# Patient Record
Sex: Male | Born: 1965 | Race: White | Hispanic: No | Marital: Married | Smoking: Former smoker
Health system: Western US, Academic
[De-identification: ages and names within clinical notes are randomized; demographics above are authoritative.]

## PROBLEM LIST (undated history)

## (undated) DIAGNOSIS — M47817 Spondylosis without myelopathy or radiculopathy, lumbosacral region: Secondary | ICD-10-CM

## (undated) DIAGNOSIS — G8929 Other chronic pain: Secondary | ICD-10-CM

## (undated) DIAGNOSIS — M1990 Unspecified osteoarthritis, unspecified site: Secondary | ICD-10-CM

## (undated) DIAGNOSIS — G4730 Sleep apnea, unspecified: Secondary | ICD-10-CM

## (undated) DIAGNOSIS — M549 Dorsalgia, unspecified: Secondary | ICD-10-CM

## (undated) DIAGNOSIS — I10 Essential (primary) hypertension: Secondary | ICD-10-CM

## (undated) DIAGNOSIS — M51379 Other intervertebral disc degeneration, lumbosacral region without mention of lumbar back pain or lower extremity pain: Secondary | ICD-10-CM

## (undated) DIAGNOSIS — M5137 Other intervertebral disc degeneration, lumbosacral region: Secondary | ICD-10-CM

## (undated) HISTORY — PX: SHOULDER SURGERY: SHX246

## (undated) HISTORY — DX: Essential (primary) hypertension: I10

## (undated) HISTORY — DX: Unspecified osteoarthritis, unspecified site: M19.90

## (undated) HISTORY — PX: POLYPECTOMY: SHX149

## (undated) HISTORY — DX: Other intervertebral disc degeneration, lumbosacral region without mention of lumbar back pain or lower extremity pain: M51.379

## (undated) HISTORY — DX: Other intervertebral disc degeneration, lumbosacral region: M51.37

## (undated) HISTORY — DX: Dorsalgia, unspecified: M54.9

## (undated) HISTORY — PX: VASECTOMY: SHX75

## (undated) HISTORY — DX: Other chronic pain: G89.29

## (undated) HISTORY — DX: Sleep apnea, unspecified: G47.30

## (undated) HISTORY — DX: Spondylosis without myelopathy or radiculopathy, lumbosacral region: M47.817

## (undated) HISTORY — PX: COLONOSCOPY: SHX174

---

## 1986-09-27 HISTORY — PX: KNEE SURGERY: SHX244

## 2002-06-01 ENCOUNTER — Encounter: Admission: RE | Admit: 2002-06-01 | Discharge: 2002-06-01 | Payer: Self-pay | Admitting: Family Medicine

## 2002-06-01 ENCOUNTER — Encounter: Payer: Self-pay | Admitting: Family Medicine

## 2002-09-17 ENCOUNTER — Encounter: Payer: Self-pay | Admitting: Neurosurgery

## 2002-09-17 ENCOUNTER — Encounter: Admission: RE | Admit: 2002-09-17 | Discharge: 2002-09-17 | Payer: Self-pay | Admitting: Neurosurgery

## 2002-10-01 ENCOUNTER — Encounter: Payer: Self-pay | Admitting: Neurosurgery

## 2002-10-01 ENCOUNTER — Encounter: Admission: RE | Admit: 2002-10-01 | Discharge: 2002-10-01 | Payer: Self-pay | Admitting: Neurosurgery

## 2002-10-15 ENCOUNTER — Encounter: Admission: RE | Admit: 2002-10-15 | Discharge: 2002-10-15 | Payer: Self-pay | Admitting: Neurosurgery

## 2002-10-15 ENCOUNTER — Encounter: Payer: Self-pay | Admitting: Neurosurgery

## 2003-10-09 ENCOUNTER — Encounter: Admission: RE | Admit: 2003-10-09 | Discharge: 2003-10-09 | Payer: Self-pay | Admitting: Neurosurgery

## 2003-10-24 ENCOUNTER — Encounter: Admission: RE | Admit: 2003-10-24 | Discharge: 2003-10-24 | Payer: Self-pay | Admitting: Neurosurgery

## 2003-11-08 ENCOUNTER — Encounter: Admission: RE | Admit: 2003-11-08 | Discharge: 2003-11-08 | Payer: Self-pay | Admitting: Neurosurgery

## 2004-07-12 ENCOUNTER — Emergency Department (HOSPITAL_COMMUNITY): Admission: EM | Admit: 2004-07-12 | Discharge: 2004-07-12 | Payer: Self-pay | Admitting: Emergency Medicine

## 2004-09-27 HISTORY — PX: KNEE SURGERY: SHX244

## 2004-11-24 ENCOUNTER — Ambulatory Visit: Payer: Self-pay | Admitting: Family Medicine

## 2004-11-25 ENCOUNTER — Encounter: Admission: RE | Admit: 2004-11-25 | Discharge: 2004-11-25 | Payer: Self-pay | Admitting: Family Medicine

## 2004-12-08 ENCOUNTER — Ambulatory Visit: Payer: Self-pay | Admitting: Family Medicine

## 2005-05-25 ENCOUNTER — Ambulatory Visit: Payer: Self-pay | Admitting: Family Medicine

## 2005-06-24 ENCOUNTER — Ambulatory Visit: Payer: Self-pay | Admitting: Family Medicine

## 2005-12-16 ENCOUNTER — Ambulatory Visit: Payer: Self-pay | Admitting: Family Medicine

## 2005-12-30 ENCOUNTER — Ambulatory Visit: Payer: Self-pay | Admitting: Family Medicine

## 2006-06-15 ENCOUNTER — Ambulatory Visit: Payer: Self-pay | Admitting: Family Medicine

## 2006-08-04 ENCOUNTER — Ambulatory Visit: Payer: Self-pay | Admitting: Family Medicine

## 2006-09-01 ENCOUNTER — Ambulatory Visit: Payer: Self-pay | Admitting: Family Medicine

## 2006-09-28 ENCOUNTER — Encounter: Payer: Self-pay | Admitting: Family Medicine

## 2006-09-28 ENCOUNTER — Ambulatory Visit (HOSPITAL_BASED_OUTPATIENT_CLINIC_OR_DEPARTMENT_OTHER): Admission: RE | Admit: 2006-09-28 | Discharge: 2006-09-28 | Payer: Self-pay | Admitting: Family Medicine

## 2006-10-06 ENCOUNTER — Ambulatory Visit: Payer: Self-pay | Admitting: Pulmonary Disease

## 2006-12-01 ENCOUNTER — Ambulatory Visit: Payer: Self-pay | Admitting: Family Medicine

## 2007-02-03 ENCOUNTER — Encounter: Payer: Self-pay | Admitting: Family Medicine

## 2007-05-30 ENCOUNTER — Telehealth: Payer: Self-pay | Admitting: Family Medicine

## 2007-06-06 ENCOUNTER — Ambulatory Visit: Payer: Self-pay | Admitting: Family Medicine

## 2007-06-06 DIAGNOSIS — R0609 Other forms of dyspnea: Secondary | ICD-10-CM

## 2007-06-06 DIAGNOSIS — R0989 Other specified symptoms and signs involving the circulatory and respiratory systems: Secondary | ICD-10-CM

## 2007-07-05 ENCOUNTER — Ambulatory Visit: Payer: Self-pay | Admitting: Pulmonary Disease

## 2007-11-21 ENCOUNTER — Ambulatory Visit: Payer: Self-pay | Admitting: Family Medicine

## 2007-11-21 DIAGNOSIS — F341 Dysthymic disorder: Secondary | ICD-10-CM | POA: Insufficient documentation

## 2008-04-01 ENCOUNTER — Ambulatory Visit: Payer: Self-pay | Admitting: Family Medicine

## 2008-05-20 ENCOUNTER — Telehealth: Payer: Self-pay | Admitting: Family Medicine

## 2008-05-20 ENCOUNTER — Ambulatory Visit: Payer: Self-pay | Admitting: Family Medicine

## 2008-12-05 ENCOUNTER — Ambulatory Visit: Payer: Self-pay | Admitting: Family Medicine

## 2008-12-12 ENCOUNTER — Ambulatory Visit: Payer: Self-pay | Admitting: Family Medicine

## 2008-12-15 LAB — CONVERTED CEMR LAB
Alkaline Phosphatase: 48 units/L (ref 39–117)
Bilirubin Urine: NEGATIVE
Bilirubin, Direct: 0.1 mg/dL (ref 0.0–0.3)
Eosinophils Absolute: 0.3 10*3/uL (ref 0.0–0.7)
GFR calc Af Amer: 94 mL/min
GFR calc non Af Amer: 78 mL/min
HCT: 45.3 % (ref 39.0–52.0)
HDL: 33.3 mg/dL — ABNORMAL LOW (ref >39.0)
Hemoglobin, Urine: NEGATIVE
MCV: 89.9 fL (ref 78.0–100.0)
Monocytes Absolute: 0.4 10*3/uL (ref 0.1–1.0)
Neutrophils Relative %: 66 % (ref 43.0–77.0)
Nitrite: NEGATIVE
Platelets: 191 10*3/uL (ref 150–400)
Potassium: 4.7 meq/L (ref 3.5–5.1)
RDW: 12.6 % (ref 11.5–14.6)
Sodium: 144 meq/L (ref 135–145)
Total Bilirubin: 1 mg/dL (ref 0.3–1.2)
Total Protein, Urine: NEGATIVE mg/dL
Triglycerides: 256 mg/dL (ref 0–149)
Urine Glucose: NEGATIVE mg/dL
Urobilinogen, UA: 0.2 (ref 0.0–1.0)
VLDL: 51 mg/dL — ABNORMAL HIGH (ref 0–40)

## 2009-06-03 ENCOUNTER — Ambulatory Visit: Payer: Self-pay | Admitting: Family Medicine

## 2009-06-03 DIAGNOSIS — M722 Plantar fascial fibromatosis: Secondary | ICD-10-CM

## 2009-06-03 DIAGNOSIS — M1990 Unspecified osteoarthritis, unspecified site: Secondary | ICD-10-CM | POA: Insufficient documentation

## 2009-06-03 DIAGNOSIS — IMO0002 Reserved for concepts with insufficient information to code with codable children: Secondary | ICD-10-CM

## 2009-06-03 DIAGNOSIS — G4730 Sleep apnea, unspecified: Secondary | ICD-10-CM

## 2009-09-24 ENCOUNTER — Ambulatory Visit: Payer: Self-pay | Admitting: Family Medicine

## 2009-09-24 DIAGNOSIS — R599 Enlarged lymph nodes, unspecified: Secondary | ICD-10-CM | POA: Insufficient documentation

## 2009-09-24 DIAGNOSIS — M954 Acquired deformity of chest and rib: Secondary | ICD-10-CM | POA: Insufficient documentation

## 2009-09-30 ENCOUNTER — Telehealth: Payer: Self-pay | Admitting: Family Medicine

## 2009-10-03 ENCOUNTER — Ambulatory Visit: Payer: Self-pay | Admitting: Family Medicine

## 2009-10-24 ENCOUNTER — Emergency Department (HOSPITAL_COMMUNITY): Admission: EM | Admit: 2009-10-24 | Discharge: 2009-10-24 | Payer: Self-pay | Admitting: Emergency Medicine

## 2009-12-10 ENCOUNTER — Ambulatory Visit: Payer: Self-pay | Admitting: Family Medicine

## 2009-12-10 LAB — CONVERTED CEMR LAB
Bilirubin Urine: NEGATIVE
Blood in Urine, dipstick: NEGATIVE
Glucose, Urine, Semiquant: NEGATIVE
Ketones, urine, test strip: NEGATIVE
Protein, U semiquant: NEGATIVE
pH: 6

## 2009-12-16 ENCOUNTER — Ambulatory Visit: Payer: Self-pay | Admitting: Family Medicine

## 2009-12-16 LAB — CONVERTED CEMR LAB
BUN: 15 mg/dL (ref 6–23)
Basophils Absolute: 0 10*3/uL (ref 0.0–0.1)
Bilirubin, Direct: 0 mg/dL (ref 0.0–0.3)
Chloride: 108 meq/L (ref 96–112)
Cholesterol: 217 mg/dL — ABNORMAL HIGH (ref 0–200)
Creatinine, Ser: 1 mg/dL (ref 0.4–1.5)
Direct LDL: 118.1 mg/dL
Eosinophils Absolute: 0.2 10*3/uL (ref 0.0–0.7)
Eosinophils Relative: 3 % (ref 0.0–5.0)
MCHC: 34.7 g/dL (ref 30.0–36.0)
MCV: 91.7 fL (ref 78.0–100.0)
Monocytes Absolute: 0.5 10*3/uL (ref 0.1–1.0)
Neutrophils Relative %: 66.3 % (ref 43.0–77.0)
Platelets: 204 10*3/uL (ref 150.0–400.0)
Total Bilirubin: 0.8 mg/dL (ref 0.3–1.2)
VLDL: 83.2 mg/dL — ABNORMAL HIGH (ref 0.0–40.0)
WBC: 7.6 10*3/uL (ref 4.5–10.5)

## 2010-09-24 ENCOUNTER — Ambulatory Visit: Payer: Self-pay | Admitting: Family Medicine

## 2010-09-24 DIAGNOSIS — L919 Hypertrophic disorder of the skin, unspecified: Secondary | ICD-10-CM

## 2010-09-24 DIAGNOSIS — M545 Low back pain, unspecified: Secondary | ICD-10-CM | POA: Insufficient documentation

## 2010-09-24 DIAGNOSIS — L909 Atrophic disorder of skin, unspecified: Secondary | ICD-10-CM | POA: Insufficient documentation

## 2010-09-24 DIAGNOSIS — M7710 Lateral epicondylitis, unspecified elbow: Secondary | ICD-10-CM | POA: Insufficient documentation

## 2010-10-27 NOTE — Assessment & Plan Note (Signed)
Summary: CPX // RS   Vital Signs:  Patient profile:   45 year old male Height:      75.5 inches Weight:      279 pounds BMI:     34.54 O2 Sat:      98 % Temp:     98.1 degrees F Pulse rate:   71 / minute BP sitting:   120 / 80  (left arm) Cuff size:   large  Vitals Entered By: Pura Spice, RN (December 16, 2009 8:33 AM) CC: cpx rt eye hemorrhage since last friday. talk about lexapro states  left  fx fibula about 8 wks ago tripped over log splitter.  Is Patient Diabetic? No   History of Present Illness: this 45 year old white married male and a for a complete physical examination and to discuss his medical problems. 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 He has had some family stresses with anxiety and panic episodes which have been relieved with alprazolam in the past but he does not utilize any at this time. We have continued to take Lexapro,which we are to change to citalopram 40 mg due  to financial reasons He was referred for a sleep study because of snoring and possible sleep apnea and was treated with a CPAP which had been her satisfactory He complains of some joint pain in her right knee which is relieved without approach and a milligram t.i.d. Plantar fasciitis in the past but has been doing stretching exercises and has been doing well Relates he noticed redness in her right eye this a.m.  Allergies (verified): No Known Drug Allergies  Past History:  Past Surgical History: Last updated: 02/03/2007 Vasectomy  Risk Factors: Smoking Status: quit (02/03/2007)  Past Medical History: Unremarkable chronic back pain  Review of Systems  The patient denies anorexia, fever, weight loss, weight gain, vision loss, decreased hearing, hoarseness, chest pain, syncope, dyspnea on exertion, peripheral edema, prolonged cough, headaches, hemoptysis, abdominal pain,  melena, hematochezia, severe indigestion/heartburn, hematuria, incontinence, genital sores, muscle weakness, suspicious skin lesions, transient blindness, difficulty walking, depression, unusual weight change, abnormal bleeding, enlarged lymph nodes, angioedema, breast masses, and testicular masses.    Physical Exam  General:  Well-developed,well-nourished,in no acute distress; alert,appropriate and cooperative throughout examinationoverweight-appearing.   Head:  Normocephalic and atraumatic without obvious abnormalities. No apparent alopecia or balding. Eyes:  gynecology hemorrhage or otherwise normal Ears:  External ear exam shows no significant lesions or deformities.  Otoscopic examination reveals clear canals, tympanic membranes are intact bilaterally without bulging, retraction, inflammation or discharge. Hearing is grossly normal bilaterally. Nose:  nasal septum deviated her weight passages close Mouth:  Oral mucosa and oropharynx without lesions or exudates.  Teeth in good repair. Neck:  No deformities, masses, or tenderness noted. Chest Wall:  No deformities, masses, tenderness or gynecomastia noted. Breasts:  No masses or gynecomastia noted Lungs:  Normal respiratory effort, chest expands symmetrically. Lungs are clear to auscultation, no crackles or wheezes. Heart:  Normal rate and regular rhythm. S1 and S2 normal without gallop, murmur, click, rub or other extra sounds. Abdomen:  Bowel sounds positive,abdomen soft and non-tender without masses, organomegaly or hernias noted. Rectal:  No external abnormalities noted. Normal sphincter tone. No rectal masses or tenderness. Genitalia:  Testes bilaterally descended without nodularity, tenderness or masses. No scrotal masses or lesions. No penis lesions or urethral discharge. Prostate:  Prostate gland  firm and smooth, no enlargement, nodularity, tenderness, mass, asymmetry or induration. Msk:  No deformity or scoliosis noted of thoracic or  lumbar spine.   Pulses:  R and L carotid,radial,femoral,dorsalis pedis and posterior tibial pulses are full and equal bilaterally Extremities:  No clubbing, cyanosis, edema, or deformity noted with normal full range of motion of all joints.   Neurologic:  No cranial nerve deficits noted. Station and gait are normal. Plantar reflexes are down-going bilaterally. DTRs are symmetrical throughout. Sensory, motor and coordinative functions appear intact. Skin:  Intact without suspicious lesions or rashes Cervical Nodes:  No lymphadenopathy noted Axillary Nodes:  No palpable lymphadenopathy Inguinal Nodes:  No significant adenopathy Psych:  Cognition and judgment appear intact. Alert and cooperative with normal attention span and concentration. No apparent delusions, illusions, hallucinations   Impression & Recommendations:  Problem # 1:  PHYSICAL EXAMINATION (ICD-V70.0) Assessment New  Orders: EKG w/ Interpretation (93000)normal EKG  Problem # 2:  SLEEP APNEA (ICD-780.57) Assessment: Improved  Problem # 3:  FASCIITIS, PLANTAR (ICD-728.71) Assessment: Improved  His updated medication list for this problem includes:    Ibuprofen 800 Mg Tabs (Ibuprofen) .[name]... 1 three times a day pc for arthrits knee and  back stretching exercises on a regular basis  Problem # 4:  ARTHRITIS, RIGHT KNEE (ICD-716.96) Assessment: Unchanged  Problem # 5:  ANXIETY DEPRESSION (ICD-300.4) Assessment: Improved citalopram 40 mg q. day  Problem # 6:  SNORING (ICD-786.09) Assessment: Improved  Problem # 7:  BACK PAIN (ICD-724.5) Assessment: Improved  His updated medication list for this problem includes:    Lorcet 10/650 10-650 Mg Tabs (Hydrocodone-acetaminophen) .[name]... 1  by mouth every 4-6 hrs as needed pain  not to exceed 4 per day    Ibuprofen 800 Mg Tabs (Ibuprofen) .[name]... 1 three times a day pc for arthrits knee and  back  Complete Medication List: 1)  Lorcet 10/650 10-650 Mg Tabs  (Hydrocodone-acetaminophen) .[name].. 1  by mouth every 4-6 hrs as needed pain  not to exceed 4 per day 2)  Ibuprofen 800 Mg Tabs (Ibuprofen) .[name].. 1 three times a day pc for arthrits knee and  back 3)  Citalopram Hydrobromide 20 Mg Tabs (Citalopram hydrobromide) .[name].. 1 qd  Patient Instructions: 1)  James Lexapro to citalopram 40 mg q. day 2)  Since you again from 265-7 days I recommend that you go on a weight reduction diet and lose weight., since she is so active that from her way to do this to decrease your caloric intake 3)  Continue medications as aprescribed 4)  as discussed her laboratory studies were good except for elevated triglycerides which will lower it to decrease her sweets and caloric intake Prescriptions: IBUPROFEN 800 MG TABS (IBUPROFEN) 1 three times a day pc for arthrits knee and  back  #90 x 11   Entered and Authorized by:   Judithann Sheen MD   Signed by:   Judithann Sheen MD on 12/16/2009   Method used:   Electronically to        [id number]* (retail)       [address]       [place], [place]  [address]       Ph: [phone] or [phone]       Fax: [phone]   RxID:   [id number] CITALOPRAM HYDROBROMIDE 20 MG TABS (CITALOPRAM HYDROBROMIDE) 1 qd  #30 x 11   Entered and  Authorized by:   Judithann Sheen MD   Signed by:   Judithann Sheen MD on 12/16/2009   Method used:   Electronically to        [id number]* (retail)       [address]       [place], [place]  [address]       Ph: [phone] or [phone]       Fax: [phone]   RxID:   [id number] LORCET 10/650 10-650 MG  TABS (HYDROCODONE-ACETAMINOPHEN) 1  by mouth every 4-6 hrs as needed pain  not to exceed 4 per day  #120 x 5   Entered and Authorized by:   Judithann Sheen MD   Signed by:   Judithann Sheen MD on 12/16/2009   Method used:   Print then Give to Patient   RxID:    [id number]

## 2010-10-27 NOTE — Progress Notes (Signed)
Summary: lymph node swollen more & irritated  Phone Note Call from Patient Call back at [phone]   Summary of Call: Lymph node under right arm swollen more & irritated.  Saw Dr. Scotty Court last week.  Make another appt or what?  Patient called again & left voicemail same info.  Lymph node more swollen & significantly tender.  Triage called & left message that we don't have answer yet.   Rudy Jew, RN  September 30, 2009 2:56 PM  Initial call taken by: Rudy Jew, RN,  September 30, 2009 12:36 PM  Follow-up for Phone Call        per dr Scotty Court he called pt and sent in cipro  . Follow-up by: Pura Spice, RN,  October 01, 2009 7:42 AM    New/Updated Medications: CIPROFLOXACIN HCL 500 MG TABS (CIPROFLOXACIN HCL) 1 by mouth two times a day Prescriptions: CIPROFLOXACIN HCL 500 MG TABS (CIPROFLOXACIN HCL) 1 by mouth two times a day  #20 x 0   Entered by:   Pura Spice, RN   Authorized by:   Judithann Sheen MD   Signed by:   Pura Spice, RN on 10/01/2009   Method used:   Telephoned to ...       [facility]  [address] [id number] (retail)       [address]       [place], [place]  [address]       Ph: [phone] or [phone]       Fax: [phone]   RxID:   [id number]

## 2010-10-29 NOTE — Assessment & Plan Note (Signed)
Summary: BACK PAIN, L ELBOW PAIN - OK DR STAFFORD? 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 His emotional stress and strain with his family has greatly improved and he does not need the Celexa and a longer He is also stopped taking ibuprofen was needing and back over 130/118. New complaint is that of pain in the left elbow lateral aspect of the elbow Otherwise doing fine,except as noted he is staying approximately 10 pounds in related that he should go on a weight reduction diet relates he has several small skin tags under both arms in the axial  Allergies: No Known Drug Allergies  Past History:  Past Medical History: Last updated: 12/16/2009 Unremarkable chronic back pain  Past Surgical History: Last updated: 02/03/2007 Vasectomy  Risk Factors: Smoking Status: quit (02/03/2007)  Physical Exam  General:  Well-developed,well-nourished,in no acute distress; alert,appropriate and cooperative throughout examination Chest Wall:  left sternoclavicular door appears to been displaced in the past and is larger slightly lower than the right sternoclavicular joint Lungs:  Normal respiratory effort, chest expands symmetrically. Lungs are clear to auscultation, no crackles or wheezes. Heart:  Normal rate and regular rhythm. S1 and S2 normal without gallop, murmur, click, rub or  other extra sounds. Msk:  No deformity or scoliosis noted of thoracic or lumbar spine.   S. Mark tenderness over the left epicondyle region  Extremities:  No clubbing, cyanosis, edema, or deformity noted with normal full range of motion of all joints.   Skin:  patient has a left 4 small skin tags in the right axilla and 3 in the left axilla which can be removed surgically by removing with scissors and silver nitrate   Impression & Recommendations:  Problem # 1:  SKIN TAG (ICD-701.9) Assessment New removed approximately 8 skin tags with scissors and then applied silver nitrate  Problem # 2:  BACK PAIN, LUMBAR, CHRONIC (ICD-724.2) Assessment: Improved  His updated medication list for this problem includes:    Lorcet 10/650 10-650 Mg Tabs (Hydrocodone-acetaminophen) .[name]... 1  by mouth every 4-6 hrs as needed pain  not to exceed 4 per day, refill on schedule    Ibuprofen 800 Mg Tabs (Ibuprofen) .[name]... 1 three times a day pc for arthrits knee and  back  Problem # 3:  LATERAL EPICONDYLITIS, LEFT (ICD-726.32) Assessment: New redness on decreasing dosage tennis elbow strap  Problem # 4:  SLEEP APNEA (ICD-780.57) Assessment: Improved CPAP  Problem # 5:  FASCIITIS, PLANTAR (ICD-728.71) Assessment: Improved  His updated medication list for this problem includes:    Ibuprofen 800 Mg Tabs (Ibuprofen) .[name]... 1 three times a day pc for arthrits knee and  back  Problem # 6:  ARTHRITIS, RIGHT KNEE (ICD-716.96) Assessment: Improved  Complete Medication List: 1)  Lorcet 10/650 10-650 Mg Tabs (Hydrocodone-acetaminophen) .[name].. 1  by mouth every 4-6 hrs as needed pain  not to exceed 4 per day, refill on schedule 2)  Ibuprofen 800 Mg Tabs (Ibuprofen) .[name].. 1 three times a day pc for arthrits knee and  back 3)  Citalopram Hydrobromide 20 Mg Tabs (Citalopram hydrobromide) .[name].. 1 qd 4)  Prednisone 20 Mg Tabs (Prednisone) .[name].. 1 tidpc for 4 days then 1 two times a day for7 days then 1 once daily  for  tendinitis  Patient Instructions: 1)  DIAGNOSIS OF LATERAL EPICONDYLITIS LEFT ELBOW 2)  prednisone decreasing dosage plus getting a tennis elbow splint or band when doing lifting or repetitve activities 3)  will refill hydrocodone 4)  skin polp areas should be no problem Prescriptions: PREDNISONE 20 MG TABS (PREDNISONE) 1 tidpc for 4 days then 1 two times a day for7 days then 1 once daily  for tendinitis  #36 x 1   Entered and Authorized by:   Judithann Sheen MD   Signed by:   Judithann Sheen MD on 09/24/2010   Method used:   Electronically to        [id number]* (retail)       [address]       [place], [place]  [address]       Ph: [phone] or [phone]       Fax: [phone]   RxID:   [id number] LORCET 10/650 10-650 MG  TABS (HYDROCODONE-ACETAMINOPHEN) 1  by mouth every 4-6 hrs as needed pain  not to exceed 4 per day, refill on schedule  #120 x 5   Entered and Authorized by:   Judithann Sheen MD   Signed by:   Judithann Sheen MD on 09/24/2010   Method used:   Print then Give to Patient   RxID:   [id number]    Orders Added: 1)  Est. Patient Level IV [95188]

## 2011-02-09 NOTE — Assessment & Plan Note (Signed)
[facility]                             PULMONARY OFFICE NOTE   BRASEN, BUNDREN                       MRN:          [id number]  DATE:07/05/2007                            DOB:          08-Apr-1966    HISTORY OF PRESENT ILLNESS:  Patient is a 45 year old white male who I  have been asked to see for sleeping difficulties.  The patient states  that he has been a snorer all of his life but this has greatly worsened  recently.  Patient is noted to have pauses in his breathing during sleep  according to his wife, he does admit to choking arousals.  He believes  that things are definitely worse while on his back.  Patient typically  gets to bed between 9 and 10 and gets up at 5 a.m. to start his day.  He  will awaken with a raw, dry mouth and does not feel rested.  He has  frequent awakenings during the night for unknown reasons.  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  He has no frank sleepiness just worsening fuzziness.  He has definite dozing with TV and movies in the evenings.  He has no  difficulties with driving.  Patient does admit that he has significant  nasal stuffiness and congestion and is somewhat improved with Nasacort  but not totally relieved.   PAST MEDICAL HISTORY:  Spine disk disease, otherwise noncontributory.   CURRENT MEDICATIONS:  1. Alprazolam 0.25 mg nightly.  2. Hydroxycodone for back pain p.r.n.  3. Lexapro 10 mg daily.   PATIENT HAS NO KNOWN DRUG ALLERGIES.   SOCIAL HISTORY:  Is married and has children.  Has a history of smoking  1 pack per day for 20 years.  He has not smoked since 2004.   FAMILY HISTORY:  Remarkable for his father having emphysema, otherwise  it is unremarkable in first degree relatives.   REVIEW OF SYSTEMS:  As per history of present illness, also see patient  intake form documented in the chart.   PHYSICAL EXAMINATION:  GENERAL:  He is  an overweight male in no acute  distress, blood pressure is 132/86, pulse 83, temperature is 98, weight  is 264 pounds, he is 6 foot 3-1/2 inches tall, O2 saturation on room air  is 94%.  HEENT:  Pupils equal, round, reactive to light and accommodation;  extraocular muscles are intact.  Nares shows deviated septum to the  right with obstruction.  His left naris is narrowed and has turbinate  hypertrophy.  Oropharynx shows elongation of his soft palate and uvula.  NECK:  Supple without JVD or lymphadenopathy.  There is no palpitations  thyromegaly.  CHEST:  Totally clear.  CARDIAC:  Reveals regular rate and rhythm, no murmurs, rubs or gallops.  ABDOMEN:  Soft, nontender with good bowel sounds.  Genital exam, rectal exam, breast exam was not done and not indicated.  LOWER EXTREMITIES:  Without edema, pulses were intact distally.  NEUROLOGIC:  Alert and oriented with no obvious motor deficits.  LABORATORY DATA:  Patient underwent nocturnal polysomnography on January  of 2008 where he was found to have 4 apneas and 6 hypopneas for  respiratory disturbance index of 1.5 events per hour.  There was loud  snoring noted throughout.  Patient also had large numbers of nonspecific  arousals which is suggestive of the upper airway resistance syndrome.   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  The patient  gives a classic history for this, has nocturnal polysomnography which  suggests this as well, and has abnormal upper airway anatomy.  I have  discussed the various ways that we can deal with this including weight  loss, upper airway surgery, oral appliance and also continuous positive  airway pressure if we are able to get it approved by insurance.  The  patient is not a candidate for oral appliance because of his significant  nasal obstruction.  We will start him back on Nasacort but this did not  relieve his symptoms in the past.  I do think that we ought to look at 2   possible treatments.  One is to consider nasopharyngeal surgery while he  is losing weight.  The other is to consider continuous positive airway  pressure which I think would be quite effective for him.  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  Even when the patient's weight was lower he  continued to have this issue and therefore I do not think weight loss  alone is the best intervention.   PLAN:  1. Work on weight loss.  2. Nasacort 2 sprays in each naris daily.  3. Will refer to ENT for evaluation for possible surgery.  4. In the interim I really think that we need to try CPAP to see is      this will help him.  I believe we need to treat his sleep disorder      breathing aggressively in light of his occupation which may result      in high risk if he falls asleep.  Patient is agreeable to this      approach.     Barbaraann Share, MD,FCCP  Electronically Signed    [facility]  DD: 07/06/2007  DT: 07/06/2007  Job #: [id number]   cc:   Ellin Saba., MD  [facility]

## 2011-02-12 NOTE — Procedures (Signed)
NAME:  Dylan Hatfield, Dylan Hatfield NO.:  [id number]   MEDICAL RECORD NO.:  [id number]          PATIENT TYPE:  OUT   LOCATION:  SLEEP CENTER                 FACILITY:  [facility]   PHYSICIAN:  Barbaraann Share, MD,FCCPDATE OF BIRTH:  1964/11/27   DATE OF STUDY:  09/28/2006                            NOCTURNAL POLYSOMNOGRAM   REFERRING PHYSICIAN:  Tawny Asal MD   INDICATION FOR STUDY:  Hypersomnia with sleep apnea.   EPWORTH SLEEPINESS SCORE:  11.   SLEEP ARCHITECTURE:  The patient had a total sleep time of 438 minutes  with 84 minutes of REM and never achieved slow-wave sleep.  Sleep onset  latency was normal, as was REM onset.  Sleep efficiency was fairly good  at 92%.   RESPIRATORY DATA:  The patient was found to have 4 apneas and 6  hypopneas for a respiratory disturbance index of 1.5 events per hour.  The events primarily occurred in the supine position, and there was loud  snoring noted throughout.  It should be noted that the patient had 106  nonspecific arousals during the night as well.   OXYGEN DATA:  There was saturation as low as 86% with the obstructive  events.   CARDIAC DATA:  No clinically significant cardiac arrhythmias.   MOVEMENT-PARASOMNIA:  The patient was found to have 16 leg jerks with  very little sleep disruption.   IMPRESSIONS-RECOMMENDATIONS:  Small numbers of obstructive events which  do not meet the respiratory disturbance criteria for the obstructive  sleep apnea syndrome.  The patient did, however, have loud snoring and  very large numbers of nonspecific arousals, which is suggestive of the  upper airway resistance syndrome.  This is a pre-sleep apnea condition  that may be treated with weight loss alone if applicable, upper airway  surgery, or appliance, or possibly CPAP.  Clinical correlation is  suggested.      Barbaraann Share, MD,FCCP  Diplomate, American Board of Sleep  Medicine     [facility]  D:  10/06/2006 17:44:17  T:   10/07/2006 06:52:45  Job:  [id number]

## 2011-03-01 ENCOUNTER — Other Ambulatory Visit: Payer: Self-pay | Admitting: Family Medicine

## 2011-03-19 ENCOUNTER — Encounter: Payer: Self-pay | Admitting: Family Medicine

## 2011-03-24 ENCOUNTER — Encounter: Payer: Self-pay | Admitting: Family Medicine

## 2011-03-24 ENCOUNTER — Ambulatory Visit (INDEPENDENT_AMBULATORY_CARE_PROVIDER_SITE_OTHER): Payer: BC Managed Care – PPO | Admitting: Family Medicine

## 2011-03-24 ENCOUNTER — Ambulatory Visit (INDEPENDENT_AMBULATORY_CARE_PROVIDER_SITE_OTHER)
Admission: RE | Admit: 2011-03-24 | Discharge: 2011-03-24 | Disposition: A | Discharge status: Home / Self Care | Payer: BC Managed Care – PPO | Source: Ambulatory Visit | Attending: Family Medicine | Admitting: Family Medicine

## 2011-03-24 VITALS — BP 112/72 | HR 76 | Temp 98.2°F | Wt 262.0 lb

## 2011-03-24 DIAGNOSIS — M19079 Primary osteoarthritis, unspecified ankle and foot: Secondary | ICD-10-CM

## 2011-03-24 DIAGNOSIS — M25579 Pain in unspecified ankle and joints of unspecified foot: Secondary | ICD-10-CM

## 2011-03-24 MED ORDER — HYDROCODONE-ACETAMINOPHEN 10-650 MG PO TABS: ORAL_TABLET | ORAL | Status: DC

## 2011-03-24 MED ORDER — TERBINAFINE HCL 250 MG PO TABS: 250.0000 mg | ORAL_TABLET | Freq: Every day | ORAL | Status: DC

## 2011-03-24 MED ORDER — DOXYCYCLINE HYCLATE 100 MG PO TABS: 100.0000 mg | ORAL_TABLET | Freq: Two times a day (BID) | ORAL | Status: AC

## 2011-03-27 ENCOUNTER — Encounter: Payer: Self-pay | Admitting: Family Medicine

## 2011-03-27 NOTE — Progress Notes (Signed)
  Subjective:    Patient ID: Dylan Hatfield, male    DOB: [birth year], 45 y.o.   MRN: [id number] this is a 45 year old white married male is in today complaining of pain on the left ankle over the past week this followed an episode in which he twisted his ankle and put considerable pressure on the ankle. He's concerned since he had a left lower fracture of the left tibia December 2010 treated by Dr. Lestine Box Next is the patient has lost from 275-62 and looked for goodHe continues to have a chronic back pain and right knee pain since starting his CPAP for sleep apnea he has stopped snoring his left lateral epicondylitis and unresolved he does continue to have chronic lumbar back painHPI    Review of Systemssee history of present illness     Objective:   Physical Examthe patient is well-developed well-nourished pleasant cough due to white male who is complaining of pain in his left ankle Heart-lung no abnormalities noted Examination right ankle was normal, examination of the left ankle reveals it to be slightly swollen tenderness is not over the bone no blood is over the interspace of the ankle indicating inflammation no fracture        Assessment & Plan:  My impression is that he has a acute sprain or strain of the left ankle but no fracture I have her we'll send him to lumbar x-ray on [address] for ankle x-ray Plan to treat with t a neoprene support and minimize activity also 2 use cold pack 20-30 minutes and then apply heating pad 20 a 30 minute Already has analgesic of hydrocodone which she can use for pain when needed also to continue ibuprofen 800 mg 3 times daily

## 2011-03-27 NOTE — Patient Instructions (Addendum)
I am going to send you [facility] on [address] for a xray of left ankle My impression is that you have an acute strain sprain of the left ankle with no fracture however we will check on this If there is no fracture we'll treat her with a neoprene support if at all possible minimize being on ankle If possible use cold packs 20-30 minutes followed by heat 20-30 minutes 2-3 times per day Take hydrocodone as needed If there is a fracture I will refer you to an orthopedist, Dr. Lestine Box Today we'll give you an injection of Depo-Medrol 120 mg IM

## 2011-03-30 DIAGNOSIS — M19079 Primary osteoarthritis, unspecified ankle and foot: Secondary | ICD-10-CM

## 2011-03-30 MED ORDER — METHYLPREDNISOLONE ACETATE 80 MG/ML IJ SUSP
120.0000 mg | Freq: Once | INTRAMUSCULAR | Status: AC
  Administered 2011-03-30: 120 mg via INTRAMUSCULAR

## 2011-03-30 NOTE — Progress Notes (Signed)
Addended by: Azucena Freed on: 03/30/2011 03:17 PM   Modules accepted: Orders

## 2011-04-02 ENCOUNTER — Other Ambulatory Visit: Payer: Self-pay | Admitting: Family Medicine

## 2011-08-24 ENCOUNTER — Other Ambulatory Visit: Payer: Self-pay | Admitting: *Deleted

## 2011-08-24 MED ORDER — LEVOTHYROXINE SODIUM 200 MCG PO TABS: 200.0000 ug | ORAL_TABLET | Freq: Every day | ORAL | Status: DC

## 2011-09-03 ENCOUNTER — Telehealth: Payer: Self-pay

## 2011-09-03 NOTE — Telephone Encounter (Signed)
Synthroid was sent in for pt and he does not have thyroid disease.  Rx canceled and pt is aware.

## 2011-09-07 ENCOUNTER — Ambulatory Visit (INDEPENDENT_AMBULATORY_CARE_PROVIDER_SITE_OTHER): Payer: BC Managed Care – PPO | Admitting: Internal Medicine

## 2011-09-07 VITALS — BP 120/82 | HR 72 | Temp 98.4°F | Ht 76.0 in | Wt 275.0 lb

## 2011-09-07 DIAGNOSIS — J029 Acute pharyngitis, unspecified: Secondary | ICD-10-CM

## 2011-09-07 DIAGNOSIS — M545 Low back pain: Secondary | ICD-10-CM

## 2011-09-07 DIAGNOSIS — J069 Acute upper respiratory infection, unspecified: Secondary | ICD-10-CM

## 2011-09-07 NOTE — Progress Notes (Signed)
  Subjective:    Patient ID: Dylan Hatfield, male    DOB: 1966-03-29, 45 y.o.   MRN: [id number]  HPI One-week history of sore throat, left ear ache, symptoms were increasing until today when he feels a slightly better.   Past Medical History  Diagnosis Date  . Chronic back pain   . Arthritis   . Sleep apnea       Review of Systems No fever or chills No nausea vomiting No chest congestion or cough    Objective:   Physical Exam  Constitutional: He appears well-developed and well-nourished. No distress.  HENT:  Head: Normocephalic and atraumatic.       Face is symmetric, not tender to palpation. Nose is slightly congested, throat without redness, tympanic membranes bulge but no redness or discharge  Cardiovascular: Normal rate, regular rhythm and normal heart sounds.   No murmur heard. Pulmonary/Chest: Effort normal and breath sounds normal. No respiratory distress. He has no wheezes. He has no rales.  Skin: He is not diaphoretic.       Assessment & Plan:  URI: See instructions, strep test was negative

## 2011-09-07 NOTE — Assessment & Plan Note (Signed)
No history of back pain, his primary doctor is retiring, needs a new PCP. He has a one-month supply of pain medication. Plan: Needs office visit within a month to get established. If unable to see me in one month, I will call one-month supply of pain medication when needed until he is able to come back

## 2011-09-07 NOTE — Patient Instructions (Addendum)
Rest, fluids , tylenol For cough, take Mucinex DM twice a day as needed  For congestion use Sudafed (pseudoephedrine) behind the counter 30 mg every 4 to 6 hours as needed Call if no better in few days Call anytime if the symptoms are severe  Please make an appointment to get establish

## 2011-09-20 ENCOUNTER — Ambulatory Visit: Payer: BC Managed Care – PPO | Admitting: Internal Medicine

## 2011-09-22 ENCOUNTER — Encounter: Payer: Self-pay | Admitting: Family Medicine

## 2011-09-24 ENCOUNTER — Ambulatory Visit (INDEPENDENT_AMBULATORY_CARE_PROVIDER_SITE_OTHER): Payer: BC Managed Care – PPO | Admitting: Internal Medicine

## 2011-09-24 ENCOUNTER — Encounter: Payer: Self-pay | Admitting: Internal Medicine

## 2011-09-24 VITALS — BP 128/84 | HR 69 | Temp 98.3°F | Ht 75.5 in | Wt 268.2 lb

## 2011-09-24 DIAGNOSIS — M545 Low back pain: Secondary | ICD-10-CM

## 2011-09-24 MED ORDER — HYDROCODONE-ACETAMINOPHEN 10-650 MG PO TABS: ORAL_TABLET | ORAL | Status: DC

## 2011-09-24 NOTE — Assessment & Plan Note (Addendum)
Long history of back pain, about 6 years ago he had a full evaluation included a MRI according to the patient, he also got some epidural injections (2005 per chart review). Lately, the treatment has consisted essentially of taking Lorcet 2 or 3 times a day. He also take ibuprofen as needed without apparent side effects. The pain is not as well-controlled as he would like, in the past he got a lot of benefit from physical therapy. We agreed to: Refill his medications Refer to physical therapy Work on weight loss

## 2011-09-24 NOTE — Progress Notes (Signed)
  Subjective:    Patient ID: Dylan Hatfield, male    DOB: 13-Jun-1966, 45 y.o.   MRN: [id number]  HPI Here to get established, previously saw Dr. Scotty Court. He has a long history of back pain, history summarized in the assessment and plan.  Past medical history Chronic back pain DJD-- knee Sleep apnea, on CPAP, dx ~ 2008   Past surgical history vasectomy   Social history Married, children x 2 Tobacco--no ETOH- no Occupation-- Radio broadcast assistant   Family history Diabetes-- CAD-- Stroke-- Colon cancer-- Breast cancer-- Prostate cancer--   Review of Systems  takes ibuprofen as needed, denies any nausea, vomiting. Denies any fever or chills No bladder or bowel incontinence Occasional paresthesias of the left toes which is a chronic problem    Objective:   Physical Exam  Constitutional: He is oriented to person, place, and time. He appears well-developed. No distress.  Cardiovascular: Normal rate, regular rhythm and normal heart sounds.   No murmur heard. Musculoskeletal:       Lower back is slightly tender to palpation, gait somehow antalgic  Neurological: He is alert and oriented to person, place, and time. He has normal reflexes.       Extremities strength symmetric.    Skin: He is not diaphoretic.      Assessment & Plan:  Today , I spent more than 25   min with the patient, >50% of the time counseling, and   reviewing the chart

## 2011-09-26 ENCOUNTER — Encounter: Payer: Self-pay | Admitting: Internal Medicine

## 2011-09-29 ENCOUNTER — Ambulatory Visit: Payer: BC Managed Care – PPO | Admitting: Internal Medicine

## 2012-01-21 ENCOUNTER — Ambulatory Visit (INDEPENDENT_AMBULATORY_CARE_PROVIDER_SITE_OTHER): Payer: BC Managed Care – PPO | Admitting: Internal Medicine

## 2012-01-21 DIAGNOSIS — G4730 Sleep apnea, unspecified: Secondary | ICD-10-CM

## 2012-01-21 DIAGNOSIS — M545 Low back pain: Secondary | ICD-10-CM

## 2012-01-21 DIAGNOSIS — Z0000 Encounter for general adult medical examination without abnormal findings: Secondary | ICD-10-CM

## 2012-01-21 LAB — LIPID PANEL
Cholesterol: 187 mg/dL (ref 0–200)
HDL: 36 mg/dL — ABNORMAL LOW (ref >39.00)
Total CHOL/HDL Ratio: 5
Triglycerides: 230 mg/dL — ABNORMAL HIGH (ref 0.0–149.0)
VLDL: 46 mg/dL — ABNORMAL HIGH (ref 0.0–40.0)

## 2012-01-21 LAB — COMPREHENSIVE METABOLIC PANEL
Albumin: 4.6 g/dL (ref 3.5–5.2)
BUN: 15 mg/dL (ref 6–23)
CO2: 26 mEq/L (ref 19–32)
GFR: 91.59 mL/min (ref >60.00)
Glucose, Bld: 98 mg/dL (ref 70–99)
Sodium: 140 mEq/L (ref 135–145)
Total Bilirubin: 0.8 mg/dL (ref 0.3–1.2)
Total Protein: 6.9 g/dL (ref 6.0–8.3)

## 2012-01-21 LAB — CBC WITH DIFFERENTIAL/PLATELET
Eosinophils Relative: 2.9 % (ref 0.0–5.0)
HCT: 48.2 % (ref 39.0–52.0)
Monocytes Relative: 5.1 % (ref 3.0–12.0)
Neutrophils Relative %: 65.6 % (ref 43.0–77.0)
Platelets: 220 10*3/uL (ref 150.0–400.0)
WBC: 8.1 10*3/uL (ref 4.5–10.5)

## 2012-01-21 MED ORDER — IBUPROFEN 800 MG PO TABS: 400.0000 mg | ORAL_TABLET | Freq: Three times a day (TID) | ORAL | Status: DC | PRN

## 2012-01-21 MED ORDER — HYDROCODONE-ACETAMINOPHEN 10-650 MG PO TABS: ORAL_TABLET | ORAL | Status: DC

## 2012-01-21 NOTE — Assessment & Plan Note (Signed)
Td 2010 Never had a cscope Labs Diet-exercise discussed

## 2012-01-21 NOTE — Assessment & Plan Note (Signed)
Plans to take his CPAP for service, advised to call me if he needs a referral to see a pulmonologist.

## 2012-01-21 NOTE — Progress Notes (Signed)
  Subjective:    Patient ID: MOHMMAD SALEEBY, male    DOB: 1966/05/29, 46 y.o.   MRN: [id number]  HPI CPX  Past medical history  Chronic back pain  DJD-- knee  Sleep apnea, on CPAP, dx ~ 2008   Past surgical history  vasectomy   Social history  Married, children x 2  Tobacco--quit 2003, used to smoke 1 ppd   ETOH--no  Occupation-- Radio broadcast assistant  Diet-- healthy most of the time   Family history  Diabetes-- no CAD-- no Stroke--  GM at age 76 Colon cancer-- no Breast cancer-- GM dx at age 61 Prostate cancer--no   Review of Systems  Constitutional: Negative for fever and fatigue.  Respiratory: Negative for cough and shortness of breath.   Cardiovascular: Negative for chest pain and leg swelling.  Gastrointestinal: Negative for abdominal pain and blood in stool.  Genitourinary: Negative for dysuria, frequency, hematuria and difficulty urinating.  Musculoskeletal:       Occ knee pain  Psychiatric/Behavioral:       No depression or anxiety        Objective:   Physical Exam General -- alert, well-developed, and well-nourished.   Neck --no thyromegaly , normal carotid pulse Lungs -- normal respiratory effort, no intercostal retractions, no accessory muscle use, and normal breath sounds.   Heart-- normal rate, regular rhythm, no murmur, and no gallop.   Abdomen--soft, non-tender, no distention, no masses, no HSM, no guarding, and no rigidity.   Extremities-- no pretibial edema bilaterally Neurologic-- alert & oriented X3 and strength normal in all extremities. Psych-- Cognition and judgment appear intact. Alert and cooperative with normal attention span and concentration.  not anxious appearing and not depressed appearing.        Assessment & Plan:

## 2012-01-21 NOTE — Assessment & Plan Note (Addendum)
Did some physical therapy and stretching and it make him feel better. Also reports that ibuprofen helps, prescription refill, GI side effects discussed Plan: Continue with stretching, physical therapy at home and refill medications as needed

## 2012-01-24 ENCOUNTER — Encounter: Payer: Self-pay | Admitting: Internal Medicine

## 2012-01-25 ENCOUNTER — Encounter: Payer: Self-pay | Admitting: *Deleted

## 2012-05-17 ENCOUNTER — Telehealth: Payer: Self-pay | Admitting: *Deleted

## 2012-05-17 MED ORDER — HYDROCODONE-ACETAMINOPHEN 10-650 MG PO TABS: ORAL_TABLET | ORAL | Status: DC

## 2012-05-17 NOTE — Telephone Encounter (Signed)
.  rx faxed to pharmacy, manually.  

## 2012-05-17 NOTE — Telephone Encounter (Signed)
Ok for #30, no refills 

## 2012-05-17 NOTE — Telephone Encounter (Signed)
Pt is request a refill on lorcet, 01-21-12 last filled #90 3, last OV , pending OV 07-24-12

## 2012-06-06 ENCOUNTER — Telehealth: Payer: Self-pay | Admitting: Internal Medicine

## 2012-06-06 MED ORDER — HYDROCODONE-ACETAMINOPHEN 10-650 MG PO TABS: ORAL_TABLET | ORAL | Status: DC

## 2012-06-06 NOTE — Telephone Encounter (Signed)
Ok to refill 

## 2012-06-06 NOTE — Telephone Encounter (Signed)
Done

## 2012-06-06 NOTE — Telephone Encounter (Signed)
Refill: Hydrocodone-acetaminophen 10-650. Take 1 tablet by mouth every 4 hours if needed for pain. (no more than 3 tablets a day). Last fill 05-25-12

## 2012-07-24 ENCOUNTER — Ambulatory Visit (INDEPENDENT_AMBULATORY_CARE_PROVIDER_SITE_OTHER): Payer: BC Managed Care – PPO | Admitting: Internal Medicine

## 2012-07-24 ENCOUNTER — Encounter: Payer: Self-pay | Admitting: Internal Medicine

## 2012-07-24 VITALS — BP 120/84 | HR 62 | Temp 98.0°F | Wt 249.0 lb

## 2012-07-24 DIAGNOSIS — M545 Low back pain: Secondary | ICD-10-CM

## 2012-07-24 MED ORDER — IBUPROFEN 800 MG PO TABS: 400.0000 mg | ORAL_TABLET | Freq: Three times a day (TID) | ORAL | Status: DC | PRN

## 2012-07-24 NOTE — Assessment & Plan Note (Addendum)
Chronic back pain seems to be okay however he has developed othrer pains: Left iliac crest, shoulder, knee. he is taking a significant amount of  ibuprofen approximately 800 mg  3 times a day some days and hydrocodone twice a day Plan: Refer to ortho. He may benefit from further evaluation.

## 2012-07-24 NOTE — Progress Notes (Signed)
  Subjective:    Patient ID: Dylan Hatfield, male    DOB: 1965-11-11, 46 y.o.   MRN: [id number]  HPI Routine visit Chronic back pain seems to be doing okay at the present time. He has been very active at the gym, diet has improved, has lost weight. He did report that pain at the left iliac crest , right knee and left shoulder for the last few months. The most significant one is in the left iliac crest. Pain is worse with certain positions. He takes hydrocodone on average twice a day and Advil sometimes 3 times a day.   Past medical history   Chronic back pain   DJD-- knee   Sleep apnea, on CPAP, dx ~ 2008   Past surgical history   vasectomy   Social history   Married, children x 2   Tobacco--quit 2003, used to smoke 1 ppd    ETOH--no   Occupation-- Radio broadcast assistant     Family history   Diabetes-- no CAD-- no Stroke--  GM at age 72 Colon cancer-- no Breast cancer-- GM dx at age 71 Prostate cancer--no    Review of Systems Denies nausea, vomiting, diarrhea or change in the color of the stools.     Objective:   Physical Exam  General -- alert, well-developed. No apparent distress.  Extremities-- no pretibial edema bilaterally ; slightly tender to palpation just under the left iliac crest. Not tender to palpation at the trochanteric bursa. Rotation of the hip cause mild discomfort. Neurologic-- alert & oriented X3; DTRs and strength normal in all extremities. Gait normal, no antalgic posture Psych-- Cognition and judgment appear intact. Alert and cooperative with normal attention span and concentration.  not anxious appearing and not depressed appearing.      Assessment & Plan:   pros and cons of flu shot  discussed, not interested at this point

## 2012-08-21 ENCOUNTER — Telehealth: Payer: Self-pay | Admitting: Internal Medicine

## 2012-08-21 MED ORDER — HYDROCODONE-ACETAMINOPHEN 10-650 MG PO TABS: ORAL_TABLET | ORAL | Status: DC

## 2012-08-21 NOTE — Telephone Encounter (Signed)
Ok to refill 

## 2012-08-21 NOTE — Addendum Note (Signed)
Addended by: Willow Ora E on: 08/21/2012 12:45 PM   Modules accepted: Orders

## 2012-08-21 NOTE — Telephone Encounter (Signed)
refill Hydrocodone-Acetaminophen (Tab) 10-650 MG 1 q4h prn pain, not over  3 per day, fill on schedule --last fill 10.19.13 last ov 10.28.13

## 2012-08-21 NOTE — Telephone Encounter (Signed)
Advise patient, I refill #60, he was sent to orthopedic surgery, needs to be evaluated for pain. No further refills

## 2012-09-22 ENCOUNTER — Telehealth: Payer: Self-pay | Admitting: Internal Medicine

## 2012-09-22 MED ORDER — HYDROCODONE-ACETAMINOPHEN 10-650 MG PO TABS: ORAL_TABLET | ORAL | Status: DC

## 2012-09-22 NOTE — Telephone Encounter (Signed)
Ok to refill? Last OV 10.28.13.

## 2012-09-22 NOTE — Telephone Encounter (Signed)
Refill: Hydrocodone-acetaminophen 10-650 mg. Take 1 tablet by mouth every 4 hours if needed for pain (no more than 3 a day). Last fill 08-21-12

## 2012-09-22 NOTE — Telephone Encounter (Signed)
Spoke with pt, he states that he recently saw a doctor @ [facility]. I called [facility] & they are faxing over the most recent OV notes.

## 2012-09-22 NOTE — Telephone Encounter (Signed)
Saw Dr. Turner Daniels 08/05/2012, diagnosed with left knee chondromalacia, left hip strain. Was prescribed physical therapy,meloxicam and recommend to return to the office in one month. Plan: Refill vicodin 90. Needs to sign a  controlled substance agreement next week (today is Friday 4 pm) Needs to take meloxicam as prescribed by orthopedic surgery and keep his appointments with them

## 2012-09-22 NOTE — Telephone Encounter (Signed)
See previous note, no further refills until he is fully eval by orthopedic surgery and I have a copy of the report.

## 2012-09-22 NOTE — Telephone Encounter (Signed)
Discussed with pt

## 2013-01-22 ENCOUNTER — Encounter: Payer: Self-pay | Admitting: Internal Medicine

## 2013-01-22 ENCOUNTER — Ambulatory Visit (INDEPENDENT_AMBULATORY_CARE_PROVIDER_SITE_OTHER): Payer: BC Managed Care – PPO | Admitting: Internal Medicine

## 2013-01-22 VITALS — BP 130/84 | HR 64 | Temp 97.8°F | Ht 76.0 in | Wt 263.0 lb

## 2013-01-22 DIAGNOSIS — Z0000 Encounter for general adult medical examination without abnormal findings: Secondary | ICD-10-CM

## 2013-01-22 DIAGNOSIS — M545 Low back pain: Secondary | ICD-10-CM

## 2013-01-22 LAB — COMPREHENSIVE METABOLIC PANEL
Albumin: 4.6 g/dL (ref 3.5–5.2)
Alkaline Phosphatase: 41 U/L (ref 39–117)
BUN: 17 mg/dL (ref 6–23)
Glucose, Bld: 96 mg/dL (ref 70–99)
Total Bilirubin: 0.9 mg/dL (ref 0.3–1.2)

## 2013-01-22 LAB — LIPID PANEL
Cholesterol: 187 mg/dL (ref 0–200)
LDL Cholesterol: 123 mg/dL — ABNORMAL HIGH (ref 0–99)
Triglycerides: 156 mg/dL — ABNORMAL HIGH (ref 0.0–149.0)
VLDL: 31.2 mg/dL (ref 0.0–40.0)

## 2013-01-22 NOTE — Progress Notes (Signed)
  Subjective:    Patient ID: Dylan Hatfield, male    DOB: 24-Apr-1966, 47 y.o.   MRN: [id number]  HPI CPX We also discussed back pain  Past medical history   Chronic back pain   DJD-- knee   Sleep apnea, on CPAP, dx ~ 2008   Past surgical history   vasectomy   Social history   Married, children x 2   Tobacco--quit 2003, used to smoke 1 ppd    ETOH--no   Occupation-- Radio broadcast assistant   Was able to loose weight by eating healthier, has regained some in the last few weeks, remains very active at work   Family history   Diabetes-- no CAD-- no Stroke--  GM at age 44 Colon cancer-- no Breast cancer-- GM dx at age 34 Prostate cancer--no   Review of Systems  Respiratory: Negative for cough and shortness of breath.   Cardiovascular: Negative for chest pain and leg swelling.  Gastrointestinal: Negative for abdominal pain and blood in stool.  Genitourinary: Negative for dysuria and hematuria.  Psychiatric/Behavioral:       No depression or anxiety but a lot of stress at work on-off         Objective:   Physical Exam BP 130/84  Pulse 64  Temp(Src) 97.8 F (36.6 C) (Oral)  Ht 6\' 4"  (1.93 m)  Wt 263 lb (119.296 kg)  BMI 32.03 kg/m2  SpO2 97%  General -- alert, well-developed .   Neck --no thyromegaly  Lungs -- normal respiratory effort, no intercostal retractions, no accessory muscle use, and normal breath sounds.   Heart-- normal rate, regular rhythm, no murmur, and no gallop.   Abdomen--soft, non-tender, no distention, no masses, no HSM, no guarding, and no rigidity.   Extremities-- no pretibial edema bilaterally Neurologic-- alert & oriented X3 and strength normal in all extremities. Psych-- Cognition and judgment appear intact. Alert and cooperative with normal attention span and concentration.  not anxious appearing and not depressed appearing.      Assessment & Plan:

## 2013-01-22 NOTE — Assessment & Plan Note (Addendum)
Td 2010 Never had a cscope Labs Diet-exercise discussed , he was able to loose sveral pounds last year, encouraged to keep trying

## 2013-01-22 NOTE — Assessment & Plan Note (Addendum)
Ran out of pain meds and saw no major difference in the pain which still there but not causing him to stop working Plan: Motrin PRN Refer to pain managment

## 2013-01-24 ENCOUNTER — Encounter: Payer: Self-pay | Admitting: *Deleted

## 2013-02-14 ENCOUNTER — Other Ambulatory Visit: Payer: Self-pay | Admitting: Pain Medicine

## 2013-02-14 DIAGNOSIS — M545 Low back pain: Secondary | ICD-10-CM

## 2013-02-21 ENCOUNTER — Other Ambulatory Visit: Payer: Self-pay | Admitting: Pain Medicine

## 2013-02-21 DIAGNOSIS — Z139 Encounter for screening, unspecified: Secondary | ICD-10-CM

## 2013-02-23 ENCOUNTER — Ambulatory Visit
Admission: RE | Admit: 2013-02-23 | Discharge: 2013-02-23 | Disposition: A | Discharge status: Home / Self Care | Payer: BC Managed Care – PPO | Source: Ambulatory Visit | Attending: Pain Medicine | Admitting: Pain Medicine

## 2013-02-23 DIAGNOSIS — Z139 Encounter for screening, unspecified: Secondary | ICD-10-CM

## 2013-02-24 ENCOUNTER — Ambulatory Visit
Admission: RE | Admit: 2013-02-24 | Discharge: 2013-02-24 | Disposition: A | Discharge status: Home / Self Care | Payer: BC Managed Care – PPO | Source: Ambulatory Visit | Attending: Pain Medicine | Admitting: Pain Medicine

## 2013-02-24 DIAGNOSIS — M545 Low back pain, unspecified: Secondary | ICD-10-CM

## 2014-01-23 ENCOUNTER — Telehealth: Payer: Self-pay

## 2014-01-23 NOTE — Telephone Encounter (Signed)
Medication List and allergies:  Reviewed and updated  90 day supply/mail order: na Local prescriptions: [facility] on [address]  Immunizations due: flu vaccine in the fall  A/P:   No changes to FH, PSH or Personal Hx Flu vaccine-- Tdap--2010 CCS--never had PSA--none noted  To Discuss with Provider: Not a this time

## 2014-01-23 NOTE — Telephone Encounter (Signed)
Left message for call back Non identifiable  Tdap--2010 Never has cscope

## 2014-01-24 ENCOUNTER — Ambulatory Visit (INDEPENDENT_AMBULATORY_CARE_PROVIDER_SITE_OTHER): Payer: BC Managed Care – PPO | Admitting: Internal Medicine

## 2014-01-24 ENCOUNTER — Encounter: Payer: Self-pay | Admitting: Internal Medicine

## 2014-01-24 VITALS — BP 124/79 | HR 68 | Temp 98.0°F | Ht 76.2 in | Wt 272.0 lb

## 2014-01-24 DIAGNOSIS — R399 Unspecified symptoms and signs involving the genitourinary system: Secondary | ICD-10-CM

## 2014-01-24 DIAGNOSIS — M545 Low back pain, unspecified: Secondary | ICD-10-CM

## 2014-01-24 DIAGNOSIS — Z0000 Encounter for general adult medical examination without abnormal findings: Secondary | ICD-10-CM

## 2014-01-24 DIAGNOSIS — M954 Acquired deformity of chest and rib: Secondary | ICD-10-CM

## 2014-01-24 DIAGNOSIS — R3989 Other symptoms and signs involving the genitourinary system: Secondary | ICD-10-CM

## 2014-01-24 LAB — COMPREHENSIVE METABOLIC PANEL
ALBUMIN: 4.8 g/dL (ref 3.5–5.2)
ALK PHOS: 42 U/L (ref 39–117)
ALT: 26 U/L (ref 0–53)
AST: 20 U/L (ref 0–37)
BILIRUBIN TOTAL: 1.3 mg/dL — AB (ref 0.3–1.2)
BUN: 17 mg/dL (ref 6–23)
CO2: 28 meq/L (ref 19–32)
Calcium: 9.6 mg/dL (ref 8.4–10.5)
Chloride: 101 mEq/L (ref 96–112)
Creatinine, Ser: 0.9 mg/dL (ref 0.4–1.5)
GFR: 94.27 mL/min (ref >60.00)
GLUCOSE: 99 mg/dL (ref 70–99)
POTASSIUM: 4.4 meq/L (ref 3.5–5.1)
SODIUM: 137 meq/L (ref 135–145)
TOTAL PROTEIN: 6.8 g/dL (ref 6.0–8.3)

## 2014-01-24 LAB — CBC WITH DIFFERENTIAL/PLATELET
BASOS ABS: 0 10*3/uL (ref 0.0–0.1)
Basophils Relative: 0.4 % (ref 0.0–3.0)
Eosinophils Absolute: 0.2 10*3/uL (ref 0.0–0.7)
Eosinophils Relative: 2.3 % (ref 0.0–5.0)
HEMATOCRIT: 46.4 % (ref 39.0–52.0)
Hemoglobin: 15.7 g/dL (ref 13.0–17.0)
LYMPHS ABS: 2.1 10*3/uL (ref 0.7–4.0)
Lymphocytes Relative: 27.9 % (ref 12.0–46.0)
MCHC: 33.8 g/dL (ref 30.0–36.0)
MCV: 86.8 fl (ref 78.0–100.0)
MONO ABS: 0.4 10*3/uL (ref 0.1–1.0)
Monocytes Relative: 6.1 % (ref 3.0–12.0)
NEUTROS PCT: 63.3 % (ref 43.0–77.0)
Neutro Abs: 4.7 10*3/uL (ref 1.4–7.7)
PLATELETS: 236 10*3/uL (ref 150.0–400.0)
RBC: 5.35 Mil/uL (ref 4.22–5.81)
RDW: 14.2 % (ref 11.5–14.6)
WBC: 7.4 10*3/uL (ref 4.5–10.5)

## 2014-01-24 LAB — URINALYSIS, ROUTINE W REFLEX MICROSCOPIC
Bilirubin Urine: NEGATIVE
Hgb urine dipstick: NEGATIVE
Ketones, ur: NEGATIVE
LEUKOCYTES UA: NEGATIVE
NITRITE: NEGATIVE
PH: 7 (ref 5.0–8.0)
RBC / HPF: NONE SEEN (ref 0–?)
SPECIFIC GRAVITY, URINE: 1.015 (ref 1.000–1.030)
Total Protein, Urine: NEGATIVE
Urine Glucose: NEGATIVE
Urobilinogen, UA: 0.2 (ref 0.0–1.0)
WBC UA: NONE SEEN (ref 0–?)

## 2014-01-24 LAB — LIPID PANEL
CHOLESTEROL: 221 mg/dL — AB (ref 0–200)
HDL: 34.5 mg/dL — ABNORMAL LOW (ref >39.00)
LDL Cholesterol: 155 mg/dL — ABNORMAL HIGH (ref 0–99)
Total CHOL/HDL Ratio: 6
Triglycerides: 160 mg/dL — ABNORMAL HIGH (ref 0.0–149.0)
VLDL: 32 mg/dL (ref 0.0–40.0)

## 2014-01-24 LAB — PSA: PSA: 0.69 ng/mL (ref 0.10–4.00)

## 2014-01-24 LAB — TSH: TSH: 2.23 u[IU]/mL (ref 0.35–5.50)

## 2014-01-24 MED ORDER — TOLTERODINE TARTRATE 2 MG PO TABS: 2.0000 mg | ORAL_TABLET | Freq: Two times a day (BID) | ORAL | Status: DC

## 2014-01-24 NOTE — Assessment & Plan Note (Addendum)
Td 2010 Never had a cscope Labs Diet-exercise discussed   Sebaceous cyst, right under arm, recommend observation, watch for signs of infection

## 2014-01-24 NOTE — Patient Instructions (Signed)
Get your blood work before you leave   Get the XR at [facility] and [address] (10 minutes form here); they are open 24/7 [place], [place] [id number] [phone]  Trial with detrol at night Drink plenty of fluids   Next visit is for routine check up in 6 months

## 2014-01-24 NOTE — Assessment & Plan Note (Signed)
Heart structure the left the sternum, will get a  x-ray

## 2014-01-24 NOTE — Progress Notes (Signed)
Subjective:    Patient ID: Dylan Hatfield, male    DOB: Feb 09, 1966, 47 y.o.   MRN: [id number]  DOS:  01/24/2014 Type of  visit: CPX Has some other issues, see physical exam and assessment and plan   ROS Diet-- trying to do well  Exercise-- active at work, stretching for his back, no routine exercise  No  CP, SOB No palpitations  No orthopnea , DOE Denies  nausea, vomiting diarrhea No abdominal pain Denies  blood in the stools No testicular pain  No anxiety, depression    Past Medical History  Diagnosis Date  . Chronic back pain   . Arthritis   . Sleep apnea     on CPAP, dx ~ 2008     Past Surgical History  Procedure Laterality Date  . Vasectomy      History   Social History  . Marital Status: Married    Spouse Name: N/A    Number of Children: 2  . Years of Education: N/A   Occupational History  . Occupation-- Chief Financial Officer      Social History Main Topics  . Smoking status: Former Research scientist (life sciences)  . Smokeless tobacco: Former Systems developer     Comment: 1 ppd, quit 2003  . Alcohol Use: No  . Drug Use: No  . Sexual Activity: Yes   Other Topics Concern  . Not on file   Social History Narrative  . No narrative on file     Family History  Problem Relation Age of Onset  . Diabetes Neg Hx   . CAD Neg Hx   . Colon cancer Neg Hx   . Prostate cancer Neg Hx   . Stroke Other     GM [address] 88       Medication List       This list is accurate as of: 01/24/14  1:07 PM.  Always use your most recent med list.               ADVIL 200 MG tablet  Generic drug:  ibuprofen  Take 200 mg by mouth every 6 (six) hours as needed.     HYDROcodone-acetaminophen 10-325 MG per tablet  Commonly known as:  NORCO  Take 1 tablet by mouth as needed.     tolterodine 2 MG tablet  Commonly known as:  DETROL  Take 1 tablet (2 mg total) by mouth 2 (two) times daily.           Objective:   Physical Exam  Musculoskeletal:       Arms:  BP 124/79  Pulse 68  Temp(Src)  98 F (36.7 C)  Ht 6' 4.2" (1.935 m)  Wt 272 lb (123.378 kg)  BMI 32.95 kg/m2  SpO2 97% General -- alert, well-developed, NAD.  Neck --no thyromegaly , normal carotid pulse, no LAD HEENT-- Not pale.   Lungs -- normal respiratory effort, no intercostal retractions, no accessory muscle use, and normal breath sounds.  Heart-- normal rate, regular rhythm, no murmur.  Abdomen-- Not distended, good bowel sounds,soft, non-tender. Rectal-- No external abnormalities noted. Normal sphincter tone. No rectal masses or tenderness. Brown stool, Hemoccult negative  Prostate--Prostate gland firm and smooth, no enlargement, nodularity, tenderness, mass, asymmetry or induration. Extremities-- no pretibial edema bilaterally ; R armpit has a soft, slt tender, not warm,1.5 cm mass c/w a cyst Neurologic--  alert & oriented X3. Speech normal, gait normal, strength normal in all extremities.  Psych-- Cognition and judgment appear intact. Cooperative with normal  attention span and concentration. No anxious or depressed appearing.      Assessment & Plan:

## 2014-01-24 NOTE — Assessment & Plan Note (Signed)
Now under pain mgmt

## 2014-01-24 NOTE — Progress Notes (Signed)
Pre visit review using our clinic review tool, if applicable. No additional management support is needed unless otherwise documented below in the visit note. 

## 2014-01-24 NOTE — Assessment & Plan Note (Addendum)
Many years history of nocturia, 3 or 4 times a night. Stream is sometimes weak, mostly at night. No dysuria, gross hematuria, difficulty urinating or urgency. Previous PCP rx Flomax but caused retrograde ejaculation. DRE normal today Plan: PSA, UA, trial with detrol

## 2014-01-28 ENCOUNTER — Encounter: Payer: Self-pay | Admitting: *Deleted

## 2014-01-31 ENCOUNTER — Ambulatory Visit (INDEPENDENT_AMBULATORY_CARE_PROVIDER_SITE_OTHER): Payer: BC Managed Care – PPO | Admitting: Internal Medicine

## 2014-01-31 ENCOUNTER — Encounter: Payer: Self-pay | Admitting: Internal Medicine

## 2014-01-31 VITALS — BP 113/74 | HR 62 | Temp 98.3°F | Wt 271.8 lb

## 2014-01-31 DIAGNOSIS — L0390 Cellulitis, unspecified: Secondary | ICD-10-CM

## 2014-01-31 DIAGNOSIS — L0291 Cutaneous abscess, unspecified: Secondary | ICD-10-CM

## 2014-01-31 MED ORDER — CEPHALEXIN 500 MG PO CAPS: 500.0000 mg | ORAL_CAPSULE | Freq: Four times a day (QID) | ORAL | Status: DC

## 2014-01-31 NOTE — Progress Notes (Signed)
   Subjective:    Patient ID: Dylan Hatfield, male    DOB: 01/13/1966, 48 y.o.   MRN: [id number]  DOS:  01/31/2014 Type of  visit: In the last few days, cyst @ R armpit is bigger, tender, no d/c    ROS No F/C  Past Medical History  Diagnosis Date  . Chronic back pain   . Arthritis   . Sleep apnea     on CPAP, dx ~ 2008     Past Surgical History  Procedure Laterality Date  . Vasectomy      History   Social History  . Marital Status: Married    Spouse Name: N/A    Number of Children: 2  . Years of Education: N/A   Occupational History  . Occupation-- Chief Financial Officer      Social History Main Topics  . Smoking status: Former Research scientist (life sciences)  . Smokeless tobacco: Former Systems developer     Comment: 1 ppd, quit 2003  . Alcohol Use: No  . Drug Use: No  . Sexual Activity: Yes   Other Topics Concern  . Not on file   Social History Narrative  . No narrative on file        Medication List       This list is accurate as of: 01/31/14  2:10 PM.  Always use your most recent med list.               ADVIL 200 MG tablet  Generic drug:  ibuprofen  Take 200 mg by mouth every 6 (six) hours as needed.     cephALEXin 500 MG capsule  Commonly known as:  KEFLEX  Take 1 capsule (500 mg total) by mouth 4 (four) times daily.     HYDROcodone-acetaminophen 10-325 MG per tablet  Commonly known as:  NORCO  Take 1 tablet by mouth as needed.     tolterodine 2 MG tablet  Commonly known as:  DETROL  Take 1 tablet (2 mg total) by mouth 2 (two) times daily.           Objective:   Physical Exam BP 113/74  Pulse 62  Temp(Src) 98.3 F (36.8 C) (Oral)  Wt 271 lb 12.8 oz (123.288 kg)  SpO2 98%  General -- alert, well-developed, NAD.  Soft, fluctuant 3x2 cm mass @ R arm pit  Psych-- Cognition and judgment appear intact. Cooperative with normal attention span and concentration. No anxious or depressed appearing.       Assessment & Plan:    Abscess:  In a  sterile fashion and  local anesthesia with lidocaine 1% without --approximately 1 cc-- we did a 1 cm incision, fatty tissue,  some discharge and pieces of a cyst wall were removed. Bleeding stopped w/  pressure. Culture sent. Keflex  See instructions

## 2014-01-31 NOTE — Progress Notes (Signed)
Pre visit review using our clinic review tool, if applicable. No additional management support is needed unless otherwise documented below in the visit note. 

## 2014-01-31 NOTE — Patient Instructions (Signed)
Take the antibiotic keflex  4 times a day for one week If you have some bleeding, apply pressure it should   stop within 10 minutes If the bleeding is severe or more abundant you need to let us know If the area gets swollen, red, is warm to touch  or you have fever you also need to let us know.

## 2014-02-03 LAB — WOUND CULTURE: GRAM STAIN: NONE SEEN

## 2014-07-26 ENCOUNTER — Encounter: Payer: Self-pay | Admitting: Internal Medicine

## 2014-07-26 ENCOUNTER — Ambulatory Visit (INDEPENDENT_AMBULATORY_CARE_PROVIDER_SITE_OTHER): Payer: 59 | Admitting: Internal Medicine

## 2014-07-26 ENCOUNTER — Ambulatory Visit (HOSPITAL_BASED_OUTPATIENT_CLINIC_OR_DEPARTMENT_OTHER)
Admission: RE | Admit: 2014-07-26 | Discharge: 2014-07-26 | Disposition: A | Discharge status: Home / Self Care | Payer: 59 | Source: Ambulatory Visit | Attending: Radiology | Admitting: Radiology

## 2014-07-26 VITALS — BP 155/97 | HR 70 | Temp 97.7°F | Wt 280.5 lb

## 2014-07-26 DIAGNOSIS — E785 Hyperlipidemia, unspecified: Secondary | ICD-10-CM

## 2014-07-26 DIAGNOSIS — M954 Acquired deformity of chest and rib: Secondary | ICD-10-CM

## 2014-07-26 DIAGNOSIS — R399 Unspecified symptoms and signs involving the genitourinary system: Secondary | ICD-10-CM

## 2014-07-26 DIAGNOSIS — Z23 Encounter for immunization: Secondary | ICD-10-CM

## 2014-07-26 LAB — LIPID PANEL
CHOL/HDL RATIO: 7
CHOLESTEROL: 224 mg/dL — AB (ref 0–200)
HDL: 31.2 mg/dL — AB (ref >39.00)
NonHDL: 192.8
TRIGLYCERIDES: 237 mg/dL — AB (ref 0.0–149.0)
VLDL: 47.4 mg/dL — ABNORMAL HIGH (ref 0.0–40.0)

## 2014-07-26 LAB — LDL CHOLESTEROL, DIRECT: LDL DIRECT: 139 mg/dL

## 2014-07-26 NOTE — Progress Notes (Signed)
Pre visit review using our clinic review tool, if applicable. No additional management support is needed unless otherwise documented below in the visit note. 

## 2014-07-26 NOTE — Assessment & Plan Note (Signed)
Unchanged from previous exam, we'll get an x-ray

## 2014-07-26 NOTE — Assessment & Plan Note (Signed)
detrol helped but caused side effects. Currently sx are  "ok"

## 2014-07-26 NOTE — Patient Instructions (Signed)
Get your blood work before you leave   Stop by the first floor and get the XR    Please come back to the office by 12-2014 for a physical exam. Come back fasting

## 2014-07-26 NOTE — Progress Notes (Signed)
   Subjective:    Patient ID: Dylan Hatfield, male    DOB: December 31, 1965, 48 y.o.   MRN: [id number]  DOS:  07/26/2014 Type of visit - description : rov Interval history: Mild dyslipidemia, diet has improved a little, he remains very active at work LUTS, tried [employer], it did help but again  Cause some side effects-- retrograde ejaculation  ROS Denies chest pain or difficulty breathing No gross hematuria. BP today slightly elevated, not ambulatory BPs   Past Medical History  Diagnosis Date  . Chronic back pain   . Arthritis   . Sleep apnea     on CPAP, dx ~ 2008     Past Surgical History  Procedure Laterality Date  . Vasectomy      History   Social History  . Marital Status: Married    Spouse Name: N/A    Number of Children: 2  . Years of Education: N/A   Occupational History  . Occupation-- Chief Financial Officer      Social History Main Topics  . Smoking status: Former Research scientist (life sciences)  . Smokeless tobacco: Former Systems developer     Comment: 1 ppd, quit 2003  . Alcohol Use: No  . Drug Use: No  . Sexual Activity: Yes   Other Topics Concern  . Not on file   Social History Narrative  . No narrative on file        Medication List       This list is accurate as of: 07/26/14  7:19 PM.  Always use your most recent med list.               ADVIL 200 MG tablet  Generic drug:  ibuprofen  Take 200 mg by mouth every 6 (six) hours as needed.     HYDROcodone-acetaminophen 10-325 MG per tablet  Commonly known as:  NORCO  Take 1 tablet by mouth as needed.           Objective:   Physical Exam BP 155/97  Pulse 70  Temp(Src) 97.7 F (36.5 C) (Oral)  Wt 280 lb 8 oz (127.234 kg)  SpO2 95% General -- alert, well-developed, NAD.   Lungs -- normal respiratory effort, no intercostal retractions, no accessory muscle use, and normal breath sounds.  Chest wall unchanged, see previous note Heart-- normal rate, regular rhythm, no murmur.  Extremities-- no pretibial edema bilaterally   Neurologic--  alert & oriented X3. Speech normal, gait appropriate for age, strength symmetric and appropriate for age.  Psych-- Cognition and judgment appear intact. Cooperative with normal attention span and concentration. No anxious or depressed appearing.        Assessment & Plan:    Slightly elevated BP today, we'll recheck on return to the office, no history of hypertension  Mild dyslipidemia, labs

## 2014-12-23 ENCOUNTER — Telehealth: Payer: Self-pay | Admitting: Internal Medicine

## 2014-12-23 NOTE — Telephone Encounter (Signed)
Pre vist letter sent

## 2015-01-01 ENCOUNTER — Other Ambulatory Visit: Payer: Self-pay

## 2015-01-08 ENCOUNTER — Encounter: Payer: 59 | Admitting: Internal Medicine

## 2015-04-22 ENCOUNTER — Encounter: Payer: Self-pay | Admitting: Internal Medicine

## 2015-06-16 ENCOUNTER — Telehealth: Payer: Self-pay | Admitting: Behavioral Health

## 2015-06-16 ENCOUNTER — Encounter: Payer: Self-pay | Admitting: Behavioral Health

## 2015-06-16 NOTE — Telephone Encounter (Signed)
Pre-Visit Call completed with patient and chart updated.   Pre-Visit Info documented in Specialty Comments under [employer].    

## 2015-06-17 ENCOUNTER — Ambulatory Visit (INDEPENDENT_AMBULATORY_CARE_PROVIDER_SITE_OTHER): Payer: 59 | Admitting: Internal Medicine

## 2015-06-17 ENCOUNTER — Encounter: Payer: Self-pay | Admitting: Internal Medicine

## 2015-06-17 VITALS — BP 126/70 | HR 62 | Temp 98.0°F | Ht 76.0 in | Wt 269.1 lb

## 2015-06-17 DIAGNOSIS — Z0000 Encounter for general adult medical examination without abnormal findings: Secondary | ICD-10-CM

## 2015-06-17 DIAGNOSIS — R002 Palpitations: Secondary | ICD-10-CM

## 2015-06-17 DIAGNOSIS — Z23 Encounter for immunization: Secondary | ICD-10-CM | POA: Diagnosis not present

## 2015-06-17 DIAGNOSIS — Z09 Encounter for follow-up examination after completed treatment for conditions other than malignant neoplasm: Secondary | ICD-10-CM

## 2015-06-17 DIAGNOSIS — Z114 Encounter for screening for human immunodeficiency virus [HIV]: Secondary | ICD-10-CM

## 2015-06-17 NOTE — Progress Notes (Signed)
Subjective:    Patient ID: Dylan Hatfield, male    DOB: [birth year], 49 y.o.   MRN: [id number]  DOS:  06/17/2015 Type of visit - description : CPX Interval history: Complain of palpitations, see below   Review of Systems  Constitutional: No fever. No chills. No unexplained wt changes. No unusual sweats  HEENT: No dental problems, no ear discharge, no facial swelling, no voice changes. No eye discharge, no eye  redness , no  intolerance to light   Respiratory: No wheezing , no  difficulty breathing. No cough , no mucus production  Cardiovascular:  Last year for a period of 3 months he experienced palpitations described as "intense heartbeats". No skipping, not "going fast", symptoms were random, lasted one or 2 hours, sometimes wake up by symptoms at night. Admits that at the time she had more stress. No associated symptoms. No CP, no leg swelling   GI: no nausea, no vomiting, no diarrhea , no  abdominal pain.  No blood in the stools. No dysphagia, no odynophagia    Endocrine: No polyphagia, no polyuria , no polydipsia  GU: No dysuria, gross hematuria, difficulty urinating. No urinary urgency, no frequency.  Musculoskeletal: No joint swellings or unusual aches or pains  Skin: No change in the color of the skin, palor , no  Rash  Allergic, immunologic: No environmental allergies , no  food allergies  Neurological: No dizziness no  syncope. No headaches. No diplopia, no slurred, no slurred speech, no motor deficits, no facial  Numbness  Hematological: No enlarged lymph nodes, no easy bruising , no unusual bleedings  Psychiatry: No suicidal ideas, no hallucinations, no beavior problems, no confusion.  No unusual/severe anxiety, no depression   Past Medical History  Diagnosis Date  . Chronic back pain   . Arthritis   . Sleep apnea     on CPAP, dx ~ 2008     Past Surgical History  Procedure Laterality Date  . Vasectomy      Social History   Social History  .  Marital Status: Married    Spouse Name: N/A  . Number of Children: 2  . Years of Education: N/A   Occupational History  . Occupation-- Chief Financial Officer      Social History Main Topics  . Smoking status: Former Research scientist (life sciences)  . Smokeless tobacco: Former Systems developer     Comment: 1 ppd, quit 2003  . Alcohol Use: No  . Drug Use: No  . Sexual Activity: Yes   Other Topics Concern  . Not on file   Social History Narrative     Family History  Problem Relation Age of Onset  . Diabetes Neg Hx   . CAD Neg Hx   . Colon cancer Neg Hx   . Prostate cancer Neg Hx   . Stroke Other     GM stroke 38  . Breast cancer Other     GM       Medication List       This list is accurate as of: 06/17/15  6:19 PM.  Always use your most recent med list.               ADVIL 200 MG tablet  Generic drug:  ibuprofen  Take 200 mg by mouth every 6 (six) hours as needed.     baclofen 10 MG tablet  Commonly known as:  LIORESAL  Take 10 mg by mouth as needed for muscle spasms.     HYDROcodone-acetaminophen 10-325  MG per tablet  Commonly known as:  NORCO  Take 1 tablet by mouth as needed.           Objective:   Physical Exam  Skin:      BP 126/70 mmHg  Pulse 62  Temp(Src) 98 F (36.7 C) (Oral)  Ht 6\' 4"  (1.93 m)  Wt 269 lb 2 oz (122.074 kg)  BMI 32.77 kg/m2  SpO2 97% General:   Well developed, well nourished . NAD.  HEENT:  Normocephalic . Face symmetric, atraumatic Neck: No thyromegaly Lungs:  CTA B Normal respiratory effort, no intercostal retractions, no accessory muscle use. Heart: RRR,  no murmur.  no pretibial edema bilaterally  Abdomen:  Not distended, soft, non-tender. No rebound or rigidity. No mass,organomegaly Skin: Not pale. Not jaundice Neurologic:  alert & oriented X3.  Speech normal, gait appropriate for age and unassisted Psych--  Cognition and judgment appear intact.  Cooperative with normal attention span and concentration.  Behavior appropriate. No  anxious or depressed appearing.    Assessment & Plan:   Problem list > OSA   Dx 2008, on CPAP Chronic back pain-- sees pain mngmt  LUTS (nocturia x years, flomax caused retrograde ejaculation) Bony mass deformity of the chest wall, XR 06-2014 -- benign changes Lipoma: At the mid back, diameter 10 cm  A/P Palpitations: Last year had palpitations for a period of 3 months, today's EKG at baseline. We will get the echocardiogram, patient to call if symptoms resurface

## 2015-06-17 NOTE — Assessment & Plan Note (Addendum)
Td 2010, flu shot today. CCS: Never had a cscope Prostate cancer screening: Not indicated Labs: CMP, FLP, TSH. Diet-exercise discussed

## 2015-06-17 NOTE — Assessment & Plan Note (Signed)
Palpitations: Last year had palpitations for a period of 3 months, today's EKG at baseline. We will get the echocardiogram, patient to call if symptoms resurface

## 2015-06-17 NOTE — Progress Notes (Signed)
Pre visit review using our clinic review tool, if applicable. No additional management support is needed unless otherwise documented below in the visit note. 

## 2015-06-17 NOTE — Patient Instructions (Signed)
Get your blood work before you leave     Next visit  for a  physical exam in one year, fasting  Please schedule an appointment at the front desk

## 2015-06-18 LAB — LIPID PANEL
CHOL/HDL RATIO: 6
Cholesterol: 238 mg/dL — ABNORMAL HIGH (ref 0–200)
HDL: 38.2 mg/dL — AB (ref >39.00)
LDL Cholesterol: 164 mg/dL — ABNORMAL HIGH (ref 0–99)
NONHDL: 199.79
Triglycerides: 180 mg/dL — ABNORMAL HIGH (ref 0.0–149.0)
VLDL: 36 mg/dL (ref 0.0–40.0)

## 2015-06-18 LAB — COMPREHENSIVE METABOLIC PANEL
ALT: 24 U/L (ref 0–53)
AST: 15 U/L (ref 0–37)
Albumin: 4.9 g/dL (ref 3.5–5.2)
Alkaline Phosphatase: 43 U/L (ref 39–117)
BUN: 22 mg/dL (ref 6–23)
CO2: 32 meq/L (ref 19–32)
CREATININE: 1.03 mg/dL (ref 0.40–1.50)
Calcium: 9.9 mg/dL (ref 8.4–10.5)
Chloride: 101 mEq/L (ref 96–112)
GFR: 81.25 mL/min (ref >60.00)
GLUCOSE: 95 mg/dL (ref 70–99)
Potassium: 4.3 mEq/L (ref 3.5–5.1)
Sodium: 139 mEq/L (ref 135–145)
Total Bilirubin: 1.1 mg/dL (ref 0.2–1.2)
Total Protein: 7.3 g/dL (ref 6.0–8.3)

## 2015-06-18 LAB — TSH: TSH: 2.27 u[IU]/mL (ref 0.35–4.50)

## 2015-06-18 LAB — HIV ANTIBODY (ROUTINE TESTING W REFLEX): HIV 1&2 Ab, 4th Generation: NONREACTIVE

## 2016-09-28 DIAGNOSIS — G894 Chronic pain syndrome: Secondary | ICD-10-CM | POA: Diagnosis not present

## 2016-09-28 DIAGNOSIS — M47817 Spondylosis without myelopathy or radiculopathy, lumbosacral region: Secondary | ICD-10-CM | POA: Diagnosis not present

## 2016-09-28 DIAGNOSIS — Z79891 Long term (current) use of opiate analgesic: Secondary | ICD-10-CM | POA: Diagnosis not present

## 2016-09-28 DIAGNOSIS — M5137 Other intervertebral disc degeneration, lumbosacral region: Secondary | ICD-10-CM | POA: Diagnosis not present

## 2016-09-28 DIAGNOSIS — Z79899 Other long term (current) drug therapy: Secondary | ICD-10-CM | POA: Diagnosis not present

## 2016-11-23 ENCOUNTER — Telehealth: Payer: Self-pay | Admitting: Internal Medicine

## 2016-11-23 ENCOUNTER — Ambulatory Visit: Payer: Self-pay | Admitting: Family Medicine

## 2016-11-23 DIAGNOSIS — Z79899 Other long term (current) drug therapy: Secondary | ICD-10-CM | POA: Diagnosis not present

## 2016-11-23 DIAGNOSIS — G894 Chronic pain syndrome: Secondary | ICD-10-CM | POA: Diagnosis not present

## 2016-11-23 DIAGNOSIS — Z79891 Long term (current) use of opiate analgesic: Secondary | ICD-10-CM | POA: Diagnosis not present

## 2016-11-23 DIAGNOSIS — M5137 Other intervertebral disc degeneration, lumbosacral region: Secondary | ICD-10-CM | POA: Diagnosis not present

## 2016-11-23 DIAGNOSIS — M47817 Spondylosis without myelopathy or radiculopathy, lumbosacral region: Secondary | ICD-10-CM | POA: Diagnosis not present

## 2016-11-23 NOTE — Telephone Encounter (Signed)
Given patient's symptoms, pt was advised to go to the ER and not wait until 5 pm.   Pt stated that he has been having symptoms--heart pounding and shortness of breath---off and on x 1 year and wants a cardiology referral.  He also says that his blood pressure has been slightly elevated at 130/95.  He agreed to go to the ER as advised.

## 2016-11-23 NOTE — Telephone Encounter (Signed)
Patient called to set up a CPE appointment with Dr. Larose Kells and also talk with him about another issue. After the CPE was scheduled for 12/08/16 I asked him about the other issue he had mentioned. He stated that recently he has been having heart pounding that lasts for a few hours. He states that it feels like anxiety but he doesn't have any history of that. He states that he does not feel like it is life threatening at all and it could probably wait until his CPE appointment on 12/08/16. Patient agreed to speak with a triage nurse to be evaluated. Sent to team health.

## 2016-11-23 NOTE — Telephone Encounter (Signed)
[facility] - Client [facility]  Patient Name: Dylan Hatfield  DOB: 1966/09/03    Initial Comment Caller states having some heart pounding, thinks it might be anxiety, no chest pain   Nurse Assessment  Nurse: Wynetta Emery, RN, Baker Janus Date/Time (Eastern Time): 11/23/2016 9:55:08 AM  Confirm and document reason for call. If symptomatic, describe symptoms. ---[name] has heart pounding thought it was anxiety intermittent - started again yesterday and into evening-- starting back this am  Does the patient have any new or worsening symptoms? ---Yes  Will a triage be completed? ---Yes  Related visit to physician within the last 2 weeks? ---No  Does the PT have any chronic conditions? (i.e. diabetes, asthma, etc.) ---No  Is this a behavioral health or substance abuse call? ---No     Guidelines    Guideline Title Affirmed Question Affirmed Notes       Final Disposition User        Comments  NOTE NO availability with Dr. Larose Kells today episodic bouts of heart pounding shortness of breath lasting longer and longer getting concerned 11-23-2016 1700 with Dr. Carollee Herter

## 2016-11-24 NOTE — Telephone Encounter (Signed)
No ER visit noted.  Called patient to follow up.  Pt stated he decided not to go to the ER.  Stated he wanted to wait until he had another episode before he went (last episode was Monday 11/22/16).   Pt was advised to see PCP regarding symptoms.  Appt scheduled for tomorrow at 8:45 am. He was advised if symptoms return to go to the ER to be evaluated.  He stated understanding and agreed to comply.

## 2016-11-25 ENCOUNTER — Encounter: Payer: Self-pay | Admitting: Internal Medicine

## 2016-11-25 ENCOUNTER — Ambulatory Visit (INDEPENDENT_AMBULATORY_CARE_PROVIDER_SITE_OTHER): Payer: 59 | Admitting: Internal Medicine

## 2016-11-25 VITALS — BP 128/82 | HR 70 | Temp 97.6°F | Resp 14 | Ht 76.0 in | Wt 277.0 lb

## 2016-11-25 DIAGNOSIS — Z09 Encounter for follow-up examination after completed treatment for conditions other than malignant neoplasm: Secondary | ICD-10-CM

## 2016-11-25 DIAGNOSIS — Z0000 Encounter for general adult medical examination without abnormal findings: Secondary | ICD-10-CM | POA: Diagnosis not present

## 2016-11-25 DIAGNOSIS — R002 Palpitations: Secondary | ICD-10-CM

## 2016-11-25 LAB — LIPID PANEL
CHOLESTEROL: 195 mg/dL (ref 0–200)
HDL: 31.4 mg/dL — AB (ref >39.00)
NonHDL: 163.12
TRIGLYCERIDES: 271 mg/dL — AB (ref 0.0–149.0)
Total CHOL/HDL Ratio: 6
VLDL: 54.2 mg/dL — ABNORMAL HIGH (ref 0.0–40.0)

## 2016-11-25 LAB — COMPREHENSIVE METABOLIC PANEL
ALT: 33 U/L (ref 0–53)
AST: 19 U/L (ref 0–37)
Albumin: 4.7 g/dL (ref 3.5–5.2)
Alkaline Phosphatase: 43 U/L (ref 39–117)
BILIRUBIN TOTAL: 0.9 mg/dL (ref 0.2–1.2)
BUN: 14 mg/dL (ref 6–23)
CO2: 29 mEq/L (ref 19–32)
CREATININE: 1.01 mg/dL (ref 0.40–1.50)
Calcium: 9.7 mg/dL (ref 8.4–10.5)
Chloride: 103 mEq/L (ref 96–112)
GFR: 82.63 mL/min (ref >60.00)
GLUCOSE: 104 mg/dL — AB (ref 70–99)
Potassium: 4.4 mEq/L (ref 3.5–5.1)
Sodium: 138 mEq/L (ref 135–145)
TOTAL PROTEIN: 6.6 g/dL (ref 6.0–8.3)

## 2016-11-25 LAB — PSA: PSA: 1.07 ng/mL (ref 0.10–4.00)

## 2016-11-25 LAB — CBC WITH DIFFERENTIAL/PLATELET
BASOS PCT: 0.5 % (ref 0.0–3.0)
Basophils Absolute: 0 10*3/uL (ref 0.0–0.1)
EOS PCT: 2.2 % (ref 0.0–5.0)
Eosinophils Absolute: 0.2 10*3/uL (ref 0.0–0.7)
HCT: 46.6 % (ref 39.0–52.0)
HEMOGLOBIN: 16 g/dL (ref 13.0–17.0)
LYMPHS ABS: 2 10*3/uL (ref 0.7–4.0)
Lymphocytes Relative: 28.3 % (ref 12.0–46.0)
MCHC: 34.3 g/dL (ref 30.0–36.0)
MCV: 85.9 fl (ref 78.0–100.0)
MONO ABS: 0.4 10*3/uL (ref 0.1–1.0)
Monocytes Relative: 6 % (ref 3.0–12.0)
NEUTROS ABS: 4.4 10*3/uL (ref 1.4–7.7)
NEUTROS PCT: 63 % (ref 43.0–77.0)
Platelets: 257 10*3/uL (ref 150.0–400.0)
RBC: 5.43 Mil/uL (ref 4.22–5.81)
RDW: 14.1 % (ref 11.5–15.5)
WBC: 7 10*3/uL (ref 4.0–10.5)

## 2016-11-25 LAB — HEMOGLOBIN A1C: HEMOGLOBIN A1C: 5.7 % (ref 4.6–6.5)

## 2016-11-25 LAB — LDL CHOLESTEROL, DIRECT: Direct LDL: 104 mg/dL

## 2016-11-25 LAB — TSH: TSH: 2.79 u[IU]/mL (ref 0.35–4.50)

## 2016-11-25 NOTE — Progress Notes (Signed)
Pre visit review using our clinic review tool, if applicable. No additional management support is needed unless otherwise documented below in the visit note. 

## 2016-11-25 NOTE — Progress Notes (Signed)
Subjective:    Patient ID: Dylan Hatfield, male    DOB: Mar 01, 1966, 51 y.o.   MRN: [id number]  DOS:  11/25/2016 Type of visit - description : Acute visit Interval history: Patient has palpitations for over a year (see last OV), on and off, episodes are completely random, sometimes weekly sometimes once a month. Is mild butsometimes intense, described as his heart pounding. Onset of episodes is gradual and so is ending. With intense episodes, he has some throat discomfort but never has chest pain, near syncope feeling. If anything he could be slightly lightheaded. No associated to anxiety, exertion or meals. Denies take gain any OTC decongestants except for occ Benadryl. Medications list reviewed, occasional takes hydrocodone and a muscle relaxant.   Review of Systems see above  Past Medical History:  Diagnosis Date  . Arthritis   . Chronic back pain    bilateral L5/S1 transforaminal lumbar epidural steroid injections, Dr. [name]  . DDD (degenerative disc disease), lumbosacral   . Lumbosacral spondylosis   . Sleep apnea    on CPAP, dx ~ 2008     Past Surgical History:  Procedure Laterality Date  . VASECTOMY      Social History   Social History  . Marital status: Married    Spouse name: N/A  . Number of children: 2  . Years of education: N/A   Occupational History  . Occupation-- Public librarian Com   Social History Main Topics  . Smoking status: Former Research scientist (life sciences)  . Smokeless tobacco: Former Systems developer     Comment: 1 ppd, quit 2003  . Alcohol use No  . Drug use: No  . Sexual activity: Yes   Other Topics Concern  . Not on file   Social History Narrative  . No narrative on file      Allergies as of 11/25/2016   No Known Allergies     Medication List       Accurate as of 11/25/16  9:40 PM. Always use your most recent med list.          ADVIL 200 MG tablet Generic drug:  ibuprofen Take 200 mg by mouth every 6 (six) hours as needed.     baclofen 10 MG tablet Commonly known as:  LIORESAL Take 10 mg by mouth as needed for muscle spasms.   HYDROcodone-acetaminophen 10-325 MG tablet Commonly known as:  NORCO Take 1 tablet by mouth as needed.          Objective:   Physical Exam BP 128/82 (BP Location: Left Arm, Patient Position: Sitting, Cuff Size: Normal)   Pulse 70   Temp 97.6 F (36.4 C) (Oral)   Resp 14   Ht 6\' 4"  (1.93 m)   Wt 277 lb (125.6 kg)   SpO2 97%   BMI 33.72 kg/m  General:   Well developed, well nourished . NAD.  HEENT:  Normocephalic . Face symmetric, atraumatic Neck: No thyromegaly Lungs:  CTA B Normal respiratory effort, no intercostal retractions, no accessory muscle use. Heart: RRR,  no murmur.  no pretibial edema bilaterally  Abdomen:  Not distended, soft, non-tender. No rebound or rigidity.  Skin: Not pale. Not jaundice Neurologic:  alert & oriented X3.  Speech normal, gait appropriate for age and unassisted Psych--  Cognition and judgment appear intact.  Cooperative with normal attention span and concentration.  Behavior appropriate. No anxious or depressed appearing.    Assessment & Plan:    Assessment  OSA  Dx 2008, on CPAP Chronic back pain-- sees pain mngmt  LUTS (nocturia x years, flomax caused retrograde ejaculation) Bony mass deformity of the chest wall, XR 06-2014 -- benign changes Lipoma: At the mid back, diameter 10 cm     PLAN      Palpitations: As described above, occasionally intense sx. EKG today sinus rhythm with frequent PACs. I believe the patient needs further evaluation, we are doing labs in preparation for his CPX that include a CBC and a TSH. We'll get a echo (failed referral before) and a cardiology referral. ER if severe symptoms or associated chest pain, near syncope.

## 2016-11-25 NOTE — Patient Instructions (Signed)
GO TO THE LAB : Get the blood work     Go to the ER if you have severe symptoms or if you get near syncope, chest pain or difficulty breathing

## 2016-11-25 NOTE — Assessment & Plan Note (Addendum)
Palpitations: As described above, occasionally intense sx. EKG today sinus rhythm with frequent PACs. I believe the patient needs further evaluation, we are doing labs in preparation for his CPX that include a CBC and a TSH. We'll get a echo (failed referral before) and a cardiology referral. ER if severe symptoms or associated chest pain, near syncope.

## 2016-12-06 DIAGNOSIS — M5137 Other intervertebral disc degeneration, lumbosacral region: Secondary | ICD-10-CM | POA: Diagnosis not present

## 2016-12-06 DIAGNOSIS — M549 Dorsalgia, unspecified: Secondary | ICD-10-CM | POA: Diagnosis not present

## 2016-12-06 DIAGNOSIS — M47817 Spondylosis without myelopathy or radiculopathy, lumbosacral region: Secondary | ICD-10-CM | POA: Diagnosis not present

## 2016-12-08 ENCOUNTER — Ambulatory Visit (INDEPENDENT_AMBULATORY_CARE_PROVIDER_SITE_OTHER): Payer: 59 | Admitting: Internal Medicine

## 2016-12-08 ENCOUNTER — Encounter: Payer: Self-pay | Admitting: Gastroenterology

## 2016-12-08 ENCOUNTER — Encounter: Payer: Self-pay | Admitting: Internal Medicine

## 2016-12-08 VITALS — BP 124/68 | HR 69 | Temp 98.1°F | Resp 14 | Ht 76.0 in | Wt 279.5 lb

## 2016-12-08 DIAGNOSIS — G4733 Obstructive sleep apnea (adult) (pediatric): Secondary | ICD-10-CM

## 2016-12-08 DIAGNOSIS — Z1211 Encounter for screening for malignant neoplasm of colon: Secondary | ICD-10-CM

## 2016-12-08 DIAGNOSIS — Z0000 Encounter for general adult medical examination without abnormal findings: Secondary | ICD-10-CM

## 2016-12-08 NOTE — Progress Notes (Signed)
Pre visit review using our clinic review tool, if applicable. No additional management support is needed unless otherwise documented below in the visit note. 

## 2016-12-08 NOTE — Assessment & Plan Note (Addendum)
Td 2010  CCS: Never had a cscope, on exam, question of tiny polyp at the right side of the rectum. Due for his first screening, refer to GI Prostate cancer screening: DRE (-) today , recent PSA wnl Labs: Recent results reviewed w/ pt  Diet-exercise discussed

## 2016-12-08 NOTE — Patient Instructions (Addendum)
Will refer you to neurology for CPAP management  Will refer you for a colonoscopy, you have a tiny polyp on the rectum when I did the prostate exam.  Next visit in one year, physical exam

## 2016-12-08 NOTE — Progress Notes (Signed)
Subjective:    Patient ID: Dylan Hatfield, male    DOB: [birth year], 51 y.o.   MRN: [id number]  DOS:  12/08/2016 Type of visit - description :Physical exam Interval history: No major concerns   Review of Systems Has on and off trigger finger phenomena, most fingers are affected more so the second and third on each hand. Needs new OSA equipment. Other than above, a 14 point review of systems is negative     Past Medical History:  Diagnosis Date  . Arthritis   . Chronic back pain    bilateral L5/S1 transforaminal lumbar epidural steroid injections, Dr. [name]  . DDD (degenerative disc disease), lumbosacral   . Lumbosacral spondylosis   . Sleep apnea    on CPAP, dx ~ 2008     Past Surgical History:  Procedure Laterality Date  . VASECTOMY      Social History   Social History  . Marital status: Married    Spouse name: N/A  . Number of children: 2  . Years of education: N/A   Occupational History  . Occupation-- Public librarian Com   Social History Main Topics  . Smoking status: Former Research scientist (life sciences)  . Smokeless tobacco: Former Systems developer     Comment: 1 ppd, quit 2003  . Alcohol use No  . Drug use: No  . Sexual activity: Yes   Other Topics Concern  . Not on file   Social History Narrative  . No narrative on file      Allergies as of 12/08/2016   No Known Allergies     Medication List       Accurate as of 12/08/16  1:16 PM. Always use your most recent med list.          ADVIL 200 MG tablet Generic drug:  ibuprofen Take 200 mg by mouth every 6 (six) hours as needed.   baclofen 10 MG tablet Commonly known as:  LIORESAL Take 10 mg by mouth as needed for muscle spasms.   HYDROcodone-acetaminophen 10-325 MG tablet Commonly known as:  NORCO Take 1 tablet by mouth as needed.          Objective:   Physical Exam BP 124/68 (BP Location: Left Arm, Patient Position: Sitting, Cuff Size: Normal)   Pulse 69   Temp 98.1 F (36.7 C)  (Oral)   Resp 14   Ht 6\' 4"  (1.93 m)   Wt 279 lb 8 oz (126.8 kg)   SpO2 97%   BMI 34.02 kg/m   General:   Well developed, well nourished . NAD.  Neck: No  thyromegaly  HEENT:  Normocephalic . Face symmetric, atraumatic Lungs:  CTA B Normal respiratory effort, no intercostal retractions, no accessory muscle use. Heart: RRR,  no murmur.  No pretibial edema bilaterally  Abdomen:  Not distended, soft, non-tender. No rebound or rigidity.   MSK: Hands normal to inspection on palpation, I did notice a trigger finger on digit #2 and 3 bilaterally Skin: Exposed areas without rash. Not pale. Not jaundice Rectal:  External abnormalities: none. Normal sphincter tone. At the right side of the rectum he seems to have a tiny polyp.  Stools not  found  Prostate: Prostate gland firm and smooth, no enlargement, nodularity, tenderness, mass, asymmetry or induration.  Neurologic:  alert & oriented X3.  Speech normal, gait appropriate for age and unassisted Strength symmetric and appropriate for age.  Psych: Cognition and judgment appear intact.  Cooperative with normal attention  span and concentration.  Behavior appropriate. No anxious or depressed appearing.    Assessment & Plan:    Assessment  OSA   Dx 2008, on CPAP MSK: DJD Trigger fingers  Chronic back pain-- sees pain mngmt  LUTS (nocturia x years, flomax caused retrograde ejaculation) Bony mass deformity of the chest wall, XR 06-2014 -- benign changes Lipoma: At the mid back, diameter 10 cm     PLAN      OSA: Needs new equipment and reevaluation, refer to neurology Trigger fingers: Not very bothersome, exam is confirmatory , rec  observation. When he decides treatment is needed we could refer him for a local injection. Palpitations: No recent symptoms, workup pending RTC one year

## 2016-12-09 NOTE — Assessment & Plan Note (Signed)
OSA: Needs new equipment and reevaluation, refer to neurology Trigger fingers: Not very bothersome, exam is confirmatory , rec  observation. When he decides treatment is needed we could refer him for a local injection. Palpitations: No recent symptoms, workup pending RTC one year

## 2016-12-10 ENCOUNTER — Other Ambulatory Visit: Payer: Self-pay

## 2016-12-10 ENCOUNTER — Ambulatory Visit (HOSPITAL_COMMUNITY): Payer: 59 | Attending: Cardiology

## 2016-12-10 DIAGNOSIS — I77810 Thoracic aortic ectasia: Secondary | ICD-10-CM | POA: Insufficient documentation

## 2016-12-10 DIAGNOSIS — I517 Cardiomegaly: Secondary | ICD-10-CM | POA: Diagnosis not present

## 2016-12-10 DIAGNOSIS — R002 Palpitations: Secondary | ICD-10-CM | POA: Insufficient documentation

## 2016-12-13 ENCOUNTER — Encounter: Payer: Self-pay | Admitting: Cardiology

## 2016-12-13 ENCOUNTER — Ambulatory Visit (INDEPENDENT_AMBULATORY_CARE_PROVIDER_SITE_OTHER): Payer: 59 | Admitting: Cardiology

## 2016-12-13 VITALS — BP 124/86 | HR 78 | Ht 76.0 in | Wt 273.6 lb

## 2016-12-13 DIAGNOSIS — R002 Palpitations: Secondary | ICD-10-CM | POA: Diagnosis not present

## 2016-12-13 NOTE — Progress Notes (Signed)
Electrophysiology Office Note   Date:  12/13/2016   ID:  Dylan Hatfield, DOB 18-Jun-1966, MRN [id number]  PCP:  Kathlene November, MD Primary Electrophysiologist:  Cecylia Brazill [name], MD    Chief Complaint  Patient presents with  . New Patient (Initial Visit)    palpitations     History of Present Illness: Dylan Hatfield is a 51 y.o. male who presents today for electrophysiology evaluation.   He presents today with complaints of palpitations. He says that there is no exacerbating or alleviating factor to the palpitations. He says that at times they happen a few times a week, and there is a few months break. They can happen at any time of day and they have awoken him at night. He otherwise feels well without complaint.   Today, he denies symptoms of palpitations, chest pain, shortness of breath, orthopnea, PND, lower extremity edema, claudication, dizziness, presyncope, syncope, bleeding, or neurologic sequela. The patient is tolerating medications without difficulties and is otherwise without complaint today.    Past Medical History:  Diagnosis Date  . Arthritis   . Chronic back pain    bilateral L5/S1 transforaminal lumbar epidural steroid injections, Dr. [name]  . DDD (degenerative disc disease), lumbosacral   . Lumbosacral spondylosis   . Sleep apnea    on CPAP, dx ~ 2008    Past Surgical History:  Procedure Laterality Date  . VASECTOMY       Current Outpatient Prescriptions  Medication Sig Dispense Refill  . baclofen (LIORESAL) 10 MG tablet Take 10 mg by mouth as needed for muscle spasms.    [name] HYDROcodone-acetaminophen (NORCO) 10-325 MG per tablet Take 1 tablet by mouth as needed.    [name] ibuprofen (ADVIL) 200 MG tablet Take 200 mg by mouth every 6 (six) hours as needed.     No current facility-administered medications for this visit.     Allergies:   Patient has no known allergies.   Social History:  The patient  reports that he has quit smoking. He has quit using  smokeless tobacco. He reports that he does not drink alcohol or use drugs.   Family History:  The patient's family history includes Arrhythmia in his maternal aunt; Breast cancer in his other; Stroke in his other.    ROS:  Please see the history of present illness.   Otherwise, review of systems is positive for none.   All other systems are reviewed and negative.    PHYSICAL EXAM: VS:  BP 124/86   Pulse 78   Ht 6\' 4"  (1.93 m)   Wt 273 lb 9.6 oz (124.1 kg)   BMI 33.30 kg/m  , BMI Body mass index is 33.3 kg/m. GEN: Well nourished, well developed, in no acute distress  HEENT: normal  Neck: no JVD, carotid bruits, or masses Cardiac: RRR; no murmurs, rubs, or gallops,no edema  Respiratory:  clear to auscultation bilaterally, normal work of breathing GI: soft, nontender, nondistended, + BS MS: no deformity or atrophy  Skin: warm and dry Neuro:  Strength and sensation are intact Psych: euthymic mood, full affect  EKG:  EKG is not ordered today. Personal review of the ekg ordered 11/25/16 shows sinus rhythm, iRBBB, PACs   Recent Labs: 11/25/2016: ALT 33; BUN 14; Creatinine, Ser 1.01; Hemoglobin 16.0; Platelets 257.0; Potassium 4.4; Sodium 138; TSH 2.79    Lipid Panel     Component Value Date/Time   CHOL 195 11/25/2016 0915   TRIG 271.0 (H) 11/25/2016 0915  HDL 31.40 (L) 11/25/2016 0915   CHOLHDL 6 11/25/2016 0915   VLDL 54.2 (H) 11/25/2016 0915   LDLCALC 164 (H) 06/17/2015 1636   LDLDIRECT 104.0 11/25/2016 0915     Wt Readings from Last 3 Encounters:  12/13/16 273 lb 9.6 oz (124.1 kg)  12/08/16 279 lb 8 oz (126.8 kg)  11/25/16 277 lb (125.6 kg)      Other studies Reviewed: Additional studies/ records that were reviewed today include: TTE 12/10/16  Review of the above records today demonstrates:  - Left ventricle: The cavity size was moderately dilated. There was   mild concentric hypertrophy. Systolic function was normal. The   estimated ejection fraction was in the  range of 60% to 65%. Wall   motion was normal; there were no regional wall motion   abnormalities. Left ventricular diastolic function parameters   were normal. - Aorta: Aortic root dimension: 38 mm (ED). - Aortic root: The aortic root was mildly dilated. - Left atrium: The atrium was mildly dilated. - Pulmonary arteries: Systolic pressure could not be accurately   estimated.   ASSESSMENT AND PLAN:  1.  Palpitations: Early is unclear as to the causes palpitations. His EKG shows sinus rhythm with APCs, and his echocardiogram shows a normal ejection fraction. I discussed with him the possibility of worsening monitor versus Linq monitoring for diagnosis. He is unsure if he wants to do this, and I have thus suggested him get the alive cor for further monitoring. As we do not have a firm diagnosis, no medications Betzabeth Derringer be started at this time.    Current medicines are reviewed at length with the patient today.   The patient does not have concerns regarding his medicines.  The following changes were made today:  none  Labs/ tests ordered today include:  No orders of the defined types were placed in this encounter.    Disposition:   FU with Mychaela Lennartz 3 months  Signed, Calandria Mullings [name], MD  12/13/2016 3:28 PM     [facility] [address] [place] [address] [phone] (office) [phone] (fax)

## 2016-12-13 NOTE — Patient Instructions (Signed)
Medication Instructions:    Your physician recommends that you continue on your current medications as directed. Please refer to the Current Medication list given to you today.  --- If you need a refill on your cardiac medications before your next appointment, please call your pharmacy. ---  Labwork:  None ordered  Testing/Procedures:  None ordered  Follow-Up:  Your physician recommends that you schedule a follow-up appointment in: 3 months with Dr. Curt Bears.  Thank you for choosing [facility]!!   Trinidad Curet, RN [phone]   Any Other Special Instructions Will Be Listed Below (If Applicable).   [facility]

## 2016-12-15 ENCOUNTER — Encounter: Payer: Self-pay | Admitting: Neurology

## 2016-12-15 ENCOUNTER — Ambulatory Visit (INDEPENDENT_AMBULATORY_CARE_PROVIDER_SITE_OTHER): Payer: 59 | Admitting: Neurology

## 2016-12-15 VITALS — BP 160/80 | HR 80 | Resp 20 | Ht 76.0 in | Wt 273.0 lb

## 2016-12-15 DIAGNOSIS — G4733 Obstructive sleep apnea (adult) (pediatric): Secondary | ICD-10-CM

## 2016-12-15 DIAGNOSIS — R0683 Snoring: Secondary | ICD-10-CM

## 2016-12-15 DIAGNOSIS — R002 Palpitations: Secondary | ICD-10-CM

## 2016-12-15 DIAGNOSIS — Z9989 Dependence on other enabling machines and devices: Secondary | ICD-10-CM

## 2016-12-15 NOTE — Progress Notes (Signed)
SLEEP MEDICINE CLINIC   Provider:  Larey Hatfield, M D  Referring Provider: Colon Branch, MD Primary Care Physician:  Dylan November, MD  Chief Complaint  Patient presents with  . New Patient (Initial Visit)    on cpap, uses [facility], had sleep sutdy 12 years ago    HPI:  Dylan Hatfield is a 51 y.o. male , seen here as a referral from Dr. Larose Kells for a sleep consultation,  [name] has been using CPAP for a dozen years, and continues to be compliant. He does need new supplies and probably a new machine. He has a history of arthritis, chronic back pain, degenerative disc disease in the lumbosacral region, spondylosis and sleep apnea. 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 His only regular taken medication is ibuprofen.  Sleep habits are as follows: He has no trouble initiating sleep in his usual bedtime is around 9 PM. He prefers to sleep on his side, uses 3 pillows. His bedroom is dark, cool and quiet. His wife is sharing the bedroom and has not noted any sleep behaviors. He has been using CPAP compliantly, he does not snore while using CPAP. He has usually 2-3 bathroom breaks each night, but he is able to fall back asleep promptly. He rises in the morning at 5 AM, after an estimate of 7 hours of nocturnal sleep. He has developed morning headaches in the center of his forehead, he wakes now up with a dry mouth which wasn't the case a year ago. This may be well related to the failure to humidify the CPAP and an ill fitting facial interface. He has always used a fullface mask. He was last given a fresh mask about a year ago. [facility].   Sleep medical history and family sleep history:  He strongly believes his father has obstructive sleep apnea but he has never been diagnosed or treated. He does not think his brother has apnea. His children also has not developed apnea. No family history of insomnia or restless legs. He was a sleep walker in childhood, no history of night terrors and no  enuresis.   Social history:  Married , 2 children, non smoker- quit in his youth, non ETOH drinker , since 11-2009, caffeine - 2 cups in AM , no soda, rarely iced tea.   Review of Systems: Out of a complete 14 system review, the patient complains of only the following symptoms, and all other reviewed systems are negative. palpitation, some at night. Cardio work up , arranged for cardiac monitor.   Epworth score 5 , Fatigue severity score 29  , depression score n/a    Social History   Social History  . Marital status: Married    Spouse name: N/A  . Number of children: 2  . Years of education: N/A   Occupational History  . Occupation-- Public librarian Com   Social History Main Topics  . Smoking status: Former Research scientist (life sciences)  . Smokeless tobacco: Former Systems developer     Comment: 1 ppd, quit 2003  . Alcohol use No  . Drug use: No  . Sexual activity: Yes   Other Topics Concern  . Not on file   Social History Narrative   Daughter in [place]    Son in [place]    Family History  Problem Relation Age of Onset  . Stroke Other     GM stroke 29  . Breast cancer Other  GM  . Arrhythmia Maternal Aunt   . Diabetes Neg Hx   . CAD Neg Hx   . Dylan cancer Neg Hx   . Prostate cancer Neg Hx     Past Medical History:  Diagnosis Date  . Arthritis   . Chronic back pain    bilateral L5/S1 transforaminal lumbar epidural steroid injections, Dr. [name]  . DDD (degenerative disc disease), lumbosacral   . Lumbosacral spondylosis   . Sleep apnea    on CPAP, dx ~ 2008     Past Surgical History:  Procedure Laterality Date  . VASECTOMY      Current Outpatient Prescriptions  Medication Sig Dispense Refill  . baclofen (LIORESAL) 10 MG tablet Take 10 mg by mouth as needed for muscle spasms.    [name] HYDROcodone-acetaminophen (NORCO) 10-325 MG per tablet Take 1 tablet by mouth as needed.    [name] ibuprofen (ADVIL) 200 MG tablet Take 200 mg by mouth every 6 (six) hours as needed.      No current facility-administered medications for this visit.     Allergies as of 12/15/2016  . (No Known Allergies)    Vitals: BP (!) 160/80   Pulse 80   Resp 20   Ht 6\' 4"  (1.93 m)   Wt 273 lb (123.8 kg)   BMI 33.23 kg/m  Last Weight:  Wt Readings from Last 1 Encounters:  12/15/16 273 lb (123.8 kg)   DVV:OHYW mass index is 33.23 kg/m.     Last Height:   Ht Readings from Last 1 Encounters:  12/15/16 6\' 4"  (1.93 m)    Physical exam:  General: The patient is awake, alert and appears not in acute distress. The patient is well groomed. Head: Normocephalic, atraumatic. Neck is supple. Mallampati 5,  neck circumference 18.25 . Nasal airflow patent, Retrognathia is not seen, but a crowded dental status. .  Cardiovascular:  Regular rate and rhythm , without  murmurs or carotid bruit, and without distended neck veins. Respiratory: Lungs are clear to auscultation. Skin:  Without evidence of edema, or rash Trunk: BMI is 33. The patient's posture is erect    Neurologic exam : The patient is awake and alert, oriented to place and time.   Memory subjective  described as intact.  Attention span & concentration ability appears normal.  Speech is fluent,  without dysarthria, dysphonia or aphasia.  Mood and affect are appropriate.  Cranial nerves: Pupils are equal and briskly reactive to light. Extraocular movements  in vertical and horizontal planes intact and without nystagmus. Visual fields by finger perimetry are intact. Hearing to finger rub intact. Facial sensation intact to fine touch.Facial motor strength is symmetric and tongue and uvula move midline. Shoulder shrug was symmetrical.   Motor exam:  Normal tone, muscle bulk and symmetric strength in all extremities.Sensory:  Fine touch, pinprick and vibration were tested in all extremities. Proprioception tested in the upper extremities was normal. Coordination: Rapid alternating movements in the fingers/hands was normal.  Finger-to-nose maneuver  normal without evidence of ataxia, dysmetria or tremor. Gait and station: Patient walks without assistive device and is able unassisted to climb up to the exam table. Strength within normal limits. Stance is stable and normal. Deep tendon reflexes: in the  upper and lower extremities are symmetric and intact. Babinski maneuver response is downgoing.  The patient was advised of the nature of the diagnosed OSA sleep disorder , the treatment options  ( dental device and CPAP, BiPAP  as well as behavior modification,  weigh loss ) and risks for general a health and wellness arising from not treating the condition. I will check especially for a correlation of apnea and  Palpitation.  Mr. Fanning's degree of apnea has not been recently evaluated, and I do not have any compliance data.  Since his machine is so old that it does not produce therapeutic data he will need to get a new one anyway, I will be happy to send him home with a home sleep test to evaluate the current degree of apnea, this will be followed by an auto- titration.  I spent more than 30 minutes of face to face time with the patient. Greater than 50% of time was spent in counseling and coordination of care. We have discussed the diagnosis and differential and I answered the patient's questions.     Assessment:  After physical and neurologic examination, review of laboratory studies,  Personal review of imaging studies, reports of other /same  Imaging studies ,  Results of polysomnography/ neurophysiology testing and pre-existing records as far as provided in visit., my assessment is   1)  OSA - this condition has been present for well over a decade and the patient has not lost weight, has not had ENT surgeries or other procedures that could have resolved the condition.   Need HST to document baseline and auto titration 5-20 cm water with a new mask . FFM in large size  is his preferred interface . He would like to try one  without a frontalis resting  point .    Asencion Partridge Nellie Chevalier MD  12/15/2016   CC: Dylan Hatfield, [address] [place], [place] [id number]

## 2016-12-21 DIAGNOSIS — Z79899 Other long term (current) drug therapy: Secondary | ICD-10-CM | POA: Diagnosis not present

## 2016-12-21 DIAGNOSIS — G894 Chronic pain syndrome: Secondary | ICD-10-CM | POA: Diagnosis not present

## 2016-12-21 DIAGNOSIS — M5137 Other intervertebral disc degeneration, lumbosacral region: Secondary | ICD-10-CM | POA: Diagnosis not present

## 2016-12-21 DIAGNOSIS — Z79891 Long term (current) use of opiate analgesic: Secondary | ICD-10-CM | POA: Diagnosis not present

## 2016-12-21 DIAGNOSIS — M47817 Spondylosis without myelopathy or radiculopathy, lumbosacral region: Secondary | ICD-10-CM | POA: Diagnosis not present

## 2017-01-03 ENCOUNTER — Institutional Professional Consult (permissible substitution): Payer: 59 | Admitting: Neurology

## 2017-01-03 DIAGNOSIS — G894 Chronic pain syndrome: Secondary | ICD-10-CM | POA: Diagnosis not present

## 2017-01-03 DIAGNOSIS — M5136 Other intervertebral disc degeneration, lumbar region: Secondary | ICD-10-CM | POA: Diagnosis not present

## 2017-01-17 ENCOUNTER — Ambulatory Visit (AMBULATORY_SURGERY_CENTER): Payer: Self-pay

## 2017-01-17 ENCOUNTER — Ambulatory Visit (INDEPENDENT_AMBULATORY_CARE_PROVIDER_SITE_OTHER): Payer: 59 | Admitting: Neurology

## 2017-01-17 ENCOUNTER — Telehealth: Payer: Self-pay

## 2017-01-17 VITALS — Ht 76.0 in | Wt 272.2 lb

## 2017-01-17 DIAGNOSIS — Z1211 Encounter for screening for malignant neoplasm of colon: Secondary | ICD-10-CM

## 2017-01-17 DIAGNOSIS — G4733 Obstructive sleep apnea (adult) (pediatric): Secondary | ICD-10-CM | POA: Diagnosis not present

## 2017-01-17 DIAGNOSIS — Z9989 Dependence on other enabling machines and devices: Principal | ICD-10-CM

## 2017-01-17 MED ORDER — NA SULFATE-K SULFATE-MG SULF 17.5-3.13-1.6 GM/177ML PO SOLN: 1.0000 | Freq: Once | ORAL | 0 refills | Status: AC

## 2017-01-17 NOTE — Progress Notes (Signed)
Denies allergies to eggs or soy products. Denies complication of anesthesia or sedation. Denies use of weight loss medication. Denies use of O2.   Emmi instructions given for colonoscopy.  

## 2017-01-17 NOTE — Telephone Encounter (Signed)
Set up patients Home study to get new equipment. I fitted him with a medium Res Med Air Touch F20. He will need this scripted when order is place. He would like to switch to [facility].

## 2017-01-18 ENCOUNTER — Encounter: Payer: Self-pay | Admitting: Gastroenterology

## 2017-01-25 NOTE — Telephone Encounter (Signed)
Medium air touch ResMed F 20. Ordered. Can switch to [facility] with new orders.

## 2017-01-26 NOTE — Addendum Note (Signed)
Addended by: Larey Seat on: 01/26/2017 08:28 AM   Modules accepted: Orders

## 2017-01-26 NOTE — Telephone Encounter (Signed)
-----   Message from Larey Seat, MD sent at 01/26/2017  8:28 AM EDT ----- New CPAP will be prescribed for patient with mild OSA , using CPAP with good resolution of  Sleepiness. CPAP  autotitrator. 5-15 cm water.

## 2017-01-26 NOTE — Telephone Encounter (Signed)
I called pt. I advised pt that Dr. Brett Fairy reviewed their sleep study results and found that pt has mild osa. Dr. Brett Fairy recommends that pt start a new cpap at home. I reviewed PAP compliance expectations with the pt. Pt is agreeable to starting a CPAP. Pt wants to switch DMEs to [facility]. I advised pt that an order will be sent to [facility], and [facility] will call the pt within about one week after they file with the pt's insurance. [facility] will show the pt how to use the machine, fit for masks, and troubleshoot the CPAP if needed. A follow up appt was made for insurance purposes with Dr. Brett Fairy on Thursday, 05/12/2017 at10:30am. Pt verbalized understanding to arrive 15 minutes early and bring their CPAP. A letter with all of this information in it will be mailed to the pt as a reminder. I verified with the pt that the address we have on file is correct. Pt verbalized understanding of results. Pt had no questions at this time but was encouraged to call back if questions arise.

## 2017-01-26 NOTE — Procedures (Signed)
NAME: Dylan Hatfield  DOB: December 27, 1965 MEDICAL RECORD [id number]  DOS: 01/22/2017 REFERRING PHYSICIAN: Kathlene November, MD Study performed: HST/Out of Center Sleep test HISTORY: 51 y.o. male patient has been using CPAP for a dozen years, and continues to be compliant. He does need new supplies and probably a new CPAP machine. He has a history of arthritis, chronic back pain, and degenerative disc disease in the lumbosacral region, spondylosis and sleep apnea. BMI 33.2, Epworth 5 points on CPAP.  STUDY RESULTS: Total Recording Time: 6h 62min, Total Apnea/Hypopnea Index (AHI): 5.9/hr.  Average Oxygen Saturation: SpO2 94%, Lowest Oxygen Saturation: SpO2 nadir was 87%. Hypoxemia time was 1 minute. Average Heart Rate: 83 bpm ( 47- 116 bpm )   IMPRESSION; Mild sleep apnea, obstructive. Patient feels improvement on CPAP and therefor shall be issued a new machine to continue therapy.   RECOMMENDATION: CPAP to be issued as auto-titrator , pressure window of 5-15 cm water with 3 cm EPR, interface of patient's choice.   I certify that I have reviewed the raw data recording prior to the issuance of this report in accordance with the standards of Accreditation of the American Academy of Sleep medicine (AASM) Larey Seat, MD   01-26-2017  Diplomat, American Board of Psychiatry and Neurology Diplomat, American Board of Sleep Medicine Medical Director of [facility] [employer]

## 2017-01-27 ENCOUNTER — Telehealth: Payer: Self-pay | Admitting: Gastroenterology

## 2017-01-27 MED ORDER — SUPREP BOWEL PREP KIT 17.5-3.13-1.6 GM/177ML PO SOLN: 1.0000 | Freq: Once | ORAL | 0 refills | Status: AC

## 2017-01-31 ENCOUNTER — Encounter: Payer: Self-pay | Admitting: Gastroenterology

## 2017-01-31 ENCOUNTER — Ambulatory Visit (AMBULATORY_SURGERY_CENTER): Payer: 59 | Admitting: Gastroenterology

## 2017-01-31 VITALS — BP 143/93 | HR 59 | Temp 98.7°F | Resp 14 | Ht 76.0 in | Wt 272.0 lb

## 2017-01-31 DIAGNOSIS — Z1211 Encounter for screening for malignant neoplasm of colon: Secondary | ICD-10-CM | POA: Diagnosis present

## 2017-01-31 DIAGNOSIS — D128 Benign neoplasm of rectum: Secondary | ICD-10-CM

## 2017-01-31 DIAGNOSIS — Z1212 Encounter for screening for malignant neoplasm of rectum: Secondary | ICD-10-CM

## 2017-01-31 DIAGNOSIS — D123 Benign neoplasm of transverse colon: Secondary | ICD-10-CM | POA: Diagnosis not present

## 2017-01-31 DIAGNOSIS — K621 Rectal polyp: Secondary | ICD-10-CM | POA: Diagnosis not present

## 2017-01-31 MED ORDER — SODIUM CHLORIDE 0.9 % IV SOLN: 500.0000 mL | INTRAVENOUS | Status: DC

## 2017-01-31 NOTE — Progress Notes (Signed)
Called to room to assist during endoscopic procedure.  Patient ID and intended procedure confirmed with present staff. Received instructions for my participation in the procedure from the performing physician.  

## 2017-01-31 NOTE — Progress Notes (Signed)
Report to PACU, RN, vss, BBS= Clear.  

## 2017-01-31 NOTE — Patient Instructions (Signed)
YOU HAD AN ENDOSCOPIC PROCEDURE TODAY AT THE [facility]:   Refer to the procedure report that was given to you for any specific questions about what was found during the examination.  If the procedure report does not answer your questions, please call your gastroenterologist to clarify.  If you requested that your care partner not be given the details of your procedure findings, then the procedure report has been included in a sealed envelope for you to review at your convenience later.  YOU SHOULD EXPECT: Some feelings of bloating in the abdomen. Passage of more gas than usual.  Walking can help get rid of the air that was put into your GI tract during the procedure and reduce the bloating. If you had a lower endoscopy (such as a colonoscopy or flexible sigmoidoscopy) you may notice spotting of blood in your stool or on the toilet paper. If you underwent a bowel prep for your procedure, you may not have a normal bowel movement for a few days.  Please Note:  You might notice some irritation and congestion in your nose or some drainage.  This is from the oxygen used during your procedure.  There is no need for concern and it should clear up in a day or so.  SYMPTOMS TO REPORT IMMEDIATELY:   Following lower endoscopy (colonoscopy or flexible sigmoidoscopy):  Excessive amounts of blood in the stool  Significant tenderness or worsening of abdominal pains  Swelling of the abdomen that is new, acute  Fever of 100F or higher  For urgent or emergent issues, a gastroenterologist can be reached at any hour by calling [phone].   DIET:  We do recommend a small meal at first, but then you may proceed to your regular diet.  Drink plenty of fluids but you should avoid alcoholic beverages for 24 hours.  MEDICATIONS: Continue present medications.  Please see handouts given to you by your recovery nurse.  ACTIVITY:  You should plan to take it easy for the rest of today and you should NOT  DRIVE or use heavy machinery until tomorrow (because of the sedation medicines used during the test).    FOLLOW UP: Our staff will call the number listed on your records the next business day following your procedure to check on you and address any questions or concerns that you may have regarding the information given to you following your procedure. If we do not reach you, we will leave a message.  However, if you are feeling well and you are not experiencing any problems, there is no need to return our call.  We will assume that you have returned to your regular daily activities without incident.  If any biopsies were taken you will be contacted by phone or by letter within the next 1-3 weeks.  Please call us at [phone] if you have not heard about the biopsies in 3 weeks.   Thank you for allowing us to provide for your healthcare needs today.   SIGNATURES/CONFIDENTIALITY: You and/or your care partner have signed paperwork which will be entered into your electronic medical record.  These signatures attest to the fact that that the information above on your After Visit Summary has been reviewed and is understood.  Full responsibility of the confidentiality of this discharge information lies with you and/or your care-partner. 

## 2017-01-31 NOTE — Op Note (Signed)
[place] Patient Name: Dylan Hatfield Procedure Date: 01/31/2017 10:38 AM MRN: [id number] Endoscopist: Mauri Pole , MD Age: 51 Referring MD:  Date of Birth: October 31, 1965 Gender: Male Account #: [id number] Procedure:                Colonoscopy Indications:              Screening for colorectal malignant neoplasm, This                            is the patient's first colonoscopy Medicines:                Monitored Anesthesia Care Procedure:                Pre-Anesthesia Assessment:                           - Prior to the procedure, a History and Physical                            was performed, and patient medications and                            allergies were reviewed. The patient's tolerance of                            previous anesthesia was also reviewed. The risks                            and benefits of the procedure and the sedation                            options and risks were discussed with the patient.                            All questions were answered, and informed consent                            was obtained. Prior Anticoagulants: The patient has                            taken no previous anticoagulant or antiplatelet                            agents. ASA Grade Assessment: II - A patient with                            mild systemic disease. After reviewing the risks                            and benefits, the patient was deemed in                            satisfactory condition to undergo the procedure.  After obtaining informed consent, the colonoscope                            was passed under direct vision. Throughout the                            procedure, the patient's blood pressure, pulse, and                            oxygen saturations were monitored continuously. The                            Colonoscope was introduced through the anus and                            advanced to the the  cecum, identified by                            appendiceal orifice and ileocecal valve. The                            colonoscopy was performed without difficulty. The                            patient tolerated the procedure well. The quality                            of the bowel preparation was excellent. The                            ileocecal valve, appendiceal orifice, and rectum                            were photographed. Scope In: 11:01:31 AM Scope Out: 11:17:40 AM Scope Withdrawal Time: 0 hours 11 minutes 8 seconds  Total Procedure Duration: 0 hours 16 minutes 9 seconds  Findings:                 The perianal and digital rectal examinations were                            normal.                           A 5 mm polyp was found in the transverse colon. The                            polyp was sessile. The polyp was removed with a                            cold snare. Resection and retrieval were complete.                           Three sessile polyps were found in the rectum. The  polyps were 1 to 2 mm in size. These polyps were                            removed with a cold biopsy forceps. Resection and                            retrieval were complete.                           Multiple small-mouthed diverticula were found in                            the sigmoid colon and descending colon. Complications:            No immediate complications. Estimated Blood Loss:     Estimated blood loss was minimal. Impression:               - One 5 mm polyp in the transverse colon, removed                            with a cold snare. Resected and retrieved.                           - Three 1 to 2 mm polyps in the rectum, removed                            with a cold biopsy forceps. Resected and retrieved.                           - Diverticulosis in the sigmoid colon and in the                            descending colon. Recommendation:            - Patient has a contact number available for                            emergencies. The signs and symptoms of potential                            delayed complications were discussed with the                            patient. Return to normal activities tomorrow.                            Written discharge instructions were provided to the                            patient.                           - Resume previous diet.                           -  Continue present medications.                           - Await pathology results.                           - Repeat colonoscopy in 5-10 years for surveillance                            based on pathology results. Mauri Pole, MD 01/31/2017 11:22:14 AM This report has been signed electronically.

## 2017-01-31 NOTE — Progress Notes (Signed)
Pt. Reports no change in medical or surgical history since pre-visit 01/17/2017.

## 2017-02-01 ENCOUNTER — Telehealth: Payer: Self-pay | Admitting: *Deleted

## 2017-02-01 NOTE — Telephone Encounter (Signed)
  Follow up Call-  Call back number 01/31/2017  Post procedure Call Back phone  # [phone]  Permission to leave phone message Yes  Some recent data might be hidden     Patient questions:  Do you have a fever, pain , or abdominal swelling? No. Pain Score  0 *  Have you tolerated food without any problems? Yes.    Have you been able to return to your normal activities? Yes.    Do you have any questions about your discharge instructions: Diet   No. Medications  No. Follow up visit  No.  Do you have questions or concerns about your Care? No.  Actions: * If pain score is 4 or above: No action needed, pain <4.

## 2017-02-04 ENCOUNTER — Encounter: Payer: Self-pay | Admitting: Gastroenterology

## 2017-02-09 DIAGNOSIS — G4733 Obstructive sleep apnea (adult) (pediatric): Secondary | ICD-10-CM | POA: Diagnosis not present

## 2017-02-22 DIAGNOSIS — Z79891 Long term (current) use of opiate analgesic: Secondary | ICD-10-CM | POA: Diagnosis not present

## 2017-02-22 DIAGNOSIS — G894 Chronic pain syndrome: Secondary | ICD-10-CM | POA: Diagnosis not present

## 2017-02-22 DIAGNOSIS — M79606 Pain in leg, unspecified: Secondary | ICD-10-CM | POA: Diagnosis not present

## 2017-02-22 DIAGNOSIS — Z79899 Other long term (current) drug therapy: Secondary | ICD-10-CM | POA: Diagnosis not present

## 2017-02-22 DIAGNOSIS — M5136 Other intervertebral disc degeneration, lumbar region: Secondary | ICD-10-CM | POA: Diagnosis not present

## 2017-02-23 ENCOUNTER — Encounter: Payer: Self-pay | Admitting: Cardiology

## 2017-03-12 DIAGNOSIS — G4733 Obstructive sleep apnea (adult) (pediatric): Secondary | ICD-10-CM | POA: Diagnosis not present

## 2017-03-15 ENCOUNTER — Encounter: Payer: Self-pay | Admitting: Cardiology

## 2017-03-15 ENCOUNTER — Encounter (INDEPENDENT_AMBULATORY_CARE_PROVIDER_SITE_OTHER): Payer: Self-pay

## 2017-03-15 ENCOUNTER — Ambulatory Visit (INDEPENDENT_AMBULATORY_CARE_PROVIDER_SITE_OTHER): Payer: 59 | Admitting: Cardiology

## 2017-03-15 VITALS — BP 126/80 | HR 70 | Ht 76.0 in | Wt 271.4 lb

## 2017-03-15 DIAGNOSIS — R002 Palpitations: Secondary | ICD-10-CM | POA: Diagnosis not present

## 2017-03-15 DIAGNOSIS — G4733 Obstructive sleep apnea (adult) (pediatric): Secondary | ICD-10-CM | POA: Diagnosis not present

## 2017-03-15 NOTE — Progress Notes (Signed)
Electrophysiology Office Note   Date:  03/15/2017   ID:  Dylan Hatfield, DOB 02-24-1966, MRN [id number]  PCP:  Colon Branch, MD Primary Electrophysiologist:  Maximo Spratling [name], MD    Chief Complaint  Patient presents with  . Follow-up    Palpitations     History of Present Illness: Dylan Hatfield is a 51 y.o. male who presents today for electrophysiology evaluation.   He presents today with complaints of palpitations. He says that since last being seen, his palpitations have improved. He has had 2 episodes since last being seen that were short lived and quite different from his prior episodes. We had discussed previously getting the alive core monitor, but he feels that it is a little too expensive at this time.   Today, denies symptoms of palpitations, chest pain, shortness of breath, orthopnea, PND, lower extremity edema, claudication, dizziness, presyncope, syncope, bleeding, or neurologic sequela. The patient is tolerating medications without difficulties and is otherwise without complaint today.     Past Medical History:  Diagnosis Date  . Arthritis   . Chronic back pain    bilateral L5/S1 transforaminal lumbar epidural steroid injections, Dr. [name]  . DDD (degenerative disc disease), lumbosacral   . Lumbosacral spondylosis   . Sleep apnea    on CPAP, dx ~ 2008    Past Surgical History:  Procedure Laterality Date  . VASECTOMY       Current Outpatient Prescriptions  Medication Sig Dispense Refill  . baclofen (LIORESAL) 10 MG tablet Take 10 mg by mouth as needed for muscle spasms.    [name] HYDROcodone-acetaminophen (NORCO) 10-325 MG per tablet Take 1 tablet by mouth as needed.    [name] ibuprofen (ADVIL) 200 MG tablet Take 200 mg by mouth every 6 (six) hours as needed.    [name] OVER THE COUNTER MEDICATION ZZZQuil 1 oz before bedtime as needed.     Current Facility-Administered Medications  Medication Dose Route Frequency Provider Last Rate Last Dose  . 0.9 %  sodium chloride  infusion  500 mL Intravenous Continuous Nandigam, [name], MD        Allergies:   Patient has no known allergies.   Social History:  The patient  reports that he has quit smoking. He has quit using smokeless tobacco. He reports that he does not drink alcohol or use drugs.   Family History:  The patient's family history includes Arrhythmia in his maternal aunt; Breast cancer in his other; Stroke in his other.    ROS:  Please see the history of present illness.   Otherwise, review of systems is positive for none.   All other systems are reviewed and negative.     PHYSICAL EXAM: VS:  BP 126/80   Pulse 70   Ht 6\' 4"  (1.93 m)   Wt 271 lb 6.4 oz (123.1 kg)   BMI 33.04 kg/m  , BMI Body mass index is 33.04 kg/m. GEN: Well nourished, well developed, in no acute distress  HEENT: normal  Neck: no JVD, carotid bruits, or masses Cardiac: RRR; no murmurs, rubs, or gallops,no edema  Respiratory:  clear to auscultation bilaterally, normal work of breathing GI: soft, nontender, nondistended, + BS MS: no deformity or atrophy  Skin: warm and dry Neuro:  Strength and sensation are intact Psych: euthymic mood, full affect  EKG:  EKG is ordered today. Personal review of the ekg ordered 11/25/16 shows sinus rhythm, PACs, RBBB, LAFB  Recent Labs: 11/25/2016: ALT 33; BUN 14; Creatinine,  Ser 1.01; Hemoglobin 16.0; Platelets 257.0; Potassium 4.4; Sodium 138; TSH 2.79    Lipid Panel     Component Value Date/Time   CHOL 195 11/25/2016 0915   TRIG 271.0 (H) 11/25/2016 0915   HDL 31.40 (L) 11/25/2016 0915   CHOLHDL 6 11/25/2016 0915   VLDL 54.2 (H) 11/25/2016 0915   LDLCALC 164 (H) 06/17/2015 1636   LDLDIRECT 104.0 11/25/2016 0915     Wt Readings from Last 3 Encounters:  03/15/17 271 lb 6.4 oz (123.1 kg)  01/31/17 272 lb (123.4 kg)  01/17/17 272 lb 3.2 oz (123.5 kg)      Other studies Reviewed: Additional studies/ records that were reviewed today include: TTE 12/10/16  Review of the above  records today demonstrates:  - Left ventricle: The cavity size was moderately dilated. There was   mild concentric hypertrophy. Systolic function was normal. The   estimated ejection fraction was in the range of 60% to 65%. Wall   motion was normal; there were no regional wall motion   abnormalities. Left ventricular diastolic function parameters   were normal. - Aorta: Aortic root dimension: 38 mm (ED). - Aortic root: The aortic root was mildly dilated. - Left atrium: The atrium was mildly dilated. - Pulmonary arteries: Systolic pressure could not be accurately   estimated.   ASSESSMENT AND PLAN:  1.  Palpitations: Currently no firm diagnosis as to the cause of his palpitations. He does have APCs on his EKG that could be causing his symptoms. That being said, his symptoms are much improved since last being seen. Would not plan for monitoring at this time, as he is not having symptoms very often and they are not very severe. I have told him to call us back if his symptoms worsen.    2. Obstructive sleep apnea: Encouraged CPAP use   Current medicines are reviewed at length with the patient today.   The patient does not have concerns regarding his medicines.  The following changes were made today:    Labs/ tests ordered today include:  No orders of the defined types were placed in this encounter.    Disposition:   FU with Seung Nidiffer 12 months  Signed, Venson Ferencz [name], MD  03/15/2017 3:51 PM     [facility] [address] [place] [address] [phone] (office) [phone] (fax)

## 2017-03-15 NOTE — Patient Instructions (Signed)
Medication Instructions:  Your physician recommends that you continue on your current medications as directed. Please refer to the Current Medication list given to you today.  * If you need a refill on your cardiac medications before your next appointment, please call your pharmacy.   Labwork: None ordered  Testing/Procedures: None ordered  Follow-Up: Your physician wants you to follow-up in: 1 year with Dr. Camnitz.  You will receive a reminder letter in the mail two months in advance. If you don't receive a letter, please call our office to schedule the follow-up appointment.  Thank you for choosing [facility]!!   Janelys Glassner, RN [phone]        

## 2017-04-11 DIAGNOSIS — G4733 Obstructive sleep apnea (adult) (pediatric): Secondary | ICD-10-CM | POA: Diagnosis not present

## 2017-04-18 DIAGNOSIS — Z79891 Long term (current) use of opiate analgesic: Secondary | ICD-10-CM | POA: Diagnosis not present

## 2017-04-18 DIAGNOSIS — M5136 Other intervertebral disc degeneration, lumbar region: Secondary | ICD-10-CM | POA: Diagnosis not present

## 2017-04-18 DIAGNOSIS — M79606 Pain in leg, unspecified: Secondary | ICD-10-CM | POA: Diagnosis not present

## 2017-04-18 DIAGNOSIS — G894 Chronic pain syndrome: Secondary | ICD-10-CM | POA: Diagnosis not present

## 2017-04-18 DIAGNOSIS — Z79899 Other long term (current) drug therapy: Secondary | ICD-10-CM | POA: Diagnosis not present

## 2017-05-10 ENCOUNTER — Encounter: Payer: Self-pay | Admitting: Neurology

## 2017-05-12 ENCOUNTER — Encounter: Payer: Self-pay | Admitting: Neurology

## 2017-05-12 ENCOUNTER — Ambulatory Visit (INDEPENDENT_AMBULATORY_CARE_PROVIDER_SITE_OTHER): Payer: 59 | Admitting: Neurology

## 2017-05-12 VITALS — BP 116/77 | HR 60 | Ht 76.0 in | Wt 260.0 lb

## 2017-05-12 DIAGNOSIS — Z9989 Dependence on other enabling machines and devices: Secondary | ICD-10-CM | POA: Diagnosis not present

## 2017-05-12 DIAGNOSIS — G4733 Obstructive sleep apnea (adult) (pediatric): Secondary | ICD-10-CM | POA: Diagnosis not present

## 2017-05-12 NOTE — Progress Notes (Signed)
SLEEP MEDICINE CLINIC   Provider:  Larey Seat, M D  Referring Provider: Colon Branch, MD Primary Care Physician:  Colon Branch, MD  Chief Complaint  Patient presents with  . Follow-up    intial follow up with CPAP, pt uses [facility]. pt is needing to get a different mask but is planning to contact [facility] to get that.    HPI:  Dylan Hatfield is a 51 y.o. male , seen here as a referral from Dr. Larose Hatfield for a sleep consultation,  Interval history from 05/12/2017, Mr. Dylan Hatfield underwent a home sleep test on 01/22/2017 it showed only mild sleep apnea but strictly obstructive in nature. The patient has been used to CPAP therapy and I ordered and auto- titration. The patient did not have prolonged hypoxemia so inferiorly he could also be a good 10 dental device candidate should he choose not to use CPAP in the future. The data downloads available show 30 out of 30 days of 100% of use CPAP for 7 hours and 35 minutes on average. The machine has a pressure range between 4 and 15 cm water with 3 cm EPR and his residual AHI is only 0.5/hr. The 95% pressure is 8.7cm. He is loosing weight,  He lost 25 pounds.     Mr. Dylan Hatfield has been using CPAP for a dozen years, and continues to be compliant. He does need new supplies and probably a new machine. He has a history of arthritis, chronic back pain, degenerative disc disease in the lumbosacral region, spondylosis and sleep apnea. 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 His only regular taken medication is ibuprofen. Sleep habits are as follows: He has no trouble initiating sleep in his usual bedtime is around 9 PM. He prefers to sleep on his side, uses 3 pillows. His bedroom is dark, cool and quiet. His wife is sharing the bedroom and has not noted any sleep behaviors. He has been using CPAP compliantly, he does not snore while using CPAP. He has usually 2-3 bathroom breaks each night, but he is able to fall back asleep promptly. He rises  in the morning at 5 AM, after an estimate of 7 hours of nocturnal sleep. He has developed morning headaches in the center of his forehead, he wakes now up with a dry mouth which wasn't the case a year ago. This may be well related to the failure to humidify the CPAP and an ill fitting facial interface. He has always used a fullface mask. He was last given a fresh mask about a year ago. [facility].   Sleep medical history and family sleep history:  He strongly believes his father has obstructive sleep apnea but he has never been diagnosed or treated. He does not think his brother has apnea. His children also has not developed apnea. No family history of insomnia or restless legs.He was a sleep walker in childhood, no history of night terrors and no enuresis.  Social history:  Married , 2 children, non smoker- quit in his youth, non ETOH drinker , since 11-2009, caffeine - 2 cups in AM , no soda, rarely iced tea.   Review of Systems: Out of a complete 14 system review, the patient complains of only the following symptoms, and all other reviewed systems are negative. palpitation, some at night. Cardio work up , arranged for cardiac monitor.   Epworth score still 5, Fatigue severity score 18 from 29  , depression score n/a  Social History   Social History  . Marital status: Married    Spouse name: N/A  . Number of children: 2  . Years of education: N/A   Occupational History  . Occupation-- Public librarian [employer]   Social History Main Topics  . Smoking status: Former Research scientist (life sciences)  . Smokeless tobacco: Former Systems developer     Comment: 1 ppd, quit 2003  . Alcohol use No  . Drug use: No  . Sexual activity: Yes   Other Topics Concern  . Not on file   Social History Narrative   Daughter in [place]    Son in [place]    Family History  Problem Relation Age of Onset  . Stroke Other        GM stroke 17  . Breast cancer Other        GM  . Arrhythmia Maternal Aunt   . Diabetes Neg Hx    . CAD Neg Hx   . Colon cancer Neg Hx   . Prostate cancer Neg Hx   . Esophageal cancer Neg Hx   . Rectal cancer Neg Hx   . Stomach cancer Neg Hx     Past Medical History:  Diagnosis Date  . Arthritis   . Chronic back pain    bilateral L5/S1 transforaminal lumbar epidural steroid injections, Dr. [name]  . DDD (degenerative disc disease), lumbosacral   . Lumbosacral spondylosis   . Sleep apnea    on CPAP, dx ~ 2008     Past Surgical History:  Procedure Laterality Date  . VASECTOMY      Current Outpatient Prescriptions  Medication Sig Dispense Refill  . baclofen (LIORESAL) 10 MG tablet Take 10 mg by mouth as needed for muscle spasms.    [name] HYDROcodone-acetaminophen (NORCO) 10-325 MG per tablet Take 1 tablet by mouth as needed.    [name] ibuprofen (ADVIL) 200 MG tablet Take 200 mg by mouth every 6 (six) hours as needed.    [name] OVER THE COUNTER MEDICATION ZZZQuil 1 oz before bedtime as needed.     Current Facility-Administered Medications  Medication Dose Route Frequency Provider Last Rate Last Dose  . 0.9 %  sodium chloride infusion  500 mL Intravenous Continuous Mauri Pole, MD        Allergies as of 05/12/2017  . (No Known Allergies)    Vitals: BP 116/77   Pulse 60   Ht 6\' 4"  (1.93 m)   Wt 260 lb (117.9 kg)   BMI 31.65 kg/m  Last Weight:  Wt Readings from Last 1 Encounters:  05/12/17 260 lb (117.9 kg)   ZJQ:BHAL mass index is 31.65 kg/m.     Last Height:   Ht Readings from Last 1 Encounters:  05/12/17 6\' 4"  (1.93 m)    Physical exam:  General: The patient is awake, alert and appears not in acute distress. The patient is well groomed. Head: Normocephalic, atraumatic. Neck is supple. Mallampati 5,  neck circumference 18.25 . Nasal airflow patent, Retrognathia is not seen, but a crowded dental status. .  Cardiovascular:  Regular rate and rhythm , without  murmurs or carotid bruit, and without distended neck veins. Respiratory: Lungs are clear to  auscultation.Skin:  Without evidence of edema, or rashTrunk: BMI is 33. The patient's posture is erect    Neurologic exam Mood and affect are appropriate.  Cranial nerves:Pupils are equal and briskly reactive to light. Extraocular movements  in vertical and horizontal planes intact and without nystagmus.  Visual fields by finger perimetry are intact. Hearing to finger rub intact. Facial sensation intact to fine touch.Facial motor strength is symmetric and tongue and uvula move midline. Shoulder shrug was symmetrical.  Motor exam:  Normal tone, muscle bulk and symmetric strength in all extremities.  Deep tendon reflexes: in the  upper and lower extremities are symmetric and intact. Babinski maneuver response is downgoing.  The patient has back pain, is in physical therapy.  The patient was advised of the nature of the diagnosed OSA sleep disorder , the treatment options  ( dental device and CPAP, BiPAP  as well as behavior modification, weigh loss ) and risks for general a health and wellness arising from not treating the condition. I spent more than 20 minutes of face to face time with the patient. Greater than 50% of time was spent in counseling and coordination of care. We have discussed the diagnosis and differential and I answered the patient's questions.     Assessment:  After physical and neurologic examination, review of laboratory studies,  Personal review of imaging studies, reports of other /same  Imaging studies ,  Results of polysomnography/ neurophysiology testing and pre-existing records as far as provided in visit., my assessment is   1)  OSA -  the home sleep test confirmed the presence of obstructive sleep apnea but at a mild degree without associated oxygen desaturation, prolonged hypoxemia, there was a range of heart rate but indicated tachybradycardia with variability between 47 and 116 bpm. He is doing excellent on his current AutoSet and no further changes have to be made. He will  contact [facility] to see if he can change to a different mask. He prefers a full face mask model , looks for a larger size-Airtouch.   Asencion Partridge Rayelle Armor MD  05/12/2017   CC: Colon Branch, [place], [place] [address]

## 2017-05-12 NOTE — Patient Instructions (Signed)

## 2017-05-23 ENCOUNTER — Encounter (INDEPENDENT_AMBULATORY_CARE_PROVIDER_SITE_OTHER): Admitting: Family Medicine

## 2017-06-08 ENCOUNTER — Ambulatory Visit
Admission: RE | Admit: 2017-06-08 | Discharge: 2017-06-08 | Disposition: A | Discharge status: Home / Self Care | Payer: 59 | Source: Ambulatory Visit | Attending: Physician Assistant | Admitting: Physician Assistant

## 2017-06-08 ENCOUNTER — Other Ambulatory Visit: Payer: Self-pay | Admitting: Physician Assistant

## 2017-06-08 DIAGNOSIS — M545 Low back pain, unspecified: Secondary | ICD-10-CM

## 2017-06-08 DIAGNOSIS — M48061 Spinal stenosis, lumbar region without neurogenic claudication: Secondary | ICD-10-CM | POA: Diagnosis not present

## 2017-06-10 DIAGNOSIS — G4733 Obstructive sleep apnea (adult) (pediatric): Secondary | ICD-10-CM | POA: Diagnosis not present

## 2017-06-12 DIAGNOSIS — G4733 Obstructive sleep apnea (adult) (pediatric): Secondary | ICD-10-CM | POA: Diagnosis not present

## 2017-06-16 DIAGNOSIS — Z79891 Long term (current) use of opiate analgesic: Secondary | ICD-10-CM | POA: Diagnosis not present

## 2017-06-16 DIAGNOSIS — G894 Chronic pain syndrome: Secondary | ICD-10-CM | POA: Diagnosis not present

## 2017-06-16 DIAGNOSIS — Z79899 Other long term (current) drug therapy: Secondary | ICD-10-CM | POA: Diagnosis not present

## 2017-06-16 DIAGNOSIS — M47816 Spondylosis without myelopathy or radiculopathy, lumbar region: Secondary | ICD-10-CM | POA: Diagnosis not present

## 2017-07-04 ENCOUNTER — Encounter (INDEPENDENT_AMBULATORY_CARE_PROVIDER_SITE_OTHER): Payer: Self-pay | Admitting: Family Medicine

## 2017-07-04 ENCOUNTER — Ambulatory Visit (INDEPENDENT_AMBULATORY_CARE_PROVIDER_SITE_OTHER): Admitting: Family Medicine

## 2017-07-04 VITALS — BP 128/82 | HR 88 | Temp 97.3°F | Resp 16 | Ht 71.35 in | Wt 209.6 lb

## 2017-07-04 DIAGNOSIS — Z0000 Encounter for general adult medical examination without abnormal findings: Secondary | ICD-10-CM

## 2017-07-04 DIAGNOSIS — Z1211 Encounter for screening for malignant neoplasm of colon: Principal | ICD-10-CM

## 2017-07-04 MED ORDER — CLONAZEPAM 0.5 MG OR TABS
0.50 mg | ORAL_TABLET | Freq: Every evening | ORAL | 0 refills | Status: DC
Start: 2017-06-04 — End: 2023-03-21

## 2017-07-04 MED ORDER — MIRTAZAPINE 15 MG OR TABS
15.00 mg | ORAL_TABLET | Freq: Every evening | ORAL | 0 refills | Status: AC
Start: 2017-07-02 — End: ?

## 2017-07-04 NOTE — Progress Notes (Signed)
Encounter Date:  07/04/17  5:06 PM   PCP: Henry Norman  MRN: [id number]      HPI:    Henry Norman is a 50 year old y/o male who presents today for annual CPE.     Patient has been doing well overall.  Pt does not exercise regularly, but does walk often.   Denies history of ever having a colonoscopy.     Reports tearing his R rotator cuff x1 year ago.  States he notices a constant popping after playing catch with his son.   Pt is scheduled with ortho for consultation.     PROBLEM LIST:  Patient Active Problem List   Diagnosis   . Encounter for routine adult health examination without abnormal findings   . Cervical radiculitis       PAST MEDICAL HISTORY:  No past medical history on file.    PAST SURGICAL HISTORY:  Past Surgical History:   Procedure Laterality Date   . KNEE SURGERY Left 09/27/1986    Torn ACL   . KNEE SURGERY Left 09/27/2004    Meniscus Tear   . SHOULDER SURGERY Left     labrum tear        FAMILY HISTORY:   Family History   Problem Relation Age of Onset   . Family history unknown: Yes       SOCIAL HISTORY:  Social History     Social History   . Marital status: Married     Spouse name: N/A   . Number of children: N/A   . Years of education: N/A     Occupational History   . Not on file.     Social History Main Topics   . Smoking status: Former Smoker     Quit date: 2006   . Smokeless tobacco: Former Neurosurgeon   . Alcohol use No   . Drug use: Not on file   . Sexual activity: Not on file     Other Topics Concern   . Not on file     Social History Narrative   . No narrative on file     History   Smoking Status   . Former Smoker   . Quit date: 2006   Smokeless Tobacco   . Former User      History   Alcohol Use No      History   Drug Use Not on file        CURRENT  MEDICATIONS:  No outpatient prescriptions have been marked as taking for the 07/04/17 encounter (Office Visit) with Henry Pacer, MD.        ALLERGIES:    Allergies   Allergen Reactions   . Fluoxetine Palpitations   . Paroxetine Palpitations             REVIEW OF SYSTEMS:  Review of Systems   Constitutional: Negative for activity change and appetite change.   HENT: Negative for dental problem and hearing loss.    Eyes: Negative for visual disturbance.   Respiratory: Negative for shortness of breath.    Cardiovascular: Negative for chest pain and palpitations.   Gastrointestinal: Negative for abdominal pain, constipation, diarrhea, nausea and vomiting.   Genitourinary: Negative for difficulty urinating and urgency.   Skin: Negative.    Neurological: Negative for headaches.   Psychiatric/Behavioral: Negative for sleep disturbance.       PHYSICAL EXAM:   07/04/17  1657   BP: 128/82   Pulse: 88   Resp: 16  Temp: 97.3 F (36.3 C)     Body mass index is 28.95 kg/(m^2).    Physical Exam   Constitutional: He is oriented to person, place, and time. He appears well-developed and well-nourished.   HENT:   Head: Normocephalic.   Right Ear: Hearing, tympanic membrane and external ear normal.   Left Ear: Hearing, tympanic membrane and external ear normal.   Nose: Nose normal.   Mouth/Throat: Oropharynx is clear and moist and mucous membranes are normal.   Eyes: Pupils are equal, round, and reactive to light. Conjunctivae and EOM are normal.   Neck: Normal range of motion. No thyromegaly present.   Cardiovascular: Normal rate, regular rhythm, S1 normal, S2 normal, normal heart sounds and normal pulses.    No murmur heard.  Pulmonary/Chest: Effort normal and breath sounds normal. He has no wheezes.   Abdominal: Soft. Bowel sounds are normal. He exhibits no distension. There is no tenderness. There is no rebound and no guarding. No hernia.   Genitourinary: Prostate normal.   Musculoskeletal: Normal range of motion.   Lymphadenopathy:     He has no cervical adenopathy.   Neurological: He is alert and oriented to person, place, and time. He has normal strength and normal reflexes. No cranial nerve deficit or sensory deficit.   Skin: Skin is warm and dry.   Psychiatric: He has a  normal mood and affect. His behavior is normal. Judgment and thought content normal.       ASSESSMENT & PLAN:    ICD-10-CM ICD-9-CM    1. Screening for colon cancer Z12.11 V76.51 Gastroenterology/Colonoscopy      CANCELED: Gastroenterology/Colonoscopy   2. Encounter for routine adult health examination without abnormal findings Z00.00 V70.0 CBC w/ Diff Lavender      Comprehensive Metabolic Panel - See Instructions      Lipid Panel Green Plasma Separator Tube      TSH, Blood - See Instructions     [name] was seen today for physical.    Diagnoses and associated orders for this visit:    Screening for colon cancer  -     Gastroenterology/Colonoscopy    Encounter for routine adult health examination without abnormal findings  Assessment & Plan:  Labs ordered.  Vaccine Recommendations discussed and reviewed.  Diet and exercise discussed in detail with patient.  Routine dental and vision exams advised.  Quality Management Section reviewed and updated.  Family, Surgical History and Social History reviewed.  Screening exams discussed and advised if indicated.    Orders:  -     CBC w/ Diff Lavender  -     Comprehensive Metabolic Panel - See Instructions  -     Lipid Panel Green Plasma Separator Tube  -     TSH, Blood - See Instructions          By signing below, I acknowledge that I have reviewed the above note, dictated by me and scribed by Henry Norman, for accuracy and edited where necessary. The note accurately reflects the services provided at this  encounter. We retain the right to modify this information in the event of errors. Occasional errors in punctuation, grammar and content may occur.        Electronically reviewed and signed by Henry Norman, M.D.      [facility]  [email or web address]

## 2017-07-04 NOTE — Assessment & Plan Note (Signed)
Labs ordered.  Vaccine Recommendations discussed and reviewed.  Diet and exercise discussed in detail with patient.  Routine dental and vision exams advised.  Quality Management Section reviewed and updated.  Family, Surgical History and Social History reviewed.  Screening exams discussed and advised if indicated.

## 2017-07-06 DIAGNOSIS — M47817 Spondylosis without myelopathy or radiculopathy, lumbosacral region: Secondary | ICD-10-CM | POA: Diagnosis not present

## 2017-07-11 LAB — COMPREHENSIVE METABOLIC PANEL, BLOOD
ALT (SGPT): 17 U/L (ref 9–46)
AST (SGOT): 18 U/L (ref 10–35)
Albumin/Glob Ratio: 1.6 (calc) (ref 1.0–2.5)
Albumin: 4.5 g/dL (ref 3.6–5.1)
Alkaline Phos: 56 U/L (ref 40–115)
BUN: 12 mg/dL (ref 7–25)
Bilirubin, Total: 0.7 mg/dL (ref 0.2–1.2)
Calcium: 9.7 mg/dL (ref 8.6–10.3)
Carbon Dioxide: 28 mmol/L (ref 20–32)
Chloride: 103 mmol/L (ref 98–110)
Creatinine: 0.94 mg/dL (ref 0.70–1.33)
Globulin: 2.8 g/dL (calc) (ref 1.9–3.7)
Glucose: 93 mg/dL (ref 65–99)
Potassium: 5.4 mmol/L — ABNORMAL HIGH (ref 3.5–5.3)
Sodium: 139 mmol/L (ref 135–146)
Total Protein: 7.3 g/dL (ref 6.1–8.1)
eGFR African American: 108 mL/min/{1.73_m2} (ref >=60)
eGFR non-Afr.American: 93 mL/min/{1.73_m2} (ref >=60)

## 2017-07-11 LAB — CBC WITH DIFF, BLOOD
Abs Basophils: 48 cells/uL (ref 0–200)
Abs Eosinophils: 111 cells/uL (ref 15–500)
Abs Lymphs: 1579 cells/uL (ref 850–3900)
Abs Monocytes: 355 cells/uL (ref 200–950)
Abs Neutrophils: 3207 cells/uL (ref 1500–7800)
Basophils: 0.9 %
Eosinophils: 2.1 %
HCT: 50.4 % — ABNORMAL HIGH (ref 38.5–50.0)
HGB: 17.5 g/dL — ABNORMAL HIGH (ref 13.2–17.1)
Lymps: 29.8 %
MCH: 30.8 pg (ref 27.0–33.0)
MCHC: 34.7 g/dL (ref 32.0–36.0)
MCV: 88.7 fL (ref 80.0–100.0)
MPV: 9.3 fL (ref 7.5–12.5)
Monocytes: 6.7 %
PLT: 218 10*3/uL (ref 140–400)
RBC: 5.68 10*6/uL (ref 4.20–5.80)
RDW: 12.7 % (ref 11.0–15.0)
SEGS: 60.5 %
WBC: 5.3 10*3/uL (ref 3.8–10.8)

## 2017-07-11 LAB — LDL-CHOLESTEROL (CALC)-QUEST: LDL-Cholesterol: 107 mg/dL (calc) — ABNORMAL HIGH

## 2017-07-11 LAB — CHOLESTEROL, TOTAL BLOOD: Cholesterol: 179 mg/dL (ref <200)

## 2017-07-11 LAB — TSH, BLOOD: TSH: 1.26 mIU/L (ref 0.40–4.50)

## 2017-07-11 LAB — NON HDL CHOLESTEROL -QUEST: Non-HDL Cholesterol: 128 mg/dL (calc) (ref <130)

## 2017-07-11 LAB — CHOLESTEROL/HDLC RATIO-QUEST: Chol/HDLC Ratio: 3.5 (calc) (ref <5.0)

## 2017-07-11 LAB — HDL-CHOLESTEROL, BLOOD: HDL Cholesterol: 51 mg/dL (ref >40)

## 2017-07-11 LAB — TRIGLYCERIDES, BLOOD: Triglycerides: 117 mg/dL (ref <150)

## 2017-07-11 NOTE — Progress Notes (Signed)
 Notify patient labs are stable.

## 2017-07-12 DIAGNOSIS — G4733 Obstructive sleep apnea (adult) (pediatric): Secondary | ICD-10-CM | POA: Diagnosis not present

## 2017-08-03 ENCOUNTER — Other Ambulatory Visit: Payer: Self-pay | Admitting: Pain Medicine

## 2017-08-03 ENCOUNTER — Ambulatory Visit
Admission: RE | Admit: 2017-08-03 | Discharge: 2017-08-03 | Disposition: A | Discharge status: Home / Self Care | Payer: 59 | Source: Ambulatory Visit | Attending: Pain Medicine | Admitting: Pain Medicine

## 2017-08-03 DIAGNOSIS — M25561 Pain in right knee: Principal | ICD-10-CM

## 2017-08-03 DIAGNOSIS — G8929 Other chronic pain: Secondary | ICD-10-CM

## 2017-08-03 DIAGNOSIS — M25511 Pain in right shoulder: Secondary | ICD-10-CM

## 2017-08-03 DIAGNOSIS — M19011 Primary osteoarthritis, right shoulder: Secondary | ICD-10-CM | POA: Diagnosis not present

## 2017-08-04 DIAGNOSIS — M19019 Primary osteoarthritis, unspecified shoulder: Secondary | ICD-10-CM | POA: Diagnosis not present

## 2017-08-04 DIAGNOSIS — G4733 Obstructive sleep apnea (adult) (pediatric): Secondary | ICD-10-CM | POA: Diagnosis not present

## 2017-08-08 DIAGNOSIS — G894 Chronic pain syndrome: Secondary | ICD-10-CM | POA: Diagnosis not present

## 2017-08-08 DIAGNOSIS — M47817 Spondylosis without myelopathy or radiculopathy, lumbosacral region: Secondary | ICD-10-CM | POA: Diagnosis not present

## 2017-08-08 DIAGNOSIS — Z79891 Long term (current) use of opiate analgesic: Secondary | ICD-10-CM | POA: Diagnosis not present

## 2017-08-08 DIAGNOSIS — M5136 Other intervertebral disc degeneration, lumbar region: Secondary | ICD-10-CM | POA: Diagnosis not present

## 2017-08-08 DIAGNOSIS — M25569 Pain in unspecified knee: Secondary | ICD-10-CM | POA: Diagnosis not present

## 2017-08-08 DIAGNOSIS — Z79899 Other long term (current) drug therapy: Secondary | ICD-10-CM | POA: Diagnosis not present

## 2017-08-12 DIAGNOSIS — G4733 Obstructive sleep apnea (adult) (pediatric): Secondary | ICD-10-CM | POA: Diagnosis not present

## 2017-08-29 ENCOUNTER — Ambulatory Visit (INDEPENDENT_AMBULATORY_CARE_PROVIDER_SITE_OTHER): Admitting: Family Medicine

## 2017-08-29 ENCOUNTER — Encounter (INDEPENDENT_AMBULATORY_CARE_PROVIDER_SITE_OTHER): Payer: Self-pay | Admitting: Family Medicine

## 2017-08-29 VITALS — BP 128/68 | HR 88 | Temp 97.8°F | Resp 16 | Ht 71.0 in | Wt 213.0 lb

## 2017-08-29 DIAGNOSIS — Z01818 Encounter for other preprocedural examination: Secondary | ICD-10-CM

## 2017-08-29 DIAGNOSIS — M25519 Pain in unspecified shoulder: Principal | ICD-10-CM

## 2017-09-07 ENCOUNTER — Telehealth (INDEPENDENT_AMBULATORY_CARE_PROVIDER_SITE_OTHER): Payer: Self-pay | Admitting: Family Medicine

## 2017-09-07 NOTE — Telephone Encounter (Signed)
EKG  Faxed to DR LUNA

## 2017-09-07 NOTE — Telephone Encounter (Signed)
DR. Eddie Candle OFFICE CALLING IN AND IS REQUESTING EKG FOR PRE-OP BE FAXED OVER TO [phone] I DONT SEE IT IN THE CHART. PLEASE ADVISE     SURGERY IS Friday

## 2017-09-08 DIAGNOSIS — M25569 Pain in unspecified knee: Secondary | ICD-10-CM | POA: Diagnosis not present

## 2017-09-11 DIAGNOSIS — G4733 Obstructive sleep apnea (adult) (pediatric): Secondary | ICD-10-CM | POA: Diagnosis not present

## 2017-10-03 DIAGNOSIS — Z79891 Long term (current) use of opiate analgesic: Secondary | ICD-10-CM | POA: Diagnosis not present

## 2017-10-03 DIAGNOSIS — M47816 Spondylosis without myelopathy or radiculopathy, lumbar region: Secondary | ICD-10-CM | POA: Diagnosis not present

## 2017-10-03 DIAGNOSIS — M5136 Other intervertebral disc degeneration, lumbar region: Secondary | ICD-10-CM | POA: Diagnosis not present

## 2017-10-03 DIAGNOSIS — Z79899 Other long term (current) drug therapy: Secondary | ICD-10-CM | POA: Diagnosis not present

## 2017-10-03 DIAGNOSIS — G894 Chronic pain syndrome: Secondary | ICD-10-CM | POA: Diagnosis not present

## 2017-10-12 DIAGNOSIS — G4733 Obstructive sleep apnea (adult) (pediatric): Secondary | ICD-10-CM | POA: Diagnosis not present

## 2017-11-10 DIAGNOSIS — G4733 Obstructive sleep apnea (adult) (pediatric): Secondary | ICD-10-CM | POA: Diagnosis not present

## 2017-11-12 DIAGNOSIS — G4733 Obstructive sleep apnea (adult) (pediatric): Secondary | ICD-10-CM | POA: Diagnosis not present

## 2017-12-01 DIAGNOSIS — Z79899 Other long term (current) drug therapy: Secondary | ICD-10-CM | POA: Diagnosis not present

## 2017-12-01 DIAGNOSIS — G894 Chronic pain syndrome: Secondary | ICD-10-CM | POA: Diagnosis not present

## 2017-12-01 DIAGNOSIS — Z79891 Long term (current) use of opiate analgesic: Secondary | ICD-10-CM | POA: Diagnosis not present

## 2017-12-01 DIAGNOSIS — M25569 Pain in unspecified knee: Secondary | ICD-10-CM | POA: Diagnosis not present

## 2017-12-01 DIAGNOSIS — M5136 Other intervertebral disc degeneration, lumbar region: Secondary | ICD-10-CM | POA: Diagnosis not present

## 2017-12-12 ENCOUNTER — Encounter: Payer: Self-pay | Admitting: Internal Medicine

## 2017-12-12 ENCOUNTER — Ambulatory Visit (INDEPENDENT_AMBULATORY_CARE_PROVIDER_SITE_OTHER): Payer: 59 | Admitting: Internal Medicine

## 2017-12-12 VITALS — BP 128/80 | HR 59 | Temp 97.8°F | Resp 16 | Ht 76.0 in | Wt 251.0 lb

## 2017-12-12 DIAGNOSIS — Z0000 Encounter for general adult medical examination without abnormal findings: Secondary | ICD-10-CM

## 2017-12-12 LAB — COMPREHENSIVE METABOLIC PANEL
ALBUMIN: 4.7 g/dL (ref 3.5–5.2)
ALK PHOS: 41 U/L (ref 39–117)
ALT: 26 U/L (ref 0–53)
AST: 19 U/L (ref 0–37)
BUN: 26 mg/dL — AB (ref 6–23)
CALCIUM: 9.9 mg/dL (ref 8.4–10.5)
CHLORIDE: 103 meq/L (ref 96–112)
CO2: 27 mEq/L (ref 19–32)
Creatinine, Ser: 0.99 mg/dL (ref 0.40–1.50)
GFR: 84.21 mL/min (ref >60.00)
Glucose, Bld: 110 mg/dL — ABNORMAL HIGH (ref 70–99)
POTASSIUM: 4.3 meq/L (ref 3.5–5.1)
SODIUM: 139 meq/L (ref 135–145)
TOTAL PROTEIN: 6.9 g/dL (ref 6.0–8.3)
Total Bilirubin: 0.7 mg/dL (ref 0.2–1.2)

## 2017-12-12 LAB — CBC WITH DIFFERENTIAL/PLATELET
BASOS PCT: 0.4 % (ref 0.0–3.0)
Basophils Absolute: 0 10*3/uL (ref 0.0–0.1)
EOS PCT: 2.6 % (ref 0.0–5.0)
Eosinophils Absolute: 0.2 10*3/uL (ref 0.0–0.7)
HEMATOCRIT: 46.4 % (ref 39.0–52.0)
HEMOGLOBIN: 15.8 g/dL (ref 13.0–17.0)
LYMPHS PCT: 24.7 % (ref 12.0–46.0)
Lymphs Abs: 1.7 10*3/uL (ref 0.7–4.0)
MCHC: 34.1 g/dL (ref 30.0–36.0)
MCV: 86.7 fl (ref 78.0–100.0)
MONO ABS: 0.4 10*3/uL (ref 0.1–1.0)
MONOS PCT: 6 % (ref 3.0–12.0)
Neutro Abs: 4.5 10*3/uL (ref 1.4–7.7)
Neutrophils Relative %: 66.3 % (ref 43.0–77.0)
Platelets: 213 10*3/uL (ref 150.0–400.0)
RBC: 5.36 Mil/uL (ref 4.22–5.81)
RDW: 13.6 % (ref 11.5–15.5)
WBC: 6.8 10*3/uL (ref 4.0–10.5)

## 2017-12-12 LAB — LIPID PANEL
CHOLESTEROL: 174 mg/dL (ref 0–200)
HDL: 34.1 mg/dL — ABNORMAL LOW (ref >39.00)
LDL CALC: 113 mg/dL — AB (ref 0–99)
NonHDL: 139.48
Total CHOL/HDL Ratio: 5
Triglycerides: 134 mg/dL (ref 0.0–149.0)
VLDL: 26.8 mg/dL (ref 0.0–40.0)

## 2017-12-12 LAB — HEMOGLOBIN A1C: HEMOGLOBIN A1C: 5.4 % (ref 4.6–6.5)

## 2017-12-12 NOTE — Progress Notes (Signed)
Subjective:    Patient ID: Dylan Hatfield, male    DOB: 04/28/1966, 52 y.o.   MRN: [id number]  DOS:  12/12/2017 Type of visit - description : cpx Interval history: no major concerns, feels actually very well  Wt Readings from Last 3 Encounters:  12/12/17 251 lb (113.9 kg)  05/12/17 260 lb (117.9 kg)  03/15/17 271 lb 6.4 oz (123.1 kg)     Review of Systems No new concerns, aches and pains at baseline. Good compliance with CPAP Occasional palpitations and L UTS.  Better than baseline.  Other than above, a 14 point review of systems is negative    Past Medical History:  Diagnosis Date  . Arthritis   . Chronic back pain    bilateral L5/S1 transforaminal lumbar epidural steroid injections, Dr. [name]  . DDD (degenerative disc disease), lumbosacral   . Lumbosacral spondylosis   . Sleep apnea    on CPAP, dx ~ 2008     Past Surgical History:  Procedure Laterality Date  . VASECTOMY      Social History   Socioeconomic History  . Marital status: Married    Spouse name: Not on file  . Number of children: 2  . Years of education: Not on file  . Highest education level: Not on file  Social Needs  . Financial resource strain: Not on file  . Food insecurity - worry: Not on file  . Food insecurity - inability: Not on file  . Transportation needs - medical: Not on file  . Transportation needs - non-medical: Not on file  Occupational History  . 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[employer] Dylan Hatfield  Tobacco Use  . Smoking status: Former Research scientist (life sciences)  . Smokeless tobacco: Former Systems developer  . Tobacco comment: 1 ppd, quit 2003  Substance and Sexual Activity  . Alcohol use: No  . Drug use: No  . Sexual activity: Yes  Other Topics Concern  . Not on file  Social History Narrative   Daughter in [place]    Son in [place]     Family History  Problem Relation Age of Onset  . Stroke Other        GM stroke 36  . Breast cancer Other        GM  . Arrhythmia  Maternal Aunt   . Diabetes Neg Hx   . CAD Neg Hx   . Colon cancer Neg Hx   . Prostate cancer Neg Hx   . Esophageal cancer Neg Hx   . Rectal cancer Neg Hx   . Stomach cancer Neg Hx      Allergies as of 12/12/2017   No Known Allergies     Medication List        Accurate as of 12/12/17 10:44 AM. Always use your most recent med list.          ADVIL 200 MG tablet Generic drug:  ibuprofen Take 200 mg by mouth every 6 (six) hours as needed.   baclofen 10 MG tablet Commonly known as:  LIORESAL Take 10 mg by mouth as needed for muscle spasms.   HYDROcodone-acetaminophen 10-325 MG tablet Commonly known as:  NORCO Take 1 tablet by mouth as needed.   OVER THE COUNTER MEDICATION ZZZQuil 1 oz before bedtime as needed.          Objective:   Physical Exam BP 128/80 (BP Location: Right Arm, Patient Position: Sitting, Cuff Size: Large)   Pulse (!) 59  Temp 97.8 F (36.6 C) (Oral)   Resp 16   Ht 6\' 4"  (1.93 m)   Wt 251 lb (113.9 kg)   SpO2 99%   BMI 30.55 kg/m  General:   Well developed, well nourished . NAD.  Neck: No  thyromegaly  HEENT:  Normocephalic . Face symmetric, atraumatic Lungs:  CTA B Normal respiratory effort, no intercostal retractions, no accessory muscle use. Heart: RRR,  no murmur.  No pretibial edema bilaterally  Chest wall: Bony abnormality seen some unchange Abdomen:  Not distended, soft, non-tender. No rebound or rigidity.   Skin: Exposed areas without rash. Not pale. Not jaundice Neurologic:  alert & oriented X3.  Speech normal, gait appropriate for age and unassisted Strength symmetric and appropriate for age.  Psych: Cognition and judgment appear intact.  Cooperative with normal attention span and concentration.  Behavior appropriate. No anxious or depressed appearing.     Assessment & Plan:   Assessment  OSA   Dx 2008, on CPAP MSK: DJD Trigger fingers  Chronic back pain-- sees pain mngmt  LUTS (nocturia x years, flomax caused  retrograde ejaculation) Bony mass deformity of the chest wall, XR 06-2014 -- benign changes Lipoma: At the mid back, diameter 10 cm     PLAN      OSA: Good CPAP compliance MSK: Chronic  Pain (back, shoulder)  at baseline, sees pain management LUTS: Has nocturia episodically, better than baseline. Palpitations: Saw cardiology, further eval suggested, sxs are mild and getting better, patient not interested in further eval.  Denies CP/SOB. RTC 1 year

## 2017-12-12 NOTE — Patient Instructions (Signed)
GO TO THE LAB : Get the blood work     GO TO THE FRONT DESK Schedule your next appointment for a  Physical in 1 year  

## 2017-12-12 NOTE — Assessment & Plan Note (Addendum)
-  Td 2010  CCS: Colonoscopy 01/2017, + polyps, 5 years Prostate cancer screening:  PSA-DRE 2018 normal Labs: Check CMP, FLP, CBC, A1c Diet-exercise doing great, eating much healthier, he remains active, has lost more than 20 pounds.

## 2017-12-12 NOTE — Assessment & Plan Note (Signed)
OSA: Good CPAP compliance MSK: Chronic  Pain (back, shoulder)  at baseline, sees pain management LUTS: Has nocturia episodically, better than baseline. Palpitations: Saw cardiology, further eval suggested, sxs are mild and getting better, patient not interested in further eval.  Denies CP/SOB. RTC 1 year

## 2017-12-23 ENCOUNTER — Encounter (INDEPENDENT_AMBULATORY_CARE_PROVIDER_SITE_OTHER): Payer: Self-pay | Admitting: Family Medicine

## 2018-01-06 ENCOUNTER — Encounter (INDEPENDENT_AMBULATORY_CARE_PROVIDER_SITE_OTHER): Admitting: General Practice

## 2018-01-26 ENCOUNTER — Other Ambulatory Visit: Payer: Self-pay | Admitting: Pain Medicine

## 2018-01-26 DIAGNOSIS — Z77018 Contact with and (suspected) exposure to other hazardous metals: Secondary | ICD-10-CM

## 2018-01-26 DIAGNOSIS — Z79891 Long term (current) use of opiate analgesic: Secondary | ICD-10-CM | POA: Diagnosis not present

## 2018-01-26 DIAGNOSIS — M79606 Pain in leg, unspecified: Secondary | ICD-10-CM | POA: Diagnosis not present

## 2018-01-26 DIAGNOSIS — M47816 Spondylosis without myelopathy or radiculopathy, lumbar region: Secondary | ICD-10-CM | POA: Diagnosis not present

## 2018-01-26 DIAGNOSIS — G894 Chronic pain syndrome: Secondary | ICD-10-CM | POA: Diagnosis not present

## 2018-01-26 DIAGNOSIS — Z79899 Other long term (current) drug therapy: Secondary | ICD-10-CM | POA: Diagnosis not present

## 2018-01-26 DIAGNOSIS — M25511 Pain in right shoulder: Secondary | ICD-10-CM

## 2018-02-02 ENCOUNTER — Ambulatory Visit
Admission: RE | Admit: 2018-02-02 | Discharge: 2018-02-02 | Disposition: A | Discharge status: Home / Self Care | Payer: 59 | Source: Ambulatory Visit | Attending: Pain Medicine | Admitting: Pain Medicine

## 2018-02-02 DIAGNOSIS — M25511 Pain in right shoulder: Secondary | ICD-10-CM

## 2018-02-02 DIAGNOSIS — Z77018 Contact with and (suspected) exposure to other hazardous metals: Secondary | ICD-10-CM

## 2018-02-02 DIAGNOSIS — Z135 Encounter for screening for eye and ear disorders: Secondary | ICD-10-CM | POA: Diagnosis not present

## 2018-02-02 DIAGNOSIS — M7581 Other shoulder lesions, right shoulder: Secondary | ICD-10-CM | POA: Diagnosis not present

## 2018-02-23 DIAGNOSIS — Z79899 Other long term (current) drug therapy: Secondary | ICD-10-CM | POA: Diagnosis not present

## 2018-02-23 DIAGNOSIS — M5136 Other intervertebral disc degeneration, lumbar region: Secondary | ICD-10-CM | POA: Diagnosis not present

## 2018-02-23 DIAGNOSIS — M25569 Pain in unspecified knee: Secondary | ICD-10-CM | POA: Diagnosis not present

## 2018-03-28 ENCOUNTER — Ambulatory Visit: Payer: 59 | Admitting: Cardiology

## 2018-03-28 ENCOUNTER — Encounter: Payer: Self-pay | Admitting: Cardiology

## 2018-03-28 VITALS — BP 134/86 | HR 54 | Ht 75.0 in | Wt 249.2 lb

## 2018-03-28 DIAGNOSIS — G894 Chronic pain syndrome: Secondary | ICD-10-CM | POA: Diagnosis not present

## 2018-03-28 DIAGNOSIS — Z79891 Long term (current) use of opiate analgesic: Secondary | ICD-10-CM | POA: Diagnosis not present

## 2018-03-28 DIAGNOSIS — G4733 Obstructive sleep apnea (adult) (pediatric): Secondary | ICD-10-CM | POA: Diagnosis not present

## 2018-03-28 DIAGNOSIS — M47816 Spondylosis without myelopathy or radiculopathy, lumbar region: Secondary | ICD-10-CM | POA: Diagnosis not present

## 2018-03-28 DIAGNOSIS — R002 Palpitations: Secondary | ICD-10-CM | POA: Diagnosis not present

## 2018-03-28 DIAGNOSIS — M79606 Pain in leg, unspecified: Secondary | ICD-10-CM | POA: Diagnosis not present

## 2018-03-28 DIAGNOSIS — Z79899 Other long term (current) drug therapy: Secondary | ICD-10-CM | POA: Diagnosis not present

## 2018-03-28 NOTE — Patient Instructions (Signed)
Medication Instructions:  Your physician recommends that you continue on your current medications as directed. Please refer to the Current Medication list given to you today.  Labwork: None ordered  Testing/Procedures: None ordered  Follow-Up: Your physician wants you to follow-up in: 1 year with Dr. Curt Bears.  You will receive a reminder letter in the mail two months in advance. If you don't receive a letter, please call our office to schedule the follow-up appointment.   * If you need a refill on your cardiac medications before your next appointment, please call your pharmacy.   *Please note that any paperwork needing to be filled out by the provider will need to be addressed at the front desk prior to seeing the provider. Please note that any FMLA, disability or other documents regarding health condition is subject to a $25.00 charge that must be received prior to completion of paperwork in the form of a money order or check.  Thank you for choosing [facility]!!   Trinidad Curet, RN [phone]  Any Other Special Instructions Will Be Listed Below (If Applicable). Check in to [place] monitoring.  If you choose to get this monitor, please let the office know and we will also sign you up for MyChart.

## 2018-03-28 NOTE — Progress Notes (Signed)
Electrophysiology Office Note   Date:  03/28/2018   ID:  Dylan Hatfield, DOB May 16, 1966, MRN [id number]  PCP:  Colon Branch, MD Primary Electrophysiologist:  Dylan Mullarkey [name], MD    Chief Complaint  Patient presents with  . Palpitations     History of Present Illness: Dylan Hatfield is a 52 y.o. male who presents today for electrophysiology evaluation.   He presents today with complaints of palpitations. He says that since last being seen, his palpitations have improved. He has had 2 episodes since last being seen that were short lived and quite different from his prior episodes. We had discussed previously getting the alive core monitor, but he feels that it is a little too expensive at this time.  Today, denies symptoms of chest pain, shortness of breath, orthopnea, PND, lower extremity edema, claudication, dizziness, presyncope, syncope, bleeding, or neurologic sequela. The patient is tolerating medications without difficulties.  Overall he is feeling well.  He does continue to have episodic palpitations.  There are times that his palpitations occur once or twice a week, but there are also times that they occur months apart.  He does not have weakness or fatigue, but does have significant shortness of breath during his episodes of palpitations.  Past Medical History:  Diagnosis Date  . Arthritis   . Chronic back pain    bilateral L5/S1 transforaminal lumbar epidural steroid injections, Dr. Andree Hatfield  . DDD (degenerative disc disease), lumbosacral   . Lumbosacral spondylosis   . Sleep apnea    on CPAP, dx ~ 2008    Past Surgical History:  Procedure Laterality Date  . VASECTOMY       Current Outpatient Medications  Medication Sig Dispense Refill  . HYDROcodone-acetaminophen (NORCO) 10-325 MG per tablet Take 1 tablet by mouth as needed.    [name] ibuprofen (ADVIL) 200 MG tablet Take 200 mg by mouth every 6 (six) hours as needed.    [name] OVER THE COUNTER MEDICATION ZZZQuil 1 oz before  bedtime as needed.     Current Facility-Administered Medications  Medication Dose Route Frequency Provider Last Rate Last Dose  . 0.9 %  sodium chloride infusion  500 mL Intravenous Continuous Dylan Hatfield, Dylan Minks, MD        Allergies:   Patient has no known allergies.   Social History:  The patient  reports that he has quit smoking. He has quit using smokeless tobacco. He reports that he does not drink alcohol or use drugs.   Family History:  The patient's family history includes Arrhythmia in his maternal aunt; Breast cancer in his other; Stroke in his other.   ROS:  Please see the history of present illness.   Otherwise, review of systems is positive for none.   All other systems are reviewed and negative.   PHYSICAL EXAM: VS:  BP 134/86   Pulse (!) 54   Ht 6\' 3"  (1.905 m)   Wt 249 lb 3.2 oz (113 kg)   BMI 31.15 kg/m  , BMI Body mass index is 31.15 kg/m. GEN: Well nourished, well developed, in no acute distress  HEENT: normal  Neck: no JVD, carotid bruits, or masses Cardiac: RRR; no murmurs, rubs, or gallops,no edema  Respiratory:  clear to auscultation bilaterally, normal work of breathing GI: soft, nontender, nondistended, + BS MS: no deformity or atrophy  Skin: warm and dry Neuro:  Strength and sensation are intact Psych: euthymic mood, full affect  EKG:  EKG is ordered today. Personal  review of the ekg ordered shows sinus rhythm, nonspecific interventricular conduction delay, left axis deviation, rate 54  Recent Labs: 12/12/2017: ALT 26; BUN 26; Creatinine, Ser 0.99; Hemoglobin 15.8; Platelets 213.0; Potassium 4.3; Sodium 139    Lipid Panel     Component Value Date/Time   CHOL 174 12/12/2017 0835   TRIG 134.0 12/12/2017 0835   HDL 34.10 (L) 12/12/2017 0835   CHOLHDL 5 12/12/2017 0835   VLDL 26.8 12/12/2017 0835   LDLCALC 113 (H) 12/12/2017 0835   LDLDIRECT 104.0 11/25/2016 0915     Wt Readings from Last 3 Encounters:  03/28/18 249 lb 3.2 oz (113 kg)    12/12/17 251 lb (113.9 kg)  05/12/17 260 lb (117.9 kg)      Other studies Reviewed: Additional studies/ records that were reviewed today include: TTE 12/10/16  Review of the above records today demonstrates:  - Left ventricle: The cavity size was moderately dilated. There was   mild concentric hypertrophy. Systolic function was normal. The   estimated ejection fraction was in the range of 60% to 65%. Wall   motion was normal; there were no regional wall motion   abnormalities. Left ventricular diastolic function parameters   were normal. - Aorta: Aortic root dimension: 38 mm (ED). - Aortic root: The aortic root was mildly dilated. - Left atrium: The atrium was mildly dilated. - Pulmonary arteries: Systolic pressure could not be accurately   estimated.   ASSESSMENT AND PLAN:  1.  Palpitations: His palpitations have gotten worse and are occurring more frequently.  Due to that, he would prefer cardiac monitoring.  Unfortunately he is not having palpitations once a month and thus he would benefit from long-term monitoring.  He would like to try a alive core.  He Cyann Venti call us with results and we Arletha Marschke get him into the office once we have data for therapy.   2. Obstructive sleep apnea: CPAP compliance encouraged  Current medicines are reviewed at length with the patient today.   The patient does not have concerns regarding his medicines.  The following changes were made today:    Labs/ tests ordered today include:  Orders Placed This Encounter  Procedures  . EKG 12-Lead     Disposition:   FU with Tecora Eustache 12 months  Signed, Dylan Hatfield [name], MD  03/28/2018 8:48 AM     [facility] [address] [place] [address] [phone] (office) [phone] (fax)

## 2018-04-25 DIAGNOSIS — G4733 Obstructive sleep apnea (adult) (pediatric): Secondary | ICD-10-CM | POA: Diagnosis not present

## 2018-05-15 ENCOUNTER — Ambulatory Visit: Payer: 59 | Admitting: Neurology

## 2018-05-15 ENCOUNTER — Encounter: Payer: Self-pay | Admitting: Neurology

## 2018-05-15 VITALS — BP 143/96 | HR 67 | Ht 76.0 in | Wt 252.0 lb

## 2018-05-15 DIAGNOSIS — G4733 Obstructive sleep apnea (adult) (pediatric): Secondary | ICD-10-CM | POA: Insufficient documentation

## 2018-05-15 DIAGNOSIS — Z9989 Dependence on other enabling machines and devices: Secondary | ICD-10-CM

## 2018-05-15 NOTE — Progress Notes (Signed)
SLEEP MEDICINE CLINIC   Provider:  Larey Seat, M D  Referring Provider: Colon Branch, MD Primary Care Physician:  Colon Branch, MD  Chief Complaint  Patient presents with  . Follow-up    pt alone, rm 11. pt states that he is trying to get apt with [employer] for his machine.     HPI:   Dylan Hatfield is a 52 y.o. Male, seen here for a compliance visit CPAP-today is 47 August 2019, and Mr. Dylan Hatfield is here today with a wife I obtain download from his CPAP machine but 100% compliance!.  Average use of time per day is 7 hours 35 minutes, the machine is an AutoSet which operates in the pressure window between 4 and 15 cmH2O with 3 cm water expiratory pressure relief.  The residual AHI also known as sleep apnea hypercapnia index is 0.5/h and speaks of an excellent resolution of apnea there is very little air leak in the 95th percentile pressure is around 9.7 cm water. He likes his machine and FFM, and he can't sleep nearly as well without it. Epworth SS is 5/ 24 points and FSS at 20/ 63 points. No depression.  He continues to work, mostly indoors, he drives.    Interval history from 05/12/2017, Mr. Dylan Hatfield underwent a home sleep test on 01/22/2017 it showed only mild sleep apnea but strictly obstructive in nature. The patient has been used to CPAP therapy and I ordered and auto- titration. The patient did not have prolonged hypoxemia so inferiorly he could also be a good 10 dental device candidate should he choose not to use CPAP in the future. The data downloads available show 30 out of 30 days of 100% of use CPAP for 7 hours and 35 minutes on average. The machine has a pressure range between 4 and 15 cm water with 3 cm EPR and his residual AHI is only 0.5/hr. The 95% pressure is 8.7cm. He is loosing weight,  He lost 25 pounds.    Dylan Hatfield is a 52 y.o. male , he was  seen here as a referral from Dr. Larose Kells for a sleep consultation on 12-15-2016 ,  12-15-2016 Mr. Langhorst has been using  CPAP for a dozen years, and continues to be compliant. He does need new supplies and probably a new machine. He has a history of arthritis, chronic back pain, degenerative disc disease in the lumbosacral region, spondylosis and sleep apnea. 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 His only regular taken medication is ibuprofen. Sleep habits are as follows: He has no trouble initiating sleep in his usual bedtime is around 9 PM. He prefers to sleep on his side, uses 3 pillows. His bedroom is dark, cool and quiet. His wife is sharing the bedroom and has not noted any sleep behaviors. He has been using CPAP compliantly, he does not snore while using CPAP. He has usually 2-3 bathroom breaks each night, but he is able to fall back asleep promptly. He rises in the morning at 5 AM, after an estimate of 7 hours of nocturnal sleep. He has developed morning headaches in the center of his forehead, he wakes now up with a dry mouth which wasn't the case a year ago. This may be well related to the failure to humidify the CPAP and an ill fitting facial interface. He has always used a fullface mask. He was last given a fresh mask about a year ago. [facility].  Sleep medical history and family sleep history:  He strongly believes his father has obstructive sleep apnea but he has never been diagnosed or treated. He does not think his brother has apnea. His children also has not developed apnea. No family history of insomnia or restless legs.He was a sleep walker in childhood, no history of night terrors and no enuresis.  Social history:  Married , 2 children, non smoker- quit in his youth, non ETOH drinker , since 11-2009, caffeine - 2 cups in AM , no soda, rarely iced tea.   Review of Systems: Out of a complete 14 system review, the patient complains of only the following symptoms, and all other reviewed systems are negative. palpitation, some at night. Cardio work up , arranged for cardiac monitor.   Epworth  score still 5, Fatigue severity score 18 from 29  , depression score n/a    Social History   Socioeconomic History  . Marital status: Married    Spouse name: Not on file  . Number of children: 2  . Years of education: Not on file  . Highest education level: Not on file  Occupational History  . 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[employer]  Social Needs  . Financial resource strain: Not on file  . Food insecurity:    Worry: Not on file    Inability: Not on file  . Transportation needs:    Medical: Not on file    Non-medical: Not on file  Tobacco Use  . Smoking status: Former Research scientist (life sciences)  . Smokeless tobacco: Former Systems developer  . Tobacco comment: 1 ppd, quit 2003  Substance and Sexual Activity  . Alcohol use: No  . Drug use: No    Types: Hydrocodone  . Sexual activity: Yes  Lifestyle  . Physical activity:    Days per week: Not on file    Minutes per session: Not on file  . Stress: Not on file  Relationships  . Social connections:    Talks on phone: Not on file    Gets together: Not on file    Attends religious service: Not on file    Active member of club or organization: Not on file    Attends meetings of clubs or organizations: Not on file    Relationship status: Not on file  . Intimate partner violence:    Fear of current or ex partner: Not on file    Emotionally abused: Not on file    Physically abused: Not on file    Forced sexual activity: Not on file  Other Topics Concern  . Not on file  Social History Narrative   Daughter in [place]    Son in [place]    Family History  Problem Relation Age of Onset  . Stroke Other        GM stroke 73  . Breast cancer Other        GM  . Arrhythmia Maternal Aunt   . Diabetes Neg Hx   . CAD Neg Hx   . Colon cancer Neg Hx   . Prostate cancer Neg Hx   . Esophageal cancer Neg Hx   . Rectal cancer Neg Hx   . Stomach cancer Neg Hx     Past Medical History:  Diagnosis Date  . Arthritis   .  Chronic back pain    bilateral L5/S1 transforaminal lumbar epidural steroid injections, Dr. [name]  . DDD (degenerative disc disease), lumbosacral   .  Lumbosacral spondylosis   . Sleep apnea    on CPAP, dx ~ 2008     Past Surgical History:  Procedure Laterality Date  . VASECTOMY      Current Outpatient Medications  Medication Sig Dispense Refill  . HYDROcodone-acetaminophen (NORCO) 10-325 MG per tablet Take 1 tablet by mouth as needed.    [name] ibuprofen (ADVIL) 200 MG tablet Take 200 mg by mouth every 6 (six) hours as needed.    [name] OVER THE COUNTER MEDICATION ZZZQuil 1 oz before bedtime as needed.     Current Facility-Administered Medications  Medication Dose Route Frequency Provider Last Rate Last Dose  . 0.9 %  sodium chloride infusion  500 mL Intravenous Continuous Mauri Pole, MD        Allergies as of 05/15/2018  . (No Known Allergies)    Vitals: BP (!) 143/96   Pulse 67   Ht 6\' 4"  (1.93 m)   Wt 252 lb (114.3 kg)   BMI 30.67 kg/m  Last Weight:  Wt Readings from Last 1 Encounters:  05/15/18 252 lb (114.3 kg)   MEQ:ASTM mass index is 30.67 kg/m.     Last Height:   Ht Readings from Last 1 Encounters:  05/15/18 6\' 4"  (1.93 m)    Physical exam:  General: The patient is awake, alert and appears not in acute distress. The patient is well groomed. Head: Normocephalic, atraumatic. Neck is supple. Mallampati 3 from 4 plus !   neck circumference remains at 18.25 . Nasal airflow patent, Retrognathia is not seen, but a crowded dental status.  Cardiovascular:  Regular rate and rhythm .Respiratory: Lungs are clear to auscultation. Skin:  Without evidence of edema, or rash. Tanned,  Trunk: BMI is 30 down from 33. The patient's posture is erect . He has less knee pain.   Neurologic exam Mood and affect are appropriate.  Cranial nerves: Pupils are equal and briskly reactive to light. Extraocular movements  in vertical and horizontal planes intact and without nystagmus.  Facial sensation intact to fine touch.Facial motor strength is symmetric , his tongue and uvula moved midline. Shoulder shrug was symmetrical.  Sensory intact to fine touch and vibration. No finger numbness.  Coordination - no changes in handwriting, has middle finger and ring finger arthritis, or trigger finger, dropping objects form the left hand only.  .  Motor exam:  Normal tone, muscle bulk and symmetric strength in all extremities.  His left grip is weaker, though /   Deep tendon reflexes: in the  upper and lower extremities are symmetric and intact.     I spent not more than 15 minutes of face to face time with the patient. Greater than 50% of time was spent in counseling and coordination of care. We have discussed the diagnosis and differential and I answered the patient's questions.     Assessment:  After physical and neurologic examination, review of laboratory studies,  Personal review of imaging studies, reports of other /same  Imaging studies ,  Results of polysomnography/ neurophysiology testing and pre-existing records as far as provided in visit., my assessment is   1)  OSA -  The home sleep test in 2018  had confirmed the presence of obstructive sleep apnea but at a mild degree without associated oxygen desaturation, prolonged hypoxemia, there was a range of heart rate but indicated tachybradycardia with variability between 47 and 116 bpm. He is doing excellent on his current AutoSet and no further changes have to be made. He  will contact [facility] to see if he can change to a different mask. He prefers a full face mask model , looks for a larger size-Airtouch. He continues to be 100% compliant and use a setting between 4 and 15 cm water, 3 cm EPR, AHI was  0.5/h.   Asencion Partridge Sydney Azure MD  RV with NP or me in 12 month  .   05/15/2018   CC: Colon Branch, [place] [facility] [place], [place] [address]

## 2018-06-08 DIAGNOSIS — M47816 Spondylosis without myelopathy or radiculopathy, lumbar region: Secondary | ICD-10-CM | POA: Diagnosis not present

## 2018-06-08 DIAGNOSIS — Z79899 Other long term (current) drug therapy: Secondary | ICD-10-CM | POA: Diagnosis not present

## 2018-06-08 DIAGNOSIS — M79606 Pain in leg, unspecified: Secondary | ICD-10-CM | POA: Diagnosis not present

## 2018-06-08 DIAGNOSIS — Z79891 Long term (current) use of opiate analgesic: Secondary | ICD-10-CM | POA: Diagnosis not present

## 2018-06-08 DIAGNOSIS — G894 Chronic pain syndrome: Secondary | ICD-10-CM | POA: Diagnosis not present

## 2018-07-04 DIAGNOSIS — M65342 Trigger finger, left ring finger: Secondary | ICD-10-CM | POA: Diagnosis not present

## 2018-07-04 DIAGNOSIS — M65341 Trigger finger, right ring finger: Secondary | ICD-10-CM | POA: Diagnosis not present

## 2018-07-04 DIAGNOSIS — M65332 Trigger finger, left middle finger: Secondary | ICD-10-CM | POA: Diagnosis not present

## 2018-08-01 DIAGNOSIS — M65332 Trigger finger, left middle finger: Secondary | ICD-10-CM | POA: Diagnosis not present

## 2018-08-01 DIAGNOSIS — M65342 Trigger finger, left ring finger: Secondary | ICD-10-CM | POA: Diagnosis not present

## 2018-08-01 DIAGNOSIS — M65331 Trigger finger, right middle finger: Secondary | ICD-10-CM | POA: Diagnosis not present

## 2018-08-03 DIAGNOSIS — G894 Chronic pain syndrome: Secondary | ICD-10-CM | POA: Diagnosis not present

## 2018-08-03 DIAGNOSIS — M47816 Spondylosis without myelopathy or radiculopathy, lumbar region: Secondary | ICD-10-CM | POA: Diagnosis not present

## 2018-08-03 DIAGNOSIS — Z79899 Other long term (current) drug therapy: Secondary | ICD-10-CM | POA: Diagnosis not present

## 2018-08-03 DIAGNOSIS — M5136 Other intervertebral disc degeneration, lumbar region: Secondary | ICD-10-CM | POA: Diagnosis not present

## 2018-08-03 DIAGNOSIS — Z79891 Long term (current) use of opiate analgesic: Secondary | ICD-10-CM | POA: Diagnosis not present

## 2018-08-10 DIAGNOSIS — G4733 Obstructive sleep apnea (adult) (pediatric): Secondary | ICD-10-CM | POA: Diagnosis not present

## 2018-08-29 DIAGNOSIS — M65332 Trigger finger, left middle finger: Secondary | ICD-10-CM | POA: Diagnosis not present

## 2018-08-29 DIAGNOSIS — M65331 Trigger finger, right middle finger: Secondary | ICD-10-CM | POA: Diagnosis not present

## 2018-08-29 DIAGNOSIS — M65342 Trigger finger, left ring finger: Secondary | ICD-10-CM | POA: Diagnosis not present

## 2018-10-05 DIAGNOSIS — G894 Chronic pain syndrome: Secondary | ICD-10-CM | POA: Diagnosis not present

## 2018-10-05 DIAGNOSIS — Z79899 Other long term (current) drug therapy: Secondary | ICD-10-CM | POA: Diagnosis not present

## 2018-10-05 DIAGNOSIS — M79606 Pain in leg, unspecified: Secondary | ICD-10-CM | POA: Diagnosis not present

## 2018-10-05 DIAGNOSIS — Z79891 Long term (current) use of opiate analgesic: Secondary | ICD-10-CM | POA: Diagnosis not present

## 2018-10-05 DIAGNOSIS — M47816 Spondylosis without myelopathy or radiculopathy, lumbar region: Secondary | ICD-10-CM | POA: Diagnosis not present

## 2018-11-30 DIAGNOSIS — Z79891 Long term (current) use of opiate analgesic: Secondary | ICD-10-CM | POA: Diagnosis not present

## 2018-11-30 DIAGNOSIS — Z79899 Other long term (current) drug therapy: Secondary | ICD-10-CM | POA: Diagnosis not present

## 2018-11-30 DIAGNOSIS — G894 Chronic pain syndrome: Secondary | ICD-10-CM | POA: Diagnosis not present

## 2018-11-30 DIAGNOSIS — M79606 Pain in leg, unspecified: Secondary | ICD-10-CM | POA: Diagnosis not present

## 2018-11-30 DIAGNOSIS — M47816 Spondylosis without myelopathy or radiculopathy, lumbar region: Secondary | ICD-10-CM | POA: Diagnosis not present

## 2018-12-08 DIAGNOSIS — G4733 Obstructive sleep apnea (adult) (pediatric): Secondary | ICD-10-CM | POA: Diagnosis not present

## 2018-12-13 DIAGNOSIS — M47817 Spondylosis without myelopathy or radiculopathy, lumbosacral region: Secondary | ICD-10-CM | POA: Diagnosis not present

## 2018-12-14 ENCOUNTER — Other Ambulatory Visit: Payer: Self-pay

## 2018-12-14 ENCOUNTER — Encounter: Payer: Self-pay | Admitting: Internal Medicine

## 2018-12-14 ENCOUNTER — Ambulatory Visit (INDEPENDENT_AMBULATORY_CARE_PROVIDER_SITE_OTHER): Payer: 59 | Admitting: Internal Medicine

## 2018-12-14 VITALS — BP 128/70 | HR 64 | Temp 98.0°F | Resp 16 | Ht 76.0 in | Wt 259.5 lb

## 2018-12-14 DIAGNOSIS — Z125 Encounter for screening for malignant neoplasm of prostate: Secondary | ICD-10-CM | POA: Diagnosis not present

## 2018-12-14 DIAGNOSIS — Z0000 Encounter for general adult medical examination without abnormal findings: Secondary | ICD-10-CM | POA: Diagnosis not present

## 2018-12-14 DIAGNOSIS — Z23 Encounter for immunization: Secondary | ICD-10-CM | POA: Diagnosis not present

## 2018-12-14 LAB — COMPREHENSIVE METABOLIC PANEL
ALBUMIN: 4.8 g/dL (ref 3.5–5.2)
ALK PHOS: 44 U/L (ref 39–117)
ALT: 35 U/L (ref 0–53)
AST: 25 U/L (ref 0–37)
BILIRUBIN TOTAL: 0.8 mg/dL (ref 0.2–1.2)
BUN: 22 mg/dL (ref 6–23)
CALCIUM: 9.7 mg/dL (ref 8.4–10.5)
CO2: 29 mEq/L (ref 19–32)
Chloride: 101 mEq/L (ref 96–112)
Creatinine, Ser: 0.96 mg/dL (ref 0.40–1.50)
GFR: 81.78 mL/min (ref >60.00)
Glucose, Bld: 115 mg/dL — ABNORMAL HIGH (ref 70–99)
POTASSIUM: 4.6 meq/L (ref 3.5–5.1)
Sodium: 138 mEq/L (ref 135–145)
TOTAL PROTEIN: 6.6 g/dL (ref 6.0–8.3)

## 2018-12-14 LAB — CBC WITH DIFFERENTIAL/PLATELET
BASOS PCT: 0.5 % (ref 0.0–3.0)
Basophils Absolute: 0 10*3/uL (ref 0.0–0.1)
EOS PCT: 1.8 % (ref 0.0–5.0)
Eosinophils Absolute: 0.1 10*3/uL (ref 0.0–0.7)
HCT: 48.4 % (ref 39.0–52.0)
Hemoglobin: 16.6 g/dL (ref 13.0–17.0)
LYMPHS ABS: 1.8 10*3/uL (ref 0.7–4.0)
Lymphocytes Relative: 25.4 % (ref 12.0–46.0)
MCHC: 34.3 g/dL (ref 30.0–36.0)
MCV: 86.7 fl (ref 78.0–100.0)
MONOS PCT: 5.6 % (ref 3.0–12.0)
Monocytes Absolute: 0.4 10*3/uL (ref 0.1–1.0)
NEUTROS ABS: 4.7 10*3/uL (ref 1.4–7.7)
NEUTROS PCT: 66.7 % (ref 43.0–77.0)
Platelets: 217 10*3/uL (ref 150.0–400.0)
RBC: 5.59 Mil/uL (ref 4.22–5.81)
RDW: 13.9 % (ref 11.5–15.5)
WBC: 7 10*3/uL (ref 4.0–10.5)

## 2018-12-14 LAB — LIPID PANEL
CHOLESTEROL: 186 mg/dL (ref 0–200)
HDL: 32.9 mg/dL — AB (ref >39.00)
LDL Cholesterol: 114 mg/dL — ABNORMAL HIGH (ref 0–99)
NONHDL: 153.33
TRIGLYCERIDES: 196 mg/dL — AB (ref 0.0–149.0)
Total CHOL/HDL Ratio: 6
VLDL: 39.2 mg/dL (ref 0.0–40.0)

## 2018-12-14 LAB — PSA: PSA: 1.01 ng/mL (ref 0.10–4.00)

## 2018-12-14 NOTE — Progress Notes (Signed)
Pre visit review using our clinic review tool, if applicable. No additional management support is needed unless otherwise documented below in the visit note. 

## 2018-12-14 NOTE — Progress Notes (Signed)
Subjective:    Patient ID: Dylan Hatfield, male    DOB: Jul 23, 1966, 53 y.o.   MRN: [id number]  DOS:  12/14/2018 Type of visit - description: Here for CPX Feeling well.  Wt Readings from Last 3 Encounters:  12/14/18 259 lb 8 oz (117.7 kg)  05/15/18 252 lb (114.3 kg)  03/28/18 249 lb 3.2 oz (113 kg)   Review of Systems No new concerns, reports he is doing very good, occasionally has nocturia; LUTS in general are better than before  Other than above, a 14 point review of systems is negative   Past Medical History:  Diagnosis Date  . Arthritis   . Chronic back pain    bilateral L5/S1 transforaminal lumbar epidural steroid injections, Dr. [name]  . DDD (degenerative disc disease), lumbosacral   . Lumbosacral spondylosis   . Sleep apnea    on CPAP, dx ~ 2008     Past Surgical History:  Procedure Laterality Date  . VASECTOMY      Social History   Socioeconomic History  . Marital status: Married    Spouse name: Not on file  . Number of children: 2  . Years of education: Not on file  . Highest education level: Not on file  Occupational History  . 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[employer]  Social Needs  . Financial resource strain: Not on file  . Food insecurity:    Worry: Not on file    Inability: Not on file  . Transportation needs:    Medical: Not on file    Non-medical: Not on file  Tobacco Use  . Smoking status: Former Research scientist (life sciences)  . Smokeless tobacco: Former Systems developer  . Tobacco comment: 1 ppd, quit 2003  Substance and Sexual Activity  . Alcohol use: No  . Drug use: No    Types: Hydrocodone  . Sexual activity: Yes  Lifestyle  . Physical activity:    Days per week: Not on file    Minutes per session: Not on file  . Stress: Not on file  Relationships  . Social connections:    Talks on phone: Not on file    Gets together: Not on file    Attends religious service: Not on file    Active member of club or organization: Not  on file    Attends meetings of clubs or organizations: Not on file    Relationship status: Not on file  . Intimate partner violence:    Fear of current or ex partner: Not on file    Emotionally abused: Not on file    Physically abused: Not on file    Forced sexual activity: Not on file  Other Topics Concern  . Not on file  Social History Narrative   Daughter: graduated from college   Son : graduated HS, works w/ father      Family History  Problem Relation Age of Onset  . Lung cancer Father   . Stroke Other        GM stroke 56  . Breast cancer Other        GM  . Arrhythmia Maternal Aunt   . Diabetes Neg Hx   . CAD Neg Hx   . Colon cancer Neg Hx   . Prostate cancer Neg Hx   . Esophageal cancer Neg Hx   . Rectal cancer Neg Hx   . Stomach cancer Neg Hx      Allergies as of  12/14/2018   No Known Allergies     Medication List       Accurate as of December 14, 2018  4:20 PM. Always use your most recent med list.        Advil 200 MG tablet Generic drug:  ibuprofen Take 200 mg by mouth every 6 (six) hours as needed.   HYDROcodone-acetaminophen 10-325 MG tablet Commonly known as:  NORCO Take 1 tablet by mouth as needed.   OVER THE COUNTER MEDICATION ZZZQuil 1 oz before bedtime as needed.           Objective:   Physical Exam BP 128/70 (BP Location: Left Arm, Patient Position: Sitting, Cuff Size: Normal)   Pulse 64   Temp 98 F (36.7 C) (Oral)   Resp 16   Ht 6\' 4"  (1.93 m)   Wt 259 lb 8 oz (117.7 kg)   SpO2 98%   BMI 31.59 kg/m  General: Well developed, NAD, BMI noted Neck: No  thyromegaly  HEENT:  Normocephalic . Face symmetric, atraumatic Lungs:  CTA B Normal respiratory effort, no intercostal retractions, no accessory muscle use. Heart: RRR,  no murmur.  No pretibial edema bilaterally  Abdomen:  Not distended, soft, non-tender. No rebound or rigidity.   Skin: Exposed areas without rash. Not pale. Not jaundice DRE: Stools, normal sphincter tone,  normal prostate without nodularities or enlargement. Neurologic:  alert & oriented X3.  Speech normal, gait appropriate for age and unassisted Strength symmetric and appropriate for age.  Psych: Cognition and judgment appear intact.  Cooperative with normal attention span and concentration.  Behavior appropriate. No anxious or depressed appearing.     Assessment     Assessment  OSA   Dx 2008, on CPAP MSK: DJD Trigger fingers  Chronic back pain-- sees pain mngmt  LUTS (nocturia x years, flomax caused retrograde ejaculation) Bony mass deformity of the chest wall, XR 06-2014 -- benign changes Lipoma: At the mid back, diameter 10 cm    Palpitations: saw cards (echo 2018)   PLAN      Trigger finger: Had local injections that helped temporarily, still having issues, plans to see orthopedic surgery at some point Chronic back pain: Managed elsewhere, takes pain medication as needed only. Palpitations: Saw cardiology, currently with no symptoms. RTC 1 year

## 2018-12-14 NOTE — Assessment & Plan Note (Signed)
-  Td 2020, had no flu shot this season   CCS: Colonoscopy 01/2017, + polyps, 5 years Prostate cancer screening:  PSA wnl, check a PSA Labs: CMP, FLP, CBC, PSA Diet: Healthy for the most part. Exercise : active, walks daily , hunting

## 2018-12-14 NOTE — Assessment & Plan Note (Signed)
Trigger finger: Had local injections that helped temporarily, still having issues, plans to see orthopedic surgery at some point Chronic back pain: Managed elsewhere, takes pain medication as needed only. Palpitations: Saw cardiology, currently with no symptoms. RTC 1 year

## 2018-12-14 NOTE — Patient Instructions (Signed)
GO TO THE LAB : Get the blood work     GO TO THE FRONT DESK Schedule your next appointment   For a physical in 1 year  

## 2018-12-20 ENCOUNTER — Telehealth: Payer: Self-pay | Admitting: Internal Medicine

## 2018-12-20 ENCOUNTER — Ambulatory Visit: Payer: 59 | Admitting: Internal Medicine

## 2018-12-20 NOTE — Telephone Encounter (Signed)
Virtual visit scheduled 3/26 at 0830.

## 2018-12-20 NOTE — Telephone Encounter (Signed)
Pt called and said that he is have diarrhea x3 , no fever, no coughing or congestion. Wants to know if he needs to be seen or do next.

## 2018-12-21 ENCOUNTER — Ambulatory Visit (INDEPENDENT_AMBULATORY_CARE_PROVIDER_SITE_OTHER): Payer: 59 | Admitting: Internal Medicine

## 2018-12-21 ENCOUNTER — Encounter: Payer: Self-pay | Admitting: Internal Medicine

## 2018-12-21 ENCOUNTER — Other Ambulatory Visit: Payer: Self-pay

## 2018-12-21 DIAGNOSIS — R197 Diarrhea, unspecified: Secondary | ICD-10-CM | POA: Diagnosis not present

## 2018-12-21 NOTE — Progress Notes (Signed)
Subjective:    Patient ID: Dylan Hatfield, male    DOB: 1966-04-13, 53 y.o.   MRN: [id number]  DOS:  12/21/2018 Type of visit - description: Virtual visit, video Symptoms of started 12/18/2018: Several episodes of brown loose/watery stools.  He has taking Imodium and feels better in the last 12 hours. Last Imodium dose was yesterday. He has no history of recent antibiotics prescription. His brother had similar symptoms and is now better.   Review of Systems Denies abdominal pain per se No nausea, vomiting, blood in the stools. No fever, chills or respiratory symptoms.  Past Medical History:  Diagnosis Date  . Arthritis   . Chronic back pain    bilateral L5/S1 transforaminal lumbar epidural steroid injections, Dr. [name]  . DDD (degenerative disc disease), lumbosacral   . Lumbosacral spondylosis   . Sleep apnea    on CPAP, dx ~ 2008     Past Surgical History:  Procedure Laterality Date  . VASECTOMY      Social History   Socioeconomic History  . Marital status: Married    Spouse name: Not on file  . Number of children: 2  . Years of education: Not on file  . Highest education level: Not on file  Occupational History  . 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[employer]  Social Needs  . Financial resource strain: Not on file  . Food insecurity:    Worry: Not on file    Inability: Not on file  . Transportation needs:    Medical: Not on file    Non-medical: Not on file  Tobacco Use  . Smoking status: Former Research scientist (life sciences)  . Smokeless tobacco: Former Systems developer  . Tobacco comment: 1 ppd, quit 2003  Substance and Sexual Activity  . Alcohol use: No  . Drug use: No    Types: Hydrocodone  . Sexual activity: Yes  Lifestyle  . Physical activity:    Days per week: Not on file    Minutes per session: Not on file  . Stress: Not on file  Relationships  . Social connections:    Talks on phone: Not on file    Gets together: Not on file    Attends  religious service: Not on file    Active member of club or organization: Not on file    Attends meetings of clubs or organizations: Not on file    Relationship status: Not on file  . Intimate partner violence:    Fear of current or ex partner: Not on file    Emotionally abused: Not on file    Physically abused: Not on file    Forced sexual activity: Not on file  Other Topics Concern  . Not on file  Social History Narrative   Daughter: graduated from college   Son : graduated HS, works w/ father       Allergies as of 12/21/2018   No Known Allergies     Medication List       Accurate as of December 21, 2018  8:43 AM. Always use your most recent med list.        Advil 200 MG tablet Generic drug:  ibuprofen Take 200 mg by mouth every 6 (six) hours as needed.   HYDROcodone-acetaminophen 10-325 MG tablet Commonly known as:  NORCO Take 1 tablet by mouth as needed.   OVER THE COUNTER MEDICATION ZZZQuil 1 oz before bedtime as needed.  Objective:   Physical Exam There were no vitals taken for this visit. 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  Will call if symptoms return or if he has fever, chills or blood in the stools.

## 2018-12-22 NOTE — Assessment & Plan Note (Signed)
Acute diarrhea: The patient has acute episode of diarrhea without red flag symptoms, he is better today, we agreed on aggressive hydration and otherwise observation.  Will call if symptoms return or if he has fever, chills or blood in the stools.

## 2019-02-01 DIAGNOSIS — Z79899 Other long term (current) drug therapy: Secondary | ICD-10-CM | POA: Diagnosis not present

## 2019-02-01 DIAGNOSIS — Z79891 Long term (current) use of opiate analgesic: Secondary | ICD-10-CM | POA: Diagnosis not present

## 2019-02-01 DIAGNOSIS — M47816 Spondylosis without myelopathy or radiculopathy, lumbar region: Secondary | ICD-10-CM | POA: Diagnosis not present

## 2019-02-01 DIAGNOSIS — M5136 Other intervertebral disc degeneration, lumbar region: Secondary | ICD-10-CM | POA: Diagnosis not present

## 2019-02-01 DIAGNOSIS — G894 Chronic pain syndrome: Secondary | ICD-10-CM | POA: Diagnosis not present

## 2019-04-23 ENCOUNTER — Telehealth: Payer: Self-pay | Admitting: Cardiology

## 2019-04-23 NOTE — Telephone Encounter (Signed)

## 2019-04-24 ENCOUNTER — Encounter: Payer: Self-pay | Admitting: Cardiology

## 2019-04-24 ENCOUNTER — Ambulatory Visit: Payer: 59 | Admitting: Cardiology

## 2019-04-24 ENCOUNTER — Other Ambulatory Visit: Payer: Self-pay

## 2019-04-24 VITALS — BP 144/82 | HR 64 | Ht 76.0 in | Wt 272.0 lb

## 2019-04-24 DIAGNOSIS — R002 Palpitations: Secondary | ICD-10-CM

## 2019-04-24 NOTE — Progress Notes (Signed)
Electrophysiology Office Note   Date:  04/24/2019   ID:  KEISHON CHAVARIN, DOB 12/17/1965, MRN [id number]  PCP:  Colon Branch, MD Primary Electrophysiologist:  Jonothan Heberle [name], MD    No chief complaint on file.    History of Present Illness: Dylan Hatfield is a 53 y.o. male who presents today for electrophysiology evaluation.   He presents today with complaints of palpitations. He says that since last being seen, his palpitations have improved. He has had 2 episodes since last being seen that were short lived and quite different from his prior episodes. We had discussed previously getting the alive core monitor, but he feels that it is a little too expensive at this time.  Today, denies symptoms of palpitations, chest pain, shortness of breath, orthopnea, PND, lower extremity edema, claudication, dizziness, presyncope, syncope, bleeding, or neurologic sequela. The patient is tolerating medications without difficulties.  Overall he is doing well.  He is not had palpitations over the last few months.  He did get a cardia app that has shown mainly sinus rhythm with quite a bit of artifact.  Past Medical History:  Diagnosis Date  . Arthritis   . Chronic back pain    bilateral L5/S1 transforaminal lumbar epidural steroid injections, Dr. [name]  . DDD (degenerative disc disease), lumbosacral   . Lumbosacral spondylosis   . Sleep apnea    on CPAP, dx ~ 2008    Past Surgical History:  Procedure Laterality Date  . VASECTOMY       Current Outpatient Medications  Medication Sig Dispense Refill  . HYDROcodone-acetaminophen (NORCO) 10-325 MG per tablet Take 1 tablet by mouth as needed.    [name] ibuprofen (ADVIL) 200 MG tablet Take 200 mg by mouth every 6 (six) hours as needed.    [name] OVER THE COUNTER MEDICATION ZZZQuil 1 oz before bedtime as needed.     No current facility-administered medications for this visit.     Allergies:   Patient has no known allergies.   Social History:  The  patient  reports that he has quit smoking. He has quit using smokeless tobacco. He reports that he does not drink alcohol or use drugs.   Family History:  The patient's family history includes Arrhythmia in his maternal aunt; Breast cancer in an other family member; Lung cancer in his father; Stroke in an other family member.   ROS:  Please see the history of present illness.   Otherwise, review of systems is positive for none.   All other systems are reviewed and negative.   PHYSICAL EXAM: VS:  There were no vitals taken for this visit. , BMI There is no height or weight on file to calculate BMI. GEN: Well nourished, well developed, in no acute distress  HEENT: normal  Neck: no JVD, carotid bruits, or masses Cardiac: RRR; no murmurs, rubs, or gallops,no edema  Respiratory:  clear to auscultation bilaterally, normal work of breathing GI: soft, nontender, nondistended, + BS MS: no deformity or atrophy  Skin: warm and dry Neuro:  Strength and sensation are intact Psych: euthymic mood, full affect  EKG:  EKG is ordered today. Personal review of the ekg ordered shows sinus rhythm, left anterior fascicular block, rate 64  Recent Labs: 12/14/2018: ALT 35; BUN 22; Creatinine, Ser 0.96; Hemoglobin 16.6; Platelets 217.0; Potassium 4.6; Sodium 138    Lipid Panel     Component Value Date/Time   CHOL 186 12/14/2018 0826   TRIG 196.0 (H) 12/14/2018 5170  HDL 32.90 (L) 12/14/2018 0826   CHOLHDL 6 12/14/2018 0826   VLDL 39.2 12/14/2018 0826   LDLCALC 114 (H) 12/14/2018 0826   LDLDIRECT 104.0 11/25/2016 0915     Wt Readings from Last 3 Encounters:  12/14/18 259 lb 8 oz (117.7 kg)  05/15/18 252 lb (114.3 kg)  03/28/18 249 lb 3.2 oz (113 kg)      Other studies Reviewed: Additional studies/ records that were reviewed today include: TTE 12/10/16  Review of the above records today demonstrates:  - Left ventricle: The cavity size was moderately dilated. There was   mild concentric  hypertrophy. Systolic function was normal. The   estimated ejection fraction was in the range of 60% to 65%. Wall   motion was normal; there were no regional wall motion   abnormalities. Left ventricular diastolic function parameters   were normal. - Aorta: Aortic root dimension: 38 mm (ED). - Aortic root: The aortic root was mildly dilated. - Left atrium: The atrium was mildly dilated. - Pulmonary arteries: Systolic pressure could not be accurately   estimated.   ASSESSMENT AND PLAN:  1.  Palpitations: He does bring in cardia recordings when he is having symptoms.  Most of his recordings reveal sinus rhythm.  He certainly has recordings with quite a bit of artifact and is difficult to determine his rhythm.  At this point, I would hold off on further therapy.  His blood pressure is mildly elevated today.  It has been normal in the past.  If he does need medications for blood pressure, would likely choose carvedilol as this could help with what ever palpitations he has.   2. Obstructive sleep apnea: CPAP compliance encouraged  Current medicines are reviewed at length with the patient today.   The patient does not have concerns regarding his medicines.  The following changes were made today: None  Labs/ tests ordered today include:  Orders Placed This Encounter  Procedures  . EKG 12-Lead     Disposition:   FU with Anquan Azzarello 12 months  Signed, Hatim Homann [name], MD  04/24/2019 8:43 AM     [facility] [address] [place] [address] [phone] (office) [phone] (fax)

## 2019-04-24 NOTE — Patient Instructions (Signed)
Medication Instructions:  Your physician recommends that you continue on your current medications as directed. Please refer to the Current Medication list given to you today.  * If you need a refill on your cardiac medications before your next appointment, please call your pharmacy.   Labwork: None ordered  Testing/Procedures: None ordered  Follow-Up: Your physician wants you to follow-up in: 1 year with Dr. Camnitz.  You will receive a reminder letter in the mail two months in advance. If you don't receive a letter, please call our office to schedule the follow-up appointment.  Thank you for choosing [facility]!!   Yassmin Binegar, RN [phone]        

## 2019-05-09 IMAGING — CR DG LUMBAR SPINE COMPLETE 4+V
5 series · 5 of 5 positions shown · non-contrast
Comparison: 02/24/2013

CLINICAL DATA: Chronic low back pain

EXAM:
LUMBAR SPINE - COMPLETE 4+ VIEW

[w lumbar spine ap]
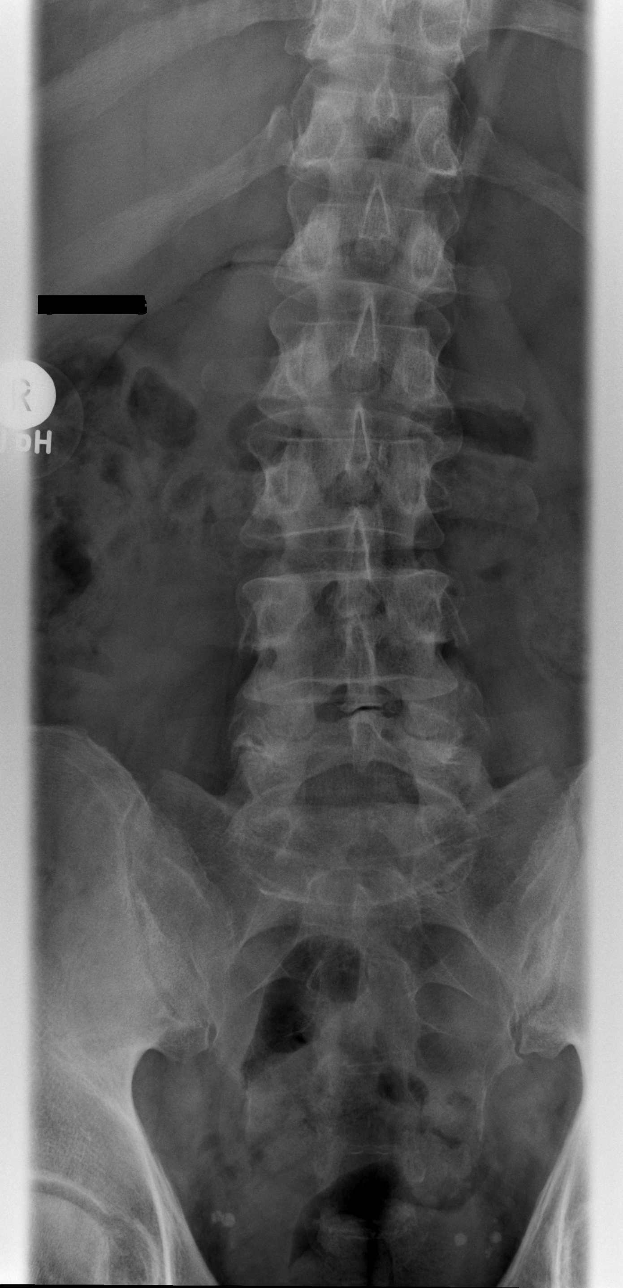

[w lumbar spine obl (1 of 2)]
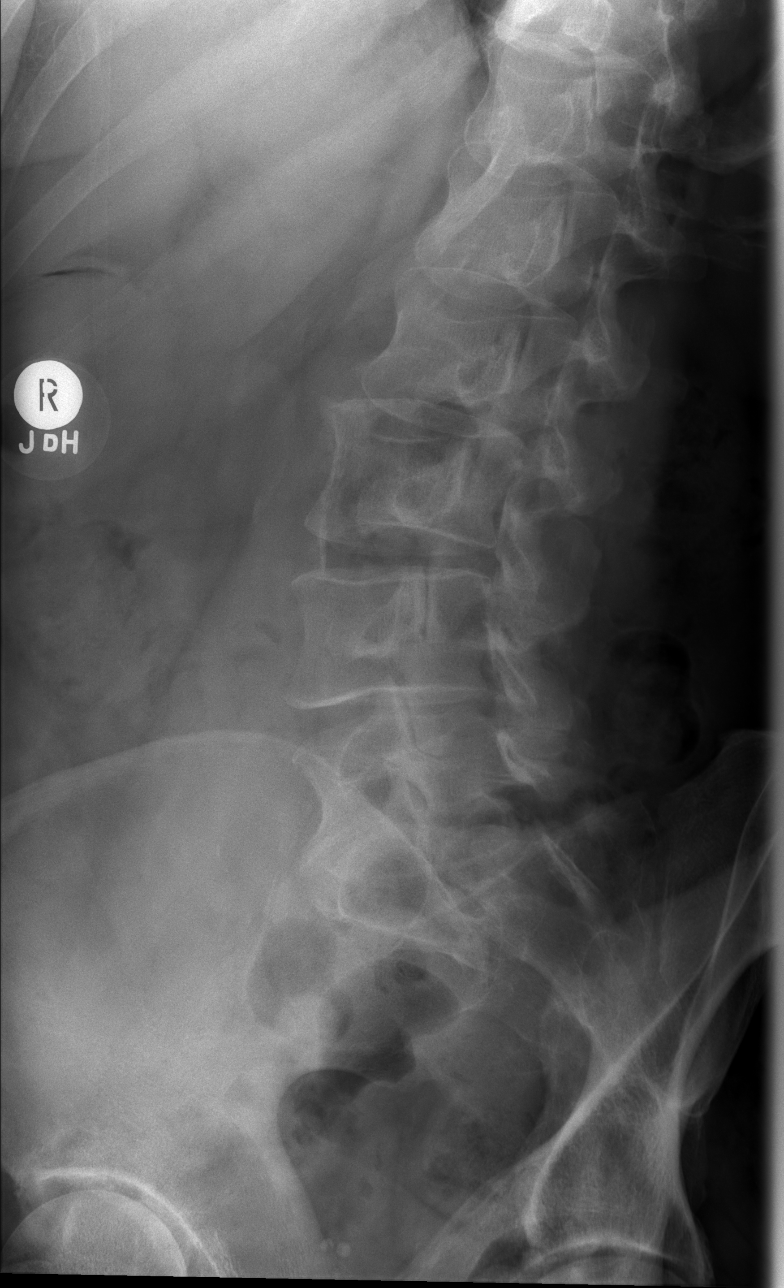

[w lumbar spine obl (2 of 2)]
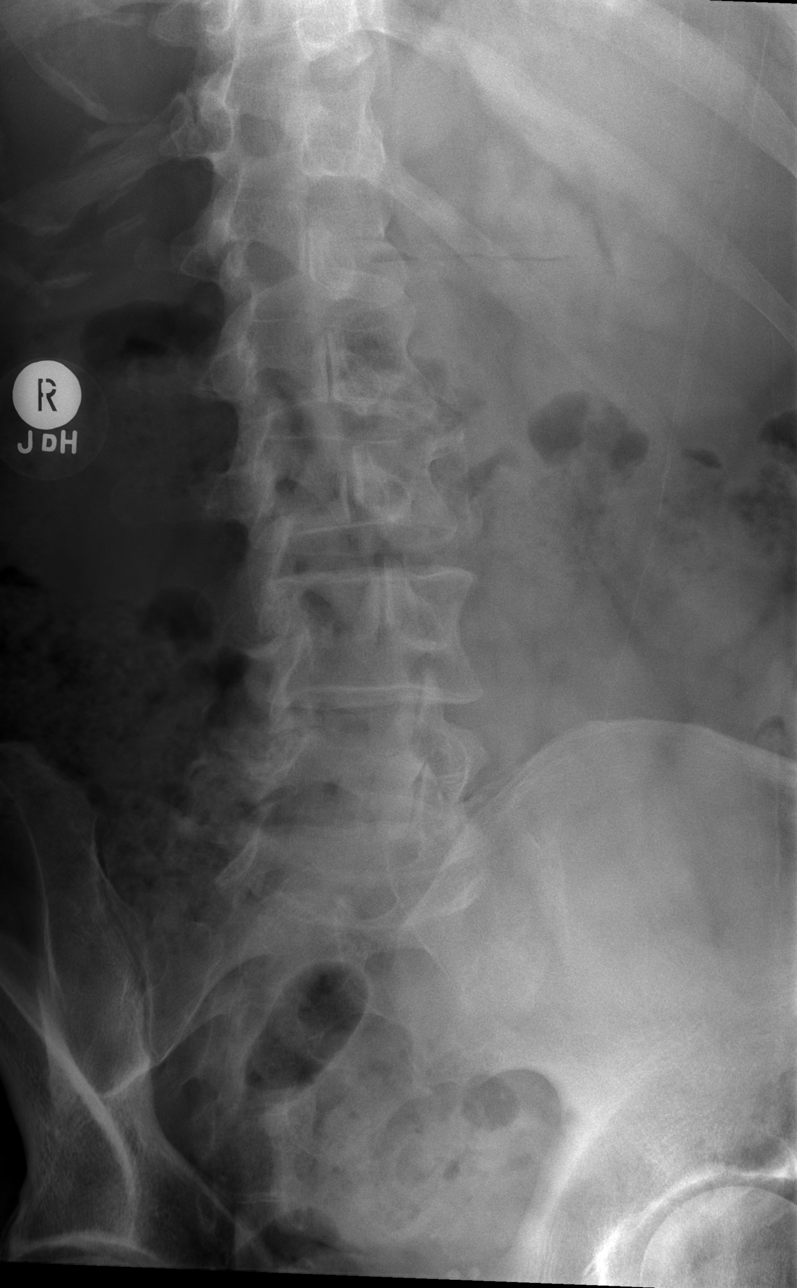

[w lumbar spine lat]
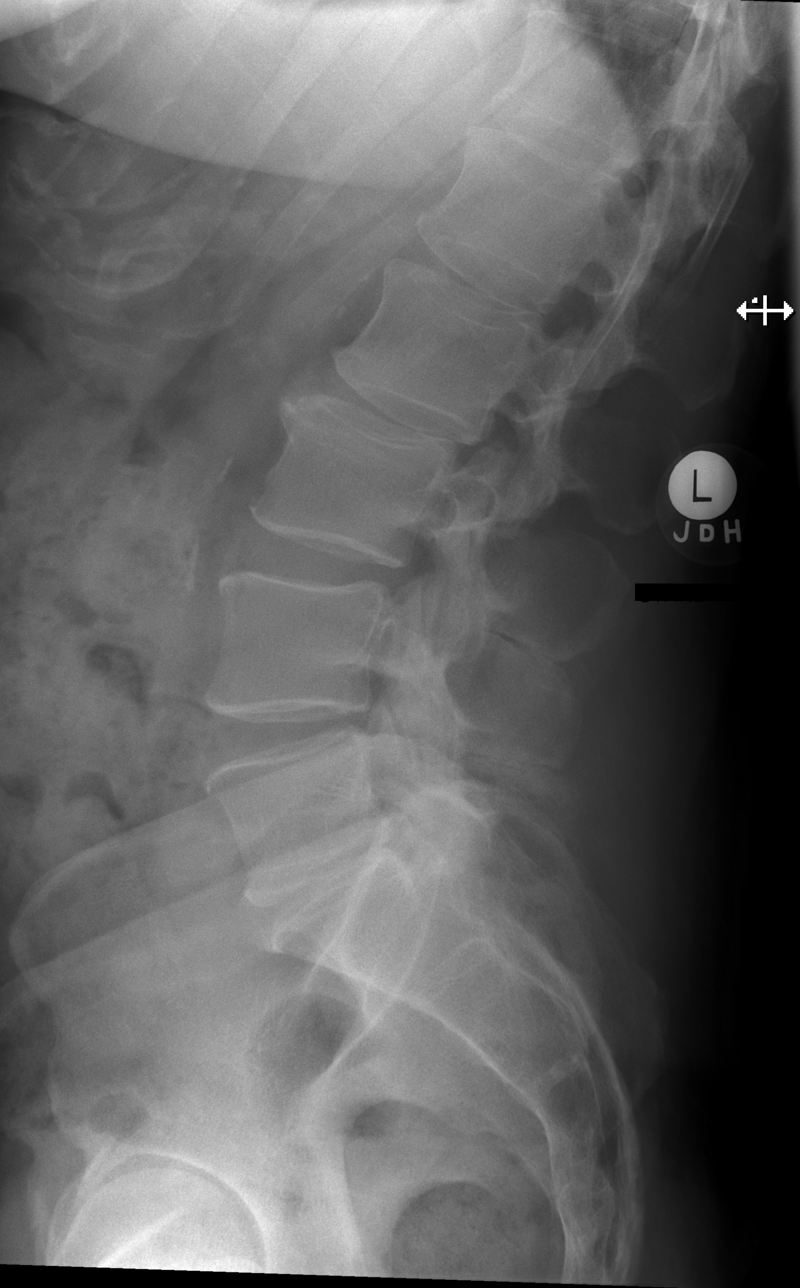

[w lumbar l-5 s-1 spot]
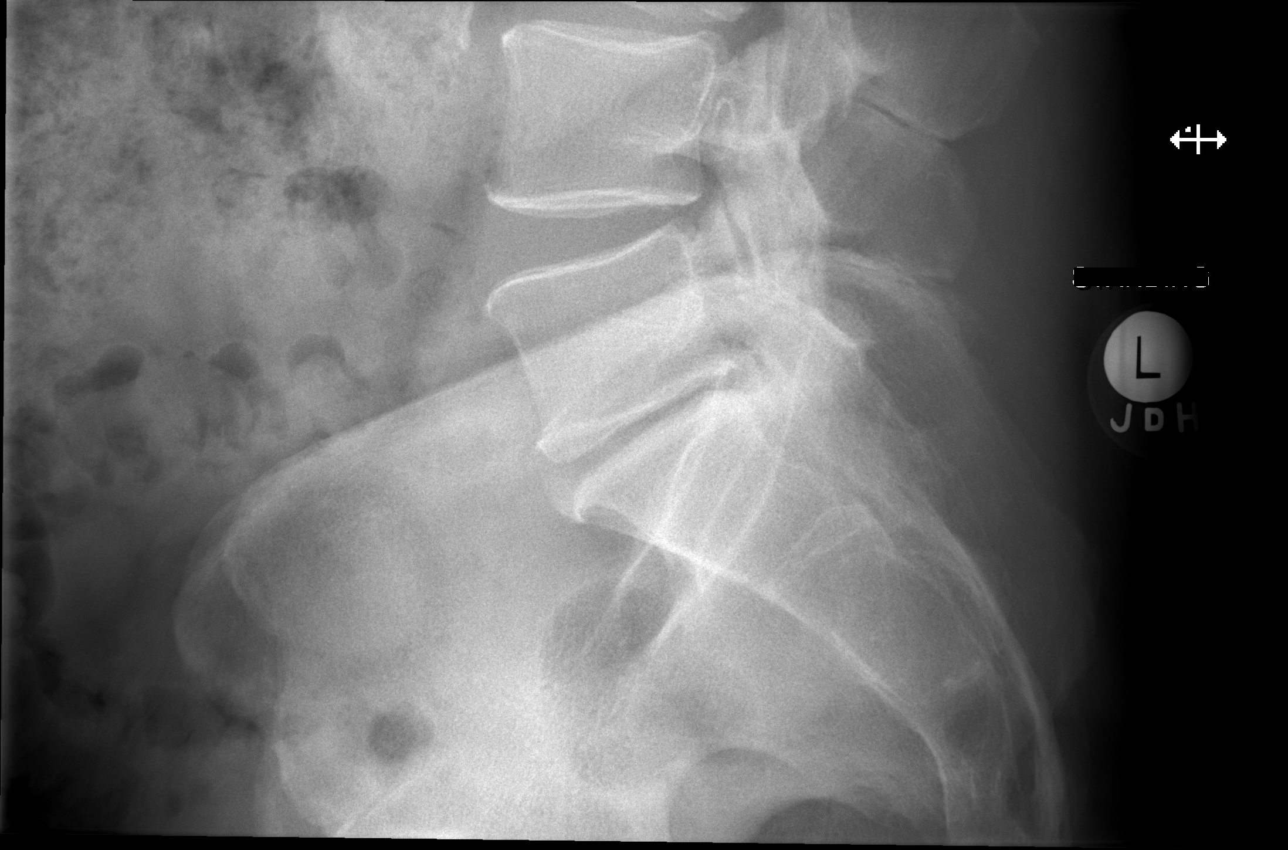

[5 of 5 positions shown; findings below may reference images not displayed]

FINDINGS: Five lumbar type vertebral bodies are well visualized. Vertebral
body height is well maintained. Mild disc space narrowing is noted
at L4-5 and to a greater degree at L5-S1. There is some suggestion
of mild retrolisthesis of L4 on L5 although this may be positional
in nature. Mild osteophytic changes are seen. No pars defects are
noted. No soft tissue abnormality is seen.
IMPRESSION: Degenerative change without acute abnormality.

## 2019-05-17 ENCOUNTER — Encounter: Payer: Self-pay | Admitting: Neurology

## 2019-05-17 ENCOUNTER — Other Ambulatory Visit: Payer: Self-pay

## 2019-05-17 ENCOUNTER — Ambulatory Visit: Payer: 59 | Admitting: Neurology

## 2019-05-17 VITALS — BP 153/100 | HR 70 | Temp 97.5°F | Ht 76.0 in | Wt 273.0 lb

## 2019-05-17 DIAGNOSIS — G4733 Obstructive sleep apnea (adult) (pediatric): Secondary | ICD-10-CM

## 2019-05-17 DIAGNOSIS — Z8719 Personal history of other diseases of the digestive system: Secondary | ICD-10-CM

## 2019-05-17 DIAGNOSIS — Z9989 Dependence on other enabling machines and devices: Secondary | ICD-10-CM

## 2019-05-17 DIAGNOSIS — E669 Obesity, unspecified: Secondary | ICD-10-CM | POA: Diagnosis not present

## 2019-05-17 DIAGNOSIS — Z87898 Personal history of other specified conditions: Secondary | ICD-10-CM

## 2019-05-17 NOTE — Progress Notes (Signed)
SLEEP MEDICINE CLINIC   Provider:  Larey Seat, M D  Referring Provider: Colon Branch, MD Primary Care Physician:  Colon Branch, MD  Chief Complaint  Patient presents with  . Follow-up    DME [facility]- pt alone, rm 10, concerns r/t dry mouth     Rv interval history , 05-17-2019, CLEM WISENBAKER is a 53 y.o. Male, seen here for a compliance visit CPAP-  His home sleep test in 2018  had confirmed the presence of obstructive sleep apnea but at a mild degree without associated oxygen desaturation, prolonged hypoxemia, there was a range of heart rate but indicated tachybradycardia with variability between 47 and 116 bpm.   His compliance is excellent- but he needs a better humidity level.   Patient has an excellent compliance 100% 4 days and hours of use.  Average user time is 7 hours and 25 minutes, the machine is an AutoSet air sense 10 machine by [employer] with a minimum pressure setting of 4 and a maximum pressure setting of 15 cmH2O full-time EPR is a expiratory pressure relief of 3 cmH2O.  The 95th percentile pressure is 10 cmH2O which is fully covered in the current settings, the residual AHI is 0.4 which is an excellent resolution.  There are no major air leaks.       JAZEN SPRAGGINS is a 53 y.o. Male, seen here for a compliance visit CPAP-today is 40 August 2019, and Mr. Conroy Goracke is here today with a wife I obtain download from his CPAP machine but 100% compliance!.  Average use of time per day is 7 hours 35 minutes, the machine is an AutoSet which operates in the pressure window between 4 and 15 cmH2O with 3 cm water expiratory pressure relief.  The residual AHI also known as sleep apnea hypercapnia index is 0.5/h and speaks of an excellent resolution of apnea there is very little air leak in the 95th percentile pressure is around 9.7 cm water. He likes his machine and FFM, and he can't sleep nearly as well without it. Epworth SS is 5/ 24 points and FSS at 20/ 63 points. No  depression.  He continues to work, mostly indoors, he drives.    Interval history from 05/12/2017, Mr. Jeanne Terrance underwent a home sleep test on 01/22/2017 it showed only mild sleep apnea but strictly obstructive in nature. The patient has been used to CPAP therapy and I ordered and auto- titration. The patient did not have prolonged hypoxemia so inferiorly he could also be a good 10 dental device candidate should he choose not to use CPAP in the future. The data downloads available show 30 out of 30 days of 100% of use CPAP for 7 hours and 35 minutes on average. The machine has a pressure range between 4 and 15 cm water with 3 cm EPR and his residual AHI is only 0.5/hr. The 95% pressure is 8.7cm. He is loosing weight,  He lost 25 pounds.    KYION GAUTIER is a 53 y.o. male , he was  seen here as a referral from Dr. Larose Kells for a sleep consultation on 12-15-2016 ,  12-15-2016 Mr. Cuccia has been using CPAP for a dozen years, and continues to be compliant. He does need new supplies and probably a new machine. He has a history of arthritis, chronic back pain, degenerative disc disease in the lumbosacral region, spondylosis and sleep apnea. 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 His only  regular taken medication is ibuprofen. Sleep habits are as follows: He has no trouble initiating sleep in his usual bedtime is around 9 PM. He prefers to sleep on his side, uses 3 pillows. His bedroom is dark, cool and quiet. His wife is sharing the bedroom and has not noted any sleep behaviors. He has been using CPAP compliantly, he does not snore while using CPAP. He has usually 2-3 bathroom breaks each night, but he is able to fall back asleep promptly. He rises in the morning at 5 AM, after an estimate of 7 hours of nocturnal sleep. He has developed morning headaches in the center of his forehead, he wakes now up with a dry mouth which wasn't the case a year ago. This may be well related to the failure to  humidify the CPAP and an ill fitting facial interface. He has always used a fullface mask. He was last given a fresh mask about a year ago. [facility].   Sleep medical history and family sleep history:  He strongly believes his father has obstructive sleep apnea but he has never been diagnosed or treated. He does not think his brother has apnea. His children also has not developed apnea. No family history of insomnia or restless legs.He was a sleep walker in childhood, no history of night terrors and no enuresis.  Social history:  Married , 2 children, non smoker- quit in his youth, non ETOH drinker , since 11-2009, caffeine - 2 cups in AM , no soda, rarely iced tea.   Review of Systems: Out of a complete 14 system review, the patient complains of only the following symptoms, and all other reviewed systems are negative. palpitation, some at night. Cardio work up , arranged for cardiac monitor.  How likely are you to doze in the following situations: 0 = not likely, 1 = slight chance, 2 = moderate chance, 3 = high chance  Sitting and Reading? Watching Television? Sitting inactive in a public place (theater or meeting)? Lying down in the afternoon when circumstances permit? Sitting and talking to someone? Sitting quietly after lunch without alcohol? In a car, while stopped for a few minutes in traffic? As a passenger in a car for an hour without a break?  Total =5/24   , Fatigue severity score 15  from 29  , depression score n/a    Social History   Socioeconomic History  . Marital status: Married    Spouse name: Not on file  . Number of children: 2  . Years of education: Not on file  . Highest education level: Not on file  Occupational History  . 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[employer]  Social Needs  . Financial resource strain: Not on file  . Food insecurity    Worry: Not on file    Inability: Not on file  . Transportation needs     Medical: Not on file    Non-medical: Not on file  Tobacco Use  . Smoking status: Former Research scientist (life sciences)  . Smokeless tobacco: Former Systems developer  . Tobacco comment: 1 ppd, quit 2003  Substance and Sexual Activity  . Alcohol use: No  . Drug use: No    Types: Hydrocodone  . Sexual activity: Yes  Lifestyle  . Physical activity    Days per week: Not on file    Minutes per session: Not on file  . Stress: Not on file  Relationships  . Social connections  Talks on phone: Not on file    Gets together: Not on file    Attends religious service: Not on file    Active member of club or organization: Not on file    Attends meetings of clubs or organizations: Not on file    Relationship status: Not on file  . Intimate partner violence    Fear of current or ex partner: Not on file    Emotionally abused: Not on file    Physically abused: Not on file    Forced sexual activity: Not on file  Other Topics Concern  . Not on file  Social History Narrative   Daughter: graduated from college   Son : graduated HS, works w/ father     Family History  Problem Relation Age of Onset  . Lung cancer Father   . Stroke Other        GM stroke 21  . Breast cancer Other        GM  . Arrhythmia Maternal Aunt   . Diabetes Neg Hx   . CAD Neg Hx   . Colon cancer Neg Hx   . Prostate cancer Neg Hx   . Esophageal cancer Neg Hx   . Rectal cancer Neg Hx   . Stomach cancer Neg Hx     Past Medical History:  Diagnosis Date  . Arthritis   . Chronic back pain    bilateral L5/S1 transforaminal lumbar epidural steroid injections, Dr. [name]  . DDD (degenerative disc disease), lumbosacral   . Lumbosacral spondylosis   . Sleep apnea    on CPAP, dx ~ 2008     Past Surgical History:  Procedure Laterality Date  . VASECTOMY      Current Outpatient Medications  Medication Sig Dispense Refill  . HYDROcodone-acetaminophen (NORCO) 10-325 MG per tablet Take 1 tablet by mouth as needed.    [name] ibuprofen (ADVIL) 200 MG tablet  Take 200 mg by mouth every 6 (six) hours as needed.    . Ibuprofen-diphenhydrAMINE Cit (ADVIL PM PO) Take 1 tablet by mouth at bedtime as needed.     No current facility-administered medications for this visit.     Allergies as of 05/17/2019  . (No Known Allergies)    Vitals: BP (!) 153/100   Pulse 70   Temp (!) 97.5 F (36.4 C)   Ht 6\' 4"  (1.93 m)   Wt 273 lb (123.8 kg)   BMI 33.23 kg/m  Last Weight:  Wt Readings from Last 1 Encounters:  05/17/19 273 lb (123.8 kg)   JYN:WGNF mass index is 33.23 kg/m.     Last Height:   Ht Readings from Last 1 Encounters:  05/17/19 6\' 4"  (1.93 m)    Physical exam:  General: The patient is awake, alert and appears not in acute distress.  Head: Normocephalic, atraumatic. Neck is supple. Mallampati 3 .  neck circumference remains at 18.5" . Nasal airflow patent,  Retrognathia is not seen, but a crowded dental status.  Cardiovascular:  Regular rate and rhythm . Respiratory: Lungs are clear to auscultation. Skin:  Without evidence of edema, or rash. Tanned,  Trunk: BMI is 30 down from 33. The patient's posture is erect . He has less knee pain.   Neurologic exam Mood and affect are appropriate.  Cranial nerves: no loss of smell or taste.   Pupils are equal and briskly reactive to light. Extraocular movements  in vertical and horizontal planes intact and without nystagmus. The patient reports fatigue of  vision, blurred vision in PM, not in AM>  Facial sensation intact to fine touch. Facial motor strength is symmetric , his tongue and uvula moved midline.  Shoulder shrug was symmetrical.  Good bilateral grip strength.  Sensory intact to fine touch and vibration. No finger numbness.  Coordination - no changes in handwriting,  has middle finger and ring finger arthritis, or trigger finger, dropping objects form the left hand only.  .  Motor exam:  Normal tone, muscle bulk and symmetric strength in all extremities.  His left grip is weaker,  though /    Deep tendon reflexes: in the  upper and lower extremities are symmetric and intact.    I spent  more than 20  minutes of face to face time with the patient. Greater than 50% of time was spent in counseling and coordination of care. We have discussed the diagnosis and differential and I answered the patient's questions.     Assessment:  After physical and neurologic examination, review of laboratory studies,  Personal review of imaging studies, reports of other /same  Imaging studies ,  Results of polysomnography/ neurophysiology testing and pre-existing records as far as provided in visit., my assessment is   1)  OSA -  He is doing excellent on his current AutoSet and no further changes have to be made. He will contact [facility] to see if he can change to a different mask. He prefers a full face mask model , looks for a larger size-Airtouch.  2) Obesity he lost a bit of weight down to  240 - he regained once the gym was closed.   3) HTN followed by Dr Larose Kells.   4) blurred vision - he needs a baseline ophthalmological evaluation, health of retina, lens and acuity.         He continues to be 100% compliant and use a setting between 4 and 15 cm water, 3 cm EPR, AHI was  0.4/h.   Asencion Partridge Dominik Lauricella MD  RV with NP or me in 12 month  .   05/17/2019   CC: Colon Branch, [address],  [place] [address]

## 2019-07-04 IMAGING — CR DG KNEE COMPLETE 4+V*R*
4 series · 4 of 4 positions shown · non-contrast
Comparison: None.

CLINICAL DATA: Chronic pain

EXAM:
RIGHT KNEE - COMPLETE 4+ VIEW

[w knee ap right]
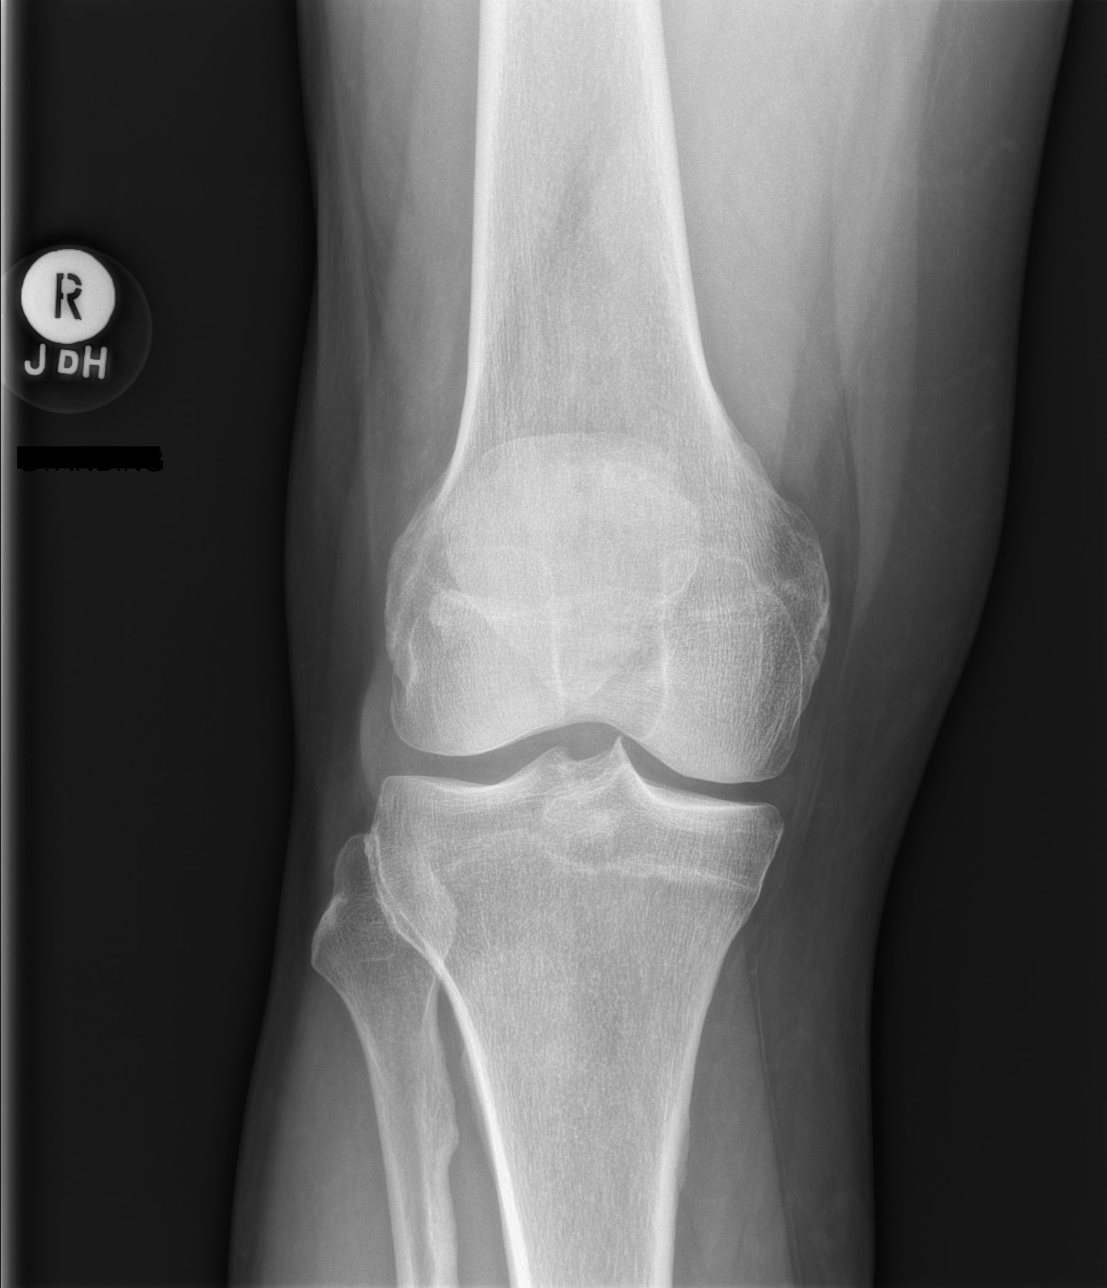

[w knee lat right]
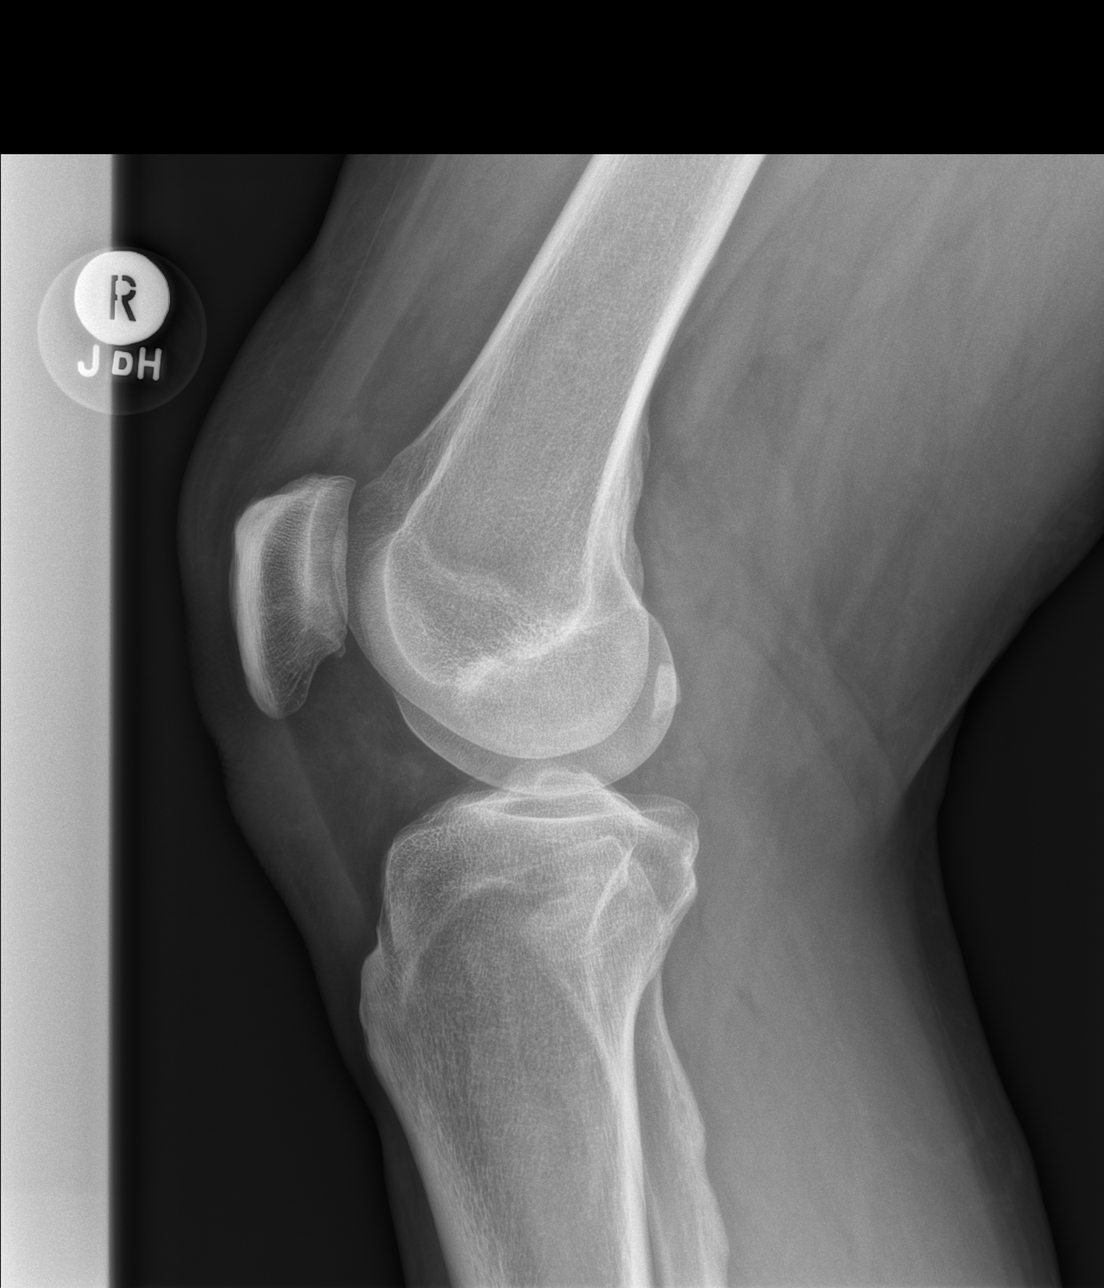

[x knee tunnel right]
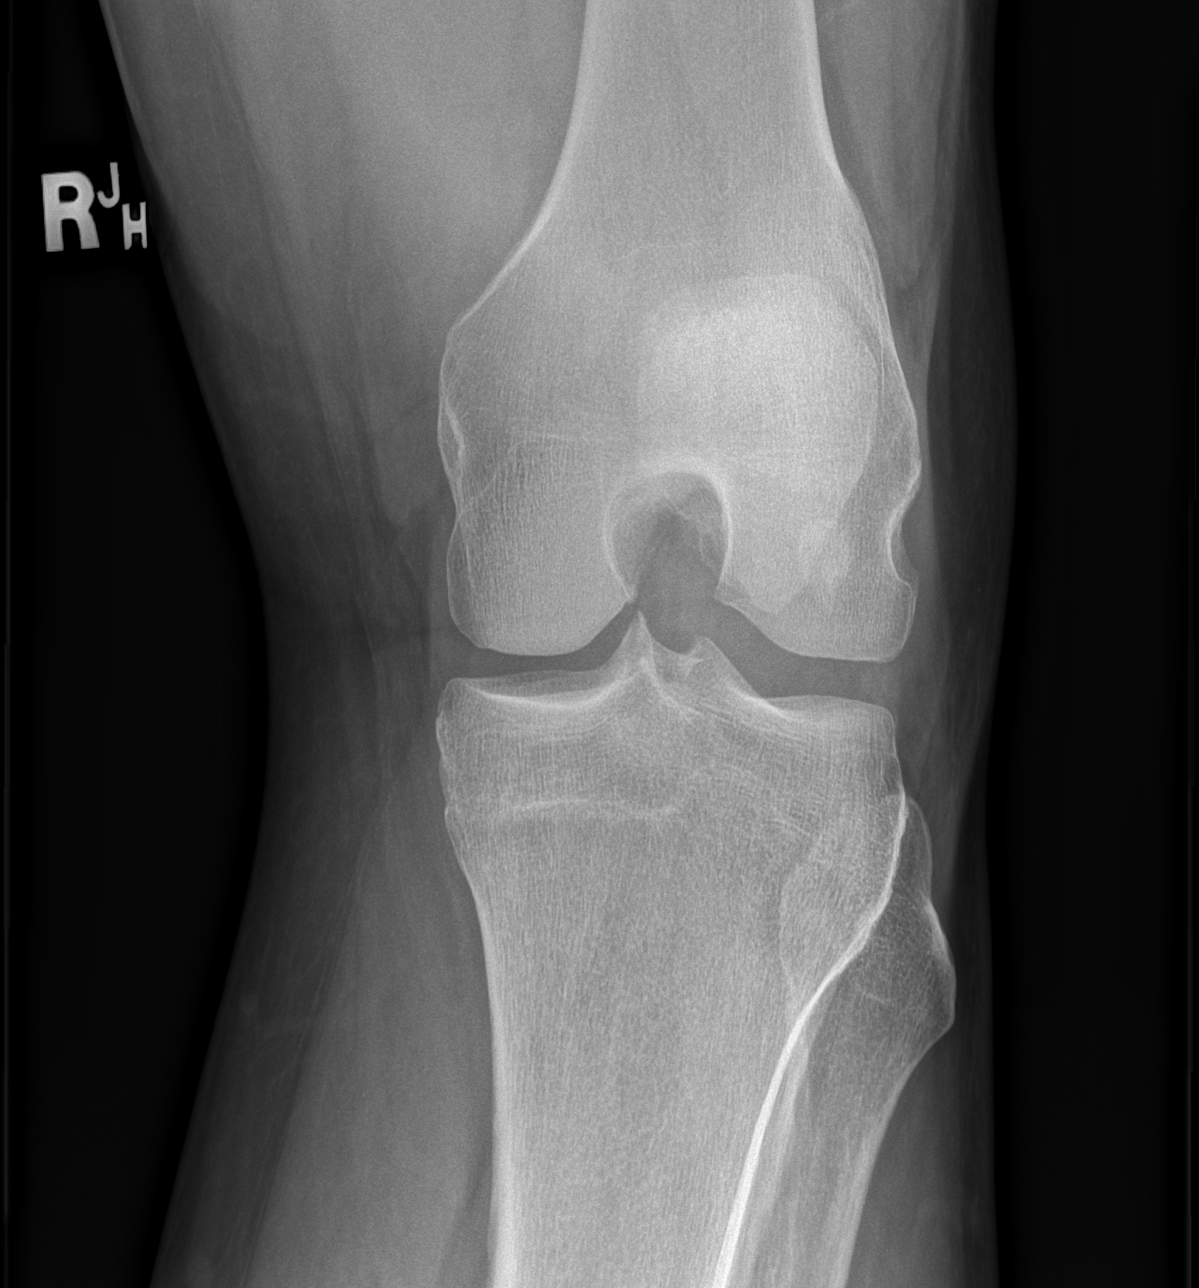

[x knee sunrise right]
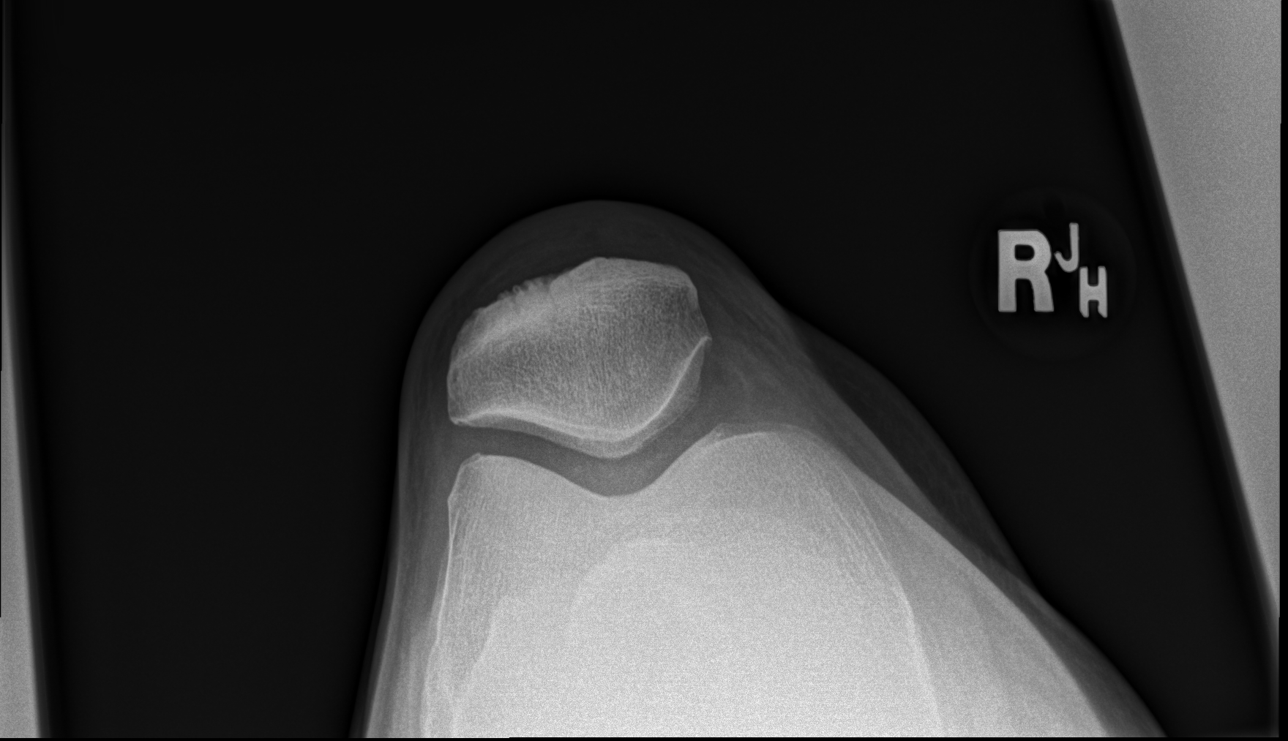

[4 of 4 positions shown; findings below may reference images not displayed]

FINDINGS: Standing frontal, standing lateral, tunnel, and sunrise patellar
images were obtained. There is no fracture or dislocation. No joint
effusion. The joint spaces appear normal. No erosive change.
IMPRESSION: No fracture or joint effusion.  No evident arthropathy.

## 2019-10-18 ENCOUNTER — Ambulatory Visit: Payer: 59 | Admitting: Internal Medicine

## 2019-10-18 ENCOUNTER — Other Ambulatory Visit (HOSPITAL_COMMUNITY)
Admission: RE | Admit: 2019-10-18 | Discharge: 2019-10-18 | Disposition: A | Discharge status: Home / Self Care | Payer: 59 | Source: Ambulatory Visit | Attending: Internal Medicine | Admitting: Internal Medicine

## 2019-10-18 ENCOUNTER — Other Ambulatory Visit: Payer: Self-pay

## 2019-10-18 ENCOUNTER — Encounter: Payer: Self-pay | Admitting: Internal Medicine

## 2019-10-18 VITALS — BP 153/85 | HR 68 | Temp 98.0°F | Resp 16 | Ht 76.0 in | Wt 281.4 lb

## 2019-10-18 DIAGNOSIS — F419 Anxiety disorder, unspecified: Secondary | ICD-10-CM

## 2019-10-18 DIAGNOSIS — Z23 Encounter for immunization: Secondary | ICD-10-CM | POA: Diagnosis not present

## 2019-10-18 DIAGNOSIS — A6001 Herpesviral infection of penis: Secondary | ICD-10-CM | POA: Diagnosis not present

## 2019-10-18 DIAGNOSIS — N485 Ulcer of penis: Secondary | ICD-10-CM

## 2019-10-18 MED ORDER — VALACYCLOVIR HCL 1 G PO TABS: 1000.0000 mg | ORAL_TABLET | Freq: Two times a day (BID) | ORAL | 0 refills | Status: DC

## 2019-10-18 NOTE — Patient Instructions (Addendum)
GO TO THE LAB : Get the blood work     Start taking Valtrex for 10 days  Call if the ulcers are not gradually better  See you in March  Check the  blood pressure 2 or 3 times a month week monthly weekly daily BP GOAL is between 110/65 and  135/85. If it is consistently higher or lower, let me know   HOW TO TAKE YOUR BLOOD PRESSURE:   Rest 5 minutes before taking your blood pressure.   Don't smoke or drink caffeinated beverages for at least 30 minutes before.   Take your blood pressure before (not after) you eat.   Sit comfortably with your back supported and both feet on the floor (don't cross your legs).   Elevate your arm to heart level on a table or a desk.   Use the proper sized cuff. It should fit smoothly and snugly around your bare upper arm. There should be enough room to slip a fingertip under the cuff. The bottom edge of the cuff should be 1 inch above the crease of the elbow.   Ideally, take 3 measurements at one sitting and record the average.     Genital Herpes Genital herpes is a common sexually transmitted infection (STI) that is caused by a virus. The virus spreads from person to person through sexual contact. Infection can cause itching, blisters, and sores around the genitals or rectum. Symptoms may last several days and then go away This is called an outbreak. However, the virus remains in your body, so you may have more outbreaks in the future. The time between outbreaks varies and can be months or years. Genital herpes affects men and women. It is particularly concerning for pregnant women because the virus can be passed to the baby during delivery and can cause serious problems. Genital herpes is also a concern for people who have a weak disease-fighting (immune) system. What are the causes? This condition is caused by the herpes simplex virus (HSV) type 1 or type 2. The virus may spread through:  Sexual contact with an infected person, including vaginal,  anal, and oral sex.  Contact with fluid from a herpes sore.  The skin. This means that you can get herpes from an infected partner even if he or she does not have a visible sore or does not know that he or she is infected. What increases the risk? You are more likely to develop this condition if:  You have sex with many partners.  You do not use latex condoms during sex. What are the signs or symptoms? Most people do not have symptoms (asymptomatic) or have mild symptoms that may be mistaken for other skin problems. Symptoms may include:  Small red bumps near the genitals, rectum, or mouth. These bumps turn into blisters and then turn into sores.  Flu-like symptoms, including: ? Fever. ? Body aches. ? Swollen lymph nodes. ? Headache.  Painful urination.  Pain and itching in the genital area or rectal area.  Vaginal discharge.  Tingling or shooting pain in the legs and buttocks. Generally, symptoms are more severe and last longer during the first (primary) outbreak. Flu-like symptoms are also more common during the primary outbreak. How is this diagnosed? Genital herpes may be diagnosed based on:  A physical exam.  Your medical history.  Blood tests.  A test of a fluid sample (culture) from an open sore. How is this treated? There is no cure for this condition, but treatment with  antiviral medicines that are taken by mouth (orally) can do the following:  Speed up healing and relieve symptoms.  Help to reduce the spread of the virus to sexual partners.  Limit the chance of future outbreaks, or make future outbreaks shorter.  Lessen symptoms of future outbreaks. Your health care provider may also recommend pain relief medicines, such as aspirin or ibuprofen. Follow these instructions at home: Sexual activity  Do not have sexual contact during active outbreaks.  Practice safe sex. Latex condoms and male condoms may help prevent the spread of the herpes virus.  General instructions  Keep the affected areas dry and clean.  Take over-the-counter and prescription medicines only as told by your health care provider.  Avoid rubbing or touching blisters and sores. If you do touch blisters or sores: ? Wash your hands thoroughly with soap and water. ? Do not touch your eyes afterward.  To help relieve pain or itching, you may take the following actions as directed by your health care provider: ? Apply a cold, wet cloth (cold compress) to affected areas 4-6 times a day. ? Apply a substance that protects your skin and reduces bleeding (astringent). ? Apply a gel that helps relieve pain around sores (lidocaine gel). ? Take a warm, shallow bath that cleans the genital area (sitz bath).  Keep all follow-up visits as told by your health care provider. This is important. How is this prevented?  Use condoms. Although anyone can get genital herpes during sexual contact, even with the use of a condom, a condom can provide some protection.  Avoid having multiple sexual partners.  Talk with your sexual partner about any symptoms either of you may have. Also, talk with your partner about any history of STIs.  Get tested for STIs before you have sex. Ask your partner to do the same.  Do not have sexual contact if you have symptoms of genital herpes. Contact a health care provider if:  Your symptoms are not improving with medicine.  Your symptoms return.  You have new symptoms.  You have a fever.  You have abdominal pain.  You have redness, swelling, or pain in your eye.  You notice new sores on other parts of your body.  You are a woman and experience bleeding between menstrual periods.  You have had herpes and you become pregnant or plan to become pregnant. Summary  Genital herpes is a common sexually transmitted infection (STI) that is caused by the herpes simplex virus (HSV) type 1 or type 2.  These viruses are most often spread through  sexual contact with an infected person.  You are more likely to develop this condition if you have sex with many partners or you have unprotected sex.  Most people do not have symptoms (asymptomatic) or have mild symptoms that may be mistaken for other skin problems. Symptoms occur as outbreaks that may happen months or years apart.  There is no cure for this condition, but treatment with oral antiviral medicines can reduce symptoms, reduce the chance of spreading the virus to a partner, prevent future outbreaks, or shorten future outbreaks. This information is not intended to replace advice given to you by your health care provider. Make sure you discuss any questions you have with your health care provider. Document Revised: 03/20/2018 Document Reviewed: 08/13/2016 Elsevier Patient Education  [place].

## 2019-10-18 NOTE — Progress Notes (Signed)
Pre visit review using our clinic review tool, if applicable. No additional management support is needed unless otherwise documented below in the visit note. 

## 2019-10-18 NOTE — Progress Notes (Signed)
   Subjective:    Patient ID: Dylan Hatfield, male    DOB: July 24, 1966, 54 y.o.   MRN: [id number]  DOS:  10/18/2019 Type of visit - description: Acute A week ago developed 1 blister at the penis, subsequently he developed 3 more. They are painful, not itching. He denies dysuria, gross hematuria, difficulty urinating or urinary frequency.  He is married for 20 years and has been faithful and so has his wife however the issue has created a lot of stress in the relationship..  Review of Systems See above   Past Medical History:  Diagnosis Date  . Arthritis   . Chronic back pain    bilateral L5/S1 transforaminal lumbar epidural steroid injections, Dr. [name]  . DDD (degenerative disc disease), lumbosacral   . Lumbosacral spondylosis   . Sleep apnea    on CPAP, dx ~ 2008     Past Surgical History:  Procedure Laterality Date  . VASECTOMY          Objective:   Physical Exam BP (!) 153/85 (BP Location: Left Arm, Patient Position: Sitting, Cuff Size: Normal)   Pulse 68   Temp 98 F (36.7 C) (Temporal)   Resp 16   Ht 6\' 4"  (1.93 m)   Wt 281 lb 6 oz (127.6 kg)   SpO2 100%   BMI 34.25 kg/m  General:   Well developed, NAD, BMI noted. HEENT:  Normocephalic . Face symmetric, atraumatic GU: Scrotal contents normal Penis: He has 4 shallow ulcers, they are TTP, the biggest one is about 2 mm and located near the glans on the right dorsum. Skin: Not pale. Not jaundice Neurologic:  alert & oriented X3.  Speech normal, gait appropriate for age and unassisted Psych--  Cognition and judgment appear intact.  Cooperative with normal attention span and concentration.  Behavior appropriate. No anxious or depressed appearing.      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 We had a long discussion about the issue, it will be very hard to know when or who past herpes to him. Explained that this could be the first and only episode  or he could have morer episodes.. I educated the patient: he is contagious as long as he has open sores. His wife is going to the gynecologist today to be checked. Plan:  Valtrex Check: HIV, RPR, GNC.  Repeat RPR on RTC Call if ulcers are not gradually better they become recurrent Anxiety: Counseled, reassured to the best of my ability, this issue has put some strain on his marriage. Preventive care: He agreed to get the flu shot today, also, Covid vaccination is strongly recommended  35 minutes   This visit occurred during the SARS-CoV-2 public health emergency.  Safety protocols were in place, including screening questions prior to the visit, additional usage of staff PPE, and extensive cleaning of exam room while observing appropriate contact time as indicated for disinfecting solutions.

## 2019-10-19 LAB — URINE CYTOLOGY ANCILLARY ONLY
Chlamydia: NEGATIVE
Comment: NEGATIVE
Comment: NORMAL
Neisseria Gonorrhea: NEGATIVE

## 2019-10-19 NOTE — Assessment & Plan Note (Signed)
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 We had a long discussion about the issue, it will be very hard to know when or who past herpes to him. Explained that this could be the first and only episode  or he could have morer episodes.. I educated the patient: he is contagious as long as he has open sores. His wife is going to the gynecologist today to be checked. Plan:  Valtrex Check: HIV, RPR, GNC.  Repeat RPR on RTC Call if ulcers are not gradually better they become recurrent Anxiety: Counseled, reassured to the best of my ability, this issue has put some strain on his marriage. Preventive care: He agreed to get the flu shot today, also, Covid vaccination is strongly recommended

## 2019-10-23 ENCOUNTER — Encounter: Payer: Self-pay | Admitting: Internal Medicine

## 2019-10-25 LAB — HSV(HERPES SIMPLEX VRS) I + II AB-IGG
HAV 1 IGG,TYPE SPECIFIC AB: <0.9 index
HSV 2 IGG,TYPE SPECIFIC AB: <0.9 index

## 2019-10-25 LAB — HIV ANTIBODY (ROUTINE TESTING W REFLEX): HIV 1&2 Ab, 4th Generation: NONREACTIVE

## 2019-10-25 LAB — RPR: RPR Ser Ql: NONREACTIVE

## 2019-10-25 LAB — TEST AUTHORIZATION

## 2019-11-24 ENCOUNTER — Encounter: Payer: Self-pay | Admitting: Internal Medicine

## 2019-11-26 ENCOUNTER — Other Ambulatory Visit: Payer: Self-pay | Admitting: Internal Medicine

## 2019-11-26 MED ORDER — VALACYCLOVIR HCL 1 G PO TABS
1000.0000 mg | ORAL_TABLET | Freq: Two times a day (BID) | ORAL | 0 refills | Status: DC
Start: 2019-11-26 — End: 2019-12-18

## 2019-12-18 ENCOUNTER — Other Ambulatory Visit: Payer: Self-pay

## 2019-12-18 ENCOUNTER — Ambulatory Visit (INDEPENDENT_AMBULATORY_CARE_PROVIDER_SITE_OTHER): Payer: 59 | Admitting: Internal Medicine

## 2019-12-18 ENCOUNTER — Telehealth: Payer: Self-pay

## 2019-12-18 ENCOUNTER — Encounter: Payer: Self-pay | Admitting: Internal Medicine

## 2019-12-18 VITALS — BP 132/67 | HR 57 | Temp 97.3°F | Resp 18 | Ht 76.0 in | Wt 267.0 lb

## 2019-12-18 DIAGNOSIS — R399 Unspecified symptoms and signs involving the genitourinary system: Secondary | ICD-10-CM | POA: Diagnosis not present

## 2019-12-18 DIAGNOSIS — B009 Herpesviral infection, unspecified: Secondary | ICD-10-CM

## 2019-12-18 DIAGNOSIS — Z0000 Encounter for general adult medical examination without abnormal findings: Secondary | ICD-10-CM | POA: Diagnosis not present

## 2019-12-18 LAB — PSA: PSA: 1.07 ng/mL (ref 0.10–4.00)

## 2019-12-18 LAB — CBC WITH DIFFERENTIAL/PLATELET
Basophils Absolute: 0 10*3/uL (ref 0.0–0.1)
Basophils Relative: 0.2 % (ref 0.0–3.0)
Eosinophils Absolute: 0.1 10*3/uL (ref 0.0–0.7)
Eosinophils Relative: 1.5 % (ref 0.0–5.0)
HCT: 48.4 % (ref 39.0–52.0)
Hemoglobin: 16.3 g/dL (ref 13.0–17.0)
Lymphocytes Relative: 23.9 % (ref 12.0–46.0)
Lymphs Abs: 1.4 10*3/uL (ref 0.7–4.0)
MCHC: 33.8 g/dL (ref 30.0–36.0)
MCV: 89.2 fl (ref 78.0–100.0)
Monocytes Absolute: 0.4 10*3/uL (ref 0.1–1.0)
Monocytes Relative: 6 % (ref 3.0–12.0)
Neutro Abs: 4.1 10*3/uL (ref 1.4–7.7)
Neutrophils Relative %: 68.4 % (ref 43.0–77.0)
Platelets: 196 10*3/uL (ref 150.0–400.0)
RBC: 5.42 Mil/uL (ref 4.22–5.81)
RDW: 14.4 % (ref 11.5–15.5)
WBC: 5.9 10*3/uL (ref 4.0–10.5)

## 2019-12-18 LAB — COMPREHENSIVE METABOLIC PANEL
ALT: 27 U/L (ref 0–53)
AST: 19 U/L (ref 0–37)
Albumin: 4.7 g/dL (ref 3.5–5.2)
Alkaline Phosphatase: 47 U/L (ref 39–117)
BUN: 21 mg/dL (ref 6–23)
CO2: 29 mEq/L (ref 19–32)
Calcium: 9.8 mg/dL (ref 8.4–10.5)
Chloride: 102 mEq/L (ref 96–112)
Creatinine, Ser: 0.95 mg/dL (ref 0.40–1.50)
GFR: 82.46 mL/min (ref >60.00)
Glucose, Bld: 105 mg/dL — ABNORMAL HIGH (ref 70–99)
Potassium: 4.7 mEq/L (ref 3.5–5.1)
Sodium: 138 mEq/L (ref 135–145)
Total Bilirubin: 0.9 mg/dL (ref 0.2–1.2)
Total Protein: 6.5 g/dL (ref 6.0–8.3)

## 2019-12-18 LAB — URINALYSIS, ROUTINE W REFLEX MICROSCOPIC
Bilirubin Urine: NEGATIVE
Hgb urine dipstick: NEGATIVE
Ketones, ur: NEGATIVE
Leukocytes,Ua: NEGATIVE
Nitrite: NEGATIVE
RBC / HPF: NONE SEEN (ref 0–?)
Specific Gravity, Urine: 1.01 (ref 1.000–1.030)
Total Protein, Urine: NEGATIVE
Urine Glucose: NEGATIVE
Urobilinogen, UA: 0.2 (ref 0.0–1.0)
WBC, UA: NONE SEEN (ref 0–?)
pH: 7.5 (ref 5.0–8.0)

## 2019-12-18 LAB — HEMOGLOBIN A1C: Hgb A1c MFr Bld: 5 % (ref 4.6–6.5)

## 2019-12-18 LAB — LIPID PANEL
Cholesterol: 169 mg/dL (ref 0–200)
HDL: 29.4 mg/dL — ABNORMAL LOW (ref >39.00)
LDL Cholesterol: 113 mg/dL — ABNORMAL HIGH (ref 0–99)
NonHDL: 140.09
Total CHOL/HDL Ratio: 6
Triglycerides: 137 mg/dL (ref 0.0–149.0)
VLDL: 27.4 mg/dL (ref 0.0–40.0)

## 2019-12-18 MED ORDER — TAMSULOSIN HCL 0.4 MG PO CAPS: 0.4000 mg | ORAL_CAPSULE | Freq: Every day | ORAL | 6 refills | Status: DC

## 2019-12-18 MED ORDER — ACYCLOVIR 400 MG PO TABS: ORAL_TABLET | ORAL | 6 refills | Status: DC

## 2019-12-18 MED ORDER — VALACYCLOVIR HCL 1 G PO TABS
1000.0000 mg | ORAL_TABLET | Freq: Every day | ORAL | 2 refills | Status: DC
Start: 2019-12-18 — End: 2019-12-19

## 2019-12-18 MED ORDER — ACYCLOVIR 5 % EX OINT: 1.0000 "application " | TOPICAL_OINTMENT | Freq: Four times a day (QID) | CUTANEOUS | 3 refills | Status: DC | PRN

## 2019-12-18 NOTE — Progress Notes (Signed)
Pre visit review using our clinic review tool, if applicable. No additional management support is needed unless otherwise documented below in the visit note. 

## 2019-12-18 NOTE — Assessment & Plan Note (Signed)
-  Td 2020    CCS: Colonoscopy 01/2017, + polyps, 5 years Prostate cancer screening:   On testosterone, increase LUTS lately, DRE negative.  Checking PSA, UA urine culture Labs:  CMP, FLP, CBC, A1c (mild hyperglycemia), PSA, UA, urine culture, herpes serology. Diet, exercise: Reports he is doing better. RTC 4 months

## 2019-12-18 NOTE — Telephone Encounter (Signed)
Rx sent 

## 2019-12-18 NOTE — Progress Notes (Signed)
Subjective:    Patient ID: Dylan Hatfield, male    DOB: [birth year], 54 y.o.   MRN: [id number]  DOS:  12/18/2019 Type of visit - description: CPX We also discussed a number of other issues. L UTS: Increased lately, + nocturia and urinary frequency.  Due to a new diet, he is drinking lots of fluids and he wonders about that being the cause.  See next. Started testosterone pellets recently.  Overall has a good sense of wellbeing. Requests Valtrex refill   Review of Systems  Other than above, a 14 point review of systems is negative      Past Medical History:  Diagnosis Date  . Arthritis   . Chronic back pain    bilateral L5/S1 transforaminal lumbar epidural steroid injections, Dr. [name]  . DDD (degenerative disc disease), lumbosacral   . Lumbosacral spondylosis   . Sleep apnea    on CPAP, dx ~ 2008     Past Surgical History:  Procedure Laterality Date  . VASECTOMY     Family History  Problem Relation Age of Onset  . Lung cancer Father   . Stroke Other        GM stroke 9  . Breast cancer Other        GM  . Arrhythmia Maternal Aunt   . Diabetes Neg Hx   . CAD Neg Hx   . Colon cancer Neg Hx   . Prostate cancer Neg Hx   . Esophageal cancer Neg Hx   . Rectal cancer Neg Hx   . Stomach cancer Neg Hx     Allergies as of 12/18/2019   No Known Allergies     Medication List       Accurate as of December 18, 2019  6:32 PM. If you have any questions, ask your nurse or doctor.        acyclovir 400 MG tablet Commonly known as: Zovirax Take 1 tablet by mouth three times daily as needed. For 5 days per episode. Started by: Bishop Dublin, CMA   acyclovir ointment 5 % Commonly known as: Zovirax Apply 1 application topically 4 (four) times daily as needed. Started by: Kathlene November, MD   Advil 200 MG tablet Generic drug: ibuprofen Take 200 mg by mouth every 6 (six) hours as needed.   ADVIL PM PO Take 1 tablet by mouth at bedtime as needed.   HYDROcodone-acetaminophen  10-325 MG tablet Commonly known as: NORCO Take 1 tablet by mouth as needed.   tamsulosin 0.4 MG Caps capsule Commonly known as: FLOMAX Take 1 capsule (0.4 mg total) by mouth daily after supper. Started by: Kathlene November, MD   TESTOSTERONE NA Place into the nose.   valACYclovir 1000 MG tablet Commonly known as: Valtrex Take 1 tablet (1,000 mg total) by mouth daily. For 5 days as needed What changed:   when to take this  additional instructions Changed by: Kathlene November, MD          Objective:   Physical Exam BP 132/67 (BP Location: Left Arm, Patient Position: Sitting, Cuff Size: Normal)   Pulse (!) 57   Temp (!) 97.3 F (36.3 C) (Temporal)   Resp 18   Ht 6\' 4"  (1.93 m)   Wt 267 lb (121.1 kg)   SpO2 100%   BMI 32.50 kg/m  General: Well developed, NAD, BMI noted Neck: No  thyromegaly  HEENT:  Normocephalic . Face symmetric, atraumatic Lungs:  CTA B Normal respiratory effort, no intercostal retractions,  no accessory muscle use. Heart: RRR,  no murmur.  Abdomen:  Not distended, soft, non-tender. No rebound or rigidity.   Lower extremities: no pretibial edema bilaterally DRE: Normal sphincter tone, brown stools, prostate normal Skin: Exposed areas without rash. Not pale. Not jaundice Neurologic:  alert & oriented X3.  Speech normal, gait appropriate for age and unassisted Strength symmetric and appropriate for age.  Psych: Cognition and judgment appear intact.  Cooperative with normal attention span and concentration.  Behavior appropriate. No anxious or depressed appearing.     Assessment      Assessment  OSA   Dx 2008, on CPAP MSK: DJD Trigger fingers  Chronic back pain-- sees pain mngmt  LUTS (nocturia x years, flomax caused retrograde ejaculation) Bony mass deformity of the chest wall, XR 06-2014 -- benign changes Lipoma: At the mid back, diameter 10 cm    Palpitations: saw cards (echo 2018) On testosterone (as of 11-2019, prescribed elsewhere)  PLAN       Here for CPX OSA: Reports good CPAP compliance HRT, on testosterone: Prescribed another practice, [facility]. LUTS: Recent increase of L UTS symptoms mostly nocturia.  Could be related to increase fluid intakes due to a new diet but also due to HRT.  DRE is benign, he likes to try Flomax although in the past it caused retrograde ejaculation.  Rx sent, once daily but the patient would like to take every other day. HSV: Will be on Valtrex as needed, acyclovir cream as needed per patient request.  Will recheck his serologies as previously agreed Elevated BP: Recommend to monitor BPs, see AVS RTC 4  Months  Addition to CPX we discussed other issues including the fact that he is taking hormones, LUTS, HSV and a slightly elevated BP.  Discussed with patient the possibility that HRT is increasing LUTS..  This visit occurred during the SARS-CoV-2 public health emergency.  Safety protocols were in place, including screening questions prior to the visit, additional usage of staff PPE, and extensive cleaning of exam room while observing appropriate contact time as indicated for disinfecting solutions.

## 2019-12-18 NOTE — Telephone Encounter (Signed)
Changed to acyclovir 400 mg:  3 times daily for 5 days per each episode of herpes. #30 and 6 refills

## 2019-12-18 NOTE — Assessment & Plan Note (Addendum)
  Here for CPX OSA: Reports good CPAP compliance HRT, on testosterone: Prescribed another practice, [facility]. LUTS: Recent increase of L UTS symptoms mostly nocturia.  Could be related to increase fluid intakes due to a new diet but also due to HRT.  DRE is benign, he likes to try Flomax although in the past it caused retrograde ejaculation.  Rx sent, once daily but the patient would like to take every other day. HSV: Will be on Valtrex as needed, acyclovir cream as needed per patient request.  Will recheck his serologies as previously agreed Elevated BP: Recommend to monitor BPs, see AVS RTC 4  Months

## 2019-12-18 NOTE — Telephone Encounter (Signed)
Valacyclovir 1gm not covered- Preferred: acyclovir tablets. Please advise.

## 2019-12-18 NOTE — Patient Instructions (Addendum)
Take Flomax every other day  Herpes: Take Valtrex 1 g daily for 5 days for each episode Use the cream 5 times daily for 5 days   Check the  blood pressure weekly BP GOAL is between 110/65 and  135/85. If it is consistently higher or lower, let me know    GO TO THE LAB : Get the blood work     [name], please reschedule your appointments Come back for   a checkup in 4 months

## 2019-12-19 ENCOUNTER — Telehealth: Payer: Self-pay

## 2019-12-19 ENCOUNTER — Other Ambulatory Visit: Payer: Self-pay

## 2019-12-19 LAB — URINE CULTURE
MICRO NUMBER:: 10282261
Result:: NO GROWTH
SPECIMEN QUALITY:: ADEQUATE

## 2019-12-19 LAB — HSV(HERPES SIMPLEX VRS) I + II AB-IGG
HAV 1 IGG,TYPE SPECIFIC AB: <0.9 index
HSV 2 IGG,TYPE SPECIFIC AB: 12.9 index — ABNORMAL HIGH

## 2019-12-19 NOTE — Telephone Encounter (Signed)
PA initiated via Covermymeds; [id number]. Awaiting determination.

## 2019-12-20 LAB — HSV(HERPES SIMPLEX VRS) I + II AB-IGM: HSVI/II Comb IgM: 1.43 Ratio — ABNORMAL HIGH (ref 0.00–0.90)

## 2019-12-21 NOTE — Telephone Encounter (Signed)
PA denied for 30g per month, however Pt's plan did approve 15g per prescription through 12/20/2020.

## 2019-12-25 ENCOUNTER — Encounter: Payer: Self-pay | Admitting: Internal Medicine

## 2019-12-25 ENCOUNTER — Other Ambulatory Visit: Payer: Self-pay | Admitting: Internal Medicine

## 2019-12-25 MED ORDER — ACYCLOVIR 400 MG PO TABS: 400.0000 mg | ORAL_TABLET | Freq: Two times a day (BID) | ORAL | 6 refills | Status: DC

## 2020-04-17 ENCOUNTER — Ambulatory Visit: Payer: 59 | Admitting: Internal Medicine

## 2020-04-17 ENCOUNTER — Encounter: Payer: Self-pay | Admitting: Internal Medicine

## 2020-04-17 ENCOUNTER — Other Ambulatory Visit: Payer: Self-pay

## 2020-04-17 VITALS — BP 128/82 | HR 63 | Temp 98.0°F | Resp 18 | Ht 76.0 in | Wt 253.1 lb

## 2020-04-17 DIAGNOSIS — R399 Unspecified symptoms and signs involving the genitourinary system: Secondary | ICD-10-CM

## 2020-04-17 DIAGNOSIS — M8949 Other hypertrophic osteoarthropathy, multiple sites: Secondary | ICD-10-CM | POA: Diagnosis not present

## 2020-04-17 DIAGNOSIS — B351 Tinea unguium: Secondary | ICD-10-CM

## 2020-04-17 DIAGNOSIS — M159 Polyosteoarthritis, unspecified: Secondary | ICD-10-CM

## 2020-04-17 NOTE — Patient Instructions (Addendum)
    GO TO THE FRONT DESK, PLEASE SCHEDULE YOUR APPOINTMENTS Come back for a physical by 11-2020

## 2020-04-17 NOTE — Progress Notes (Signed)
Subjective:    Patient ID: Dylan Hatfield, male    DOB: [birth year], 54 y.o.   MRN: [id number]  DOS:  04/17/2020 Type of visit - description: Follow-up We talk about HSV, DJD (pain and all the joints in the hands, worse when he is inactive, inability to completely flex the middle finger bilaterally), L UTS. Also complained of thickening and dystrophy at the right great toenail.  Thinks is fungal.  He would like to be treated.  Review of Systems See above   Past Medical History:  Diagnosis Date  . Arthritis   . Chronic back pain    bilateral L5/S1 transforaminal lumbar epidural steroid injections, Dr. [name]  . DDD (degenerative disc disease), lumbosacral   . Lumbosacral spondylosis   . Sleep apnea    on CPAP, dx ~ 2008     Past Surgical History:  Procedure Laterality Date  . VASECTOMY      Allergies as of 04/17/2020   No Known Allergies     Medication List       Accurate as of April 17, 2020  8:02 AM. If you have any questions, ask your nurse or doctor.        acyclovir 400 MG tablet Commonly known as: Zovirax Take 1 tablet (400 mg total) by mouth 2 (two) times daily.   acyclovir ointment 5 % Commonly known as: Zovirax Apply 1 application topically 4 (four) times daily as needed.   Advil 200 MG tablet Generic drug: ibuprofen Take 200 mg by mouth every 6 (six) hours as needed.   ADVIL PM PO Take 1 tablet by mouth at bedtime as needed.   HYDROcodone-acetaminophen 10-325 MG tablet Commonly known as: NORCO Take 1 tablet by mouth as needed.   tamsulosin 0.4 MG Caps capsule Commonly known as: FLOMAX Take 1 capsule (0.4 mg total) by mouth daily after supper.   TESTOSTERONE NA Place into the nose.          Objective:   Physical Exam BP 128/82 (BP Location: Left Arm, Patient Position: Sitting, Cuff Size: Normal)   Pulse 63   Temp 98 F (36.7 C) (Oral)   Resp 18   Ht 6\' 4"  (1.93 m)   Wt 253 lb 2 oz (114.8 kg)   SpO2 98%   BMI 30.81 kg/m  General:     Well developed, NAD, BMI noted. HEENT:  Normocephalic . Face symmetric, atraumatic MSK: Bony changes consistent with DJD on all finger joints, inability to completely flex the PIP in the middle fingers confirmed.  No synovitis. Lower extremities: no pretibial edema bilaterally  Skin: Right great toe nail: About 40% of it is thickened dystrophic Neurologic:  alert & oriented X3.  Speech normal, gait appropriate for age and unassisted Psych--  Cognition and judgment appear intact.  Cooperative with normal attention span and concentration.  Behavior appropriate. No anxious or depressed appearing.      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  Unable to completely flex some of his fingers.  Plans to see  Ortho when needed LUTS: See last visit, started Flomax, no side effects, symptoms definitely decreased, less nocturia HSV: On acyclovir, no further rash. Onychomycosis: Suspect dystrophic nail is fungal related, request treatment, nail scraped , will send a KOH and culture, pro cons of treatment discussed including the need to check his LFTs.  He agreed. RTC 11-2020 CPX   This visit occurred during the SARS-CoV-2 public health emergency.  Safety protocols were in place, including screening questions prior to the visit, additional usage of staff PPE, and extensive cleaning of exam room while observing appropriate contact time as indicated for disinfecting solutions.

## 2020-04-17 NOTE — Progress Notes (Signed)
Pre visit review using our clinic review tool, if applicable. No additional management support is needed unless otherwise documented below in the visit note. 

## 2020-04-18 NOTE — Assessment & Plan Note (Signed)
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  Unable to completely flex some of his fingers.  Plans to see Ortho when needed LUTS: See last visit, started Flomax, no side effects, symptoms definitely decreased, less nocturia HSV: On acyclovir, no further rash. Onychomycosis: Suspect dystrophic nail is fungal related, request treatment, nail scraped , will send a KOH and culture, pro cons of treatment discussed including the need to check his LFTs.  He agreed. RTC 11-2020 CPX

## 2020-04-29 MED ORDER — TERBINAFINE HCL 250 MG PO TABS: 250.0000 mg | ORAL_TABLET | Freq: Every day | ORAL | 0 refills | Status: DC

## 2020-04-29 NOTE — Addendum Note (Signed)
Addended byDamita Dunnings D on: 04/29/2020 04:42 PM   Modules accepted: Orders

## 2020-05-12 LAB — CULT, FUNGUS, SKIN,HAIR,NAIL W/KOH
MICRO NUMBER:: 10737301
SMEAR:: NONE SEEN
SPECIMEN QUALITY:: ADEQUATE

## 2020-05-14 ENCOUNTER — Encounter: Payer: Self-pay | Admitting: Internal Medicine

## 2020-05-18 ENCOUNTER — Encounter: Payer: Self-pay | Admitting: Neurology

## 2020-05-20 ENCOUNTER — Ambulatory Visit: Payer: 59 | Admitting: Adult Health

## 2020-05-20 VITALS — BP 138/81 | HR 65 | Ht 76.0 in | Wt 258.0 lb

## 2020-05-20 DIAGNOSIS — Z9989 Dependence on other enabling machines and devices: Secondary | ICD-10-CM

## 2020-05-20 DIAGNOSIS — G4733 Obstructive sleep apnea (adult) (pediatric): Secondary | ICD-10-CM | POA: Diagnosis not present

## 2020-05-20 NOTE — Patient Instructions (Signed)
Continue using CPAP nightly and greater than 4 hours each night °If your symptoms worsen or you develop new symptoms please let us know.  ° °

## 2020-05-20 NOTE — Progress Notes (Signed)
PATIENT: Dylan Hatfield DOB: 06-21-1966  REASON FOR VISIT: follow up HISTORY FROM: patient  HISTORY OF PRESENT ILLNESS: Today 05/20/20:  Dylan Hatfield is a 54 year old male with a history of obstructive sleep apnea on CPAP.  His download indicates that he uses machine nightly for compliance of 100%.  He uses machine greater than 4 hours each night.  On average he uses his machine 8 hours and 3 minutes.  His residual AHI is 0.5 on 4 to 15 cm of water with EPR 3.  Leak in the 95th percentile is 1.6 L/min.  He reports that his mask is too little.  They do not make a larger size than the current style that he has.  He is requesting a mask refitting.    REVIEW OF SYSTEMS: Out of a complete 14 system review of symptoms, the patient complains only of the following symptoms, and all other reviewed systems are negative.  ESS 3  ALLERGIES: No Known Allergies  HOME MEDICATIONS: Outpatient Medications Prior to Visit  Medication Sig Dispense Refill  . acyclovir (ZOVIRAX) 400 MG tablet Take 1 tablet (400 mg total) by mouth 2 (two) times daily. 60 tablet 6  . HYDROcodone-acetaminophen (NORCO) 10-325 MG per tablet Take 1 tablet by mouth as needed.    [name] ibuprofen (ADVIL) 200 MG tablet Take 200 mg by mouth every 6 (six) hours as needed.    . Ibuprofen-diphenhydrAMINE Cit (ADVIL PM PO) Take 1 tablet by mouth at bedtime as needed.    . tamsulosin (FLOMAX) 0.4 MG CAPS capsule Take 1 capsule (0.4 mg total) by mouth daily after supper. 30 capsule 6  . terbinafine (LAMISIL) 250 MG tablet Take 1 tablet (250 mg total) by mouth daily. 30 tablet 0  . TESTOSTERONE NA by Other route. Left and right hip    . acyclovir ointment (ZOVIRAX) 5 % Apply 1 application topically 4 (four) times daily as needed. (Patient not taking: Reported on 04/17/2020) 30 g 3   No facility-administered medications prior to visit.    PAST MEDICAL HISTORY: Past Medical History:  Diagnosis Date  . Arthritis   . Chronic back pain     bilateral L5/S1 transforaminal lumbar epidural steroid injections, Dr. [name]  . DDD (degenerative disc disease), lumbosacral   . Lumbosacral spondylosis   . Sleep apnea    on CPAP, dx ~ 2008     PAST SURGICAL HISTORY: Past Surgical History:  Procedure Laterality Date  . VASECTOMY      FAMILY HISTORY: Family History  Problem Relation Age of Onset  . Lung cancer Father   . Stroke Other        GM stroke 35  . Breast cancer Other        GM  . Arrhythmia Maternal Aunt   . Diabetes Neg Hx   . CAD Neg Hx   . Colon cancer Neg Hx   . Prostate cancer Neg Hx   . Esophageal cancer Neg Hx   . Rectal cancer Neg Hx   . Stomach cancer Neg Hx     SOCIAL HISTORY: Social History   Socioeconomic History  . Marital status: Married    Spouse name: Not on file  . Number of children: 2  . Years of education: Not on file  . Highest education level: Not on file  Occupational History  . 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[employer]  Tobacco Use  . Smoking status: Former Research scientist (life sciences)  .  Smokeless tobacco: Former Systems developer  . Tobacco comment: 1 ppd, quit 2003  Vaping Use  . Vaping Use: Never used  Substance and Sexual Activity  . Alcohol use: No  . Drug use: No    Types: Hydrocodone  . Sexual activity: Yes  Other Topics Concern  . Not on file  Social History Narrative   Daughter: graduated from college   Son : graduated HS, works w/ father    Social Determinants of Radio broadcast assistant Strain:   . Difficulty of Paying Living Expenses: Not on file  Food Insecurity:   . Worried About Charity fundraiser in the Last Year: Not on file  . Ran Out of Food in the Last Year: Not on file  Transportation Needs:   . Lack of Transportation (Medical): Not on file  . Lack of Transportation (Non-Medical): Not on file  Physical Activity:   . Days of Exercise per Week: Not on file  . Minutes of Exercise per Session: Not on file  Stress:   . Feeling of  Stress : Not on file  Social Connections:   . Frequency of Communication with Friends and Family: Not on file  . Frequency of Social Gatherings with Friends and Family: Not on file  . Attends Religious Services: Not on file  . Active Member of Clubs or Organizations: Not on file  . Attends Archivist Meetings: Not on file  . Marital Status: Not on file  Intimate Partner Violence:   . Fear of Current or Ex-Partner: Not on file  . Emotionally Abused: Not on file  . Physically Abused: Not on file  . Sexually Abused: Not on file      PHYSICAL EXAM  Vitals:   05/20/20 1003  BP: 138/81  Pulse: 65  Weight: 258 lb (117 kg)  Height: 6\' 4"  (1.93 m)   Body mass index is 31.4 kg/m.  Generalized: Well developed, in no acute distress  Chest: Lungs clear to auscultation bilaterally  Neurological examination  Mentation: Alert oriented to time, place, history taking. Follows all commands speech and language fluent Cranial nerve II-XII: Extraocular movements were full, visual field were full on confrontational test Head turning and shoulder shrug  were normal and symmetric. Motor: The motor testing reveals 5 over 5 strength of all 4 extremities. Good symmetric motor tone is noted throughout.  Sensory: Sensory testing is intact to soft touch on all 4 extremities. No evidence of extinction is noted.  Gait and station: Gait is normal.    DIAGNOSTIC DATA (LABS, IMAGING, TESTING) - I reviewed patient records, labs, notes, testing and imaging myself where available.  Lab Results  Component Value Date   WBC 5.9 12/18/2019   HGB 16.3 12/18/2019   HCT 48.4 12/18/2019   MCV 89.2 12/18/2019   PLT 196.0 12/18/2019      Component Value Date/Time   NA 138 12/18/2019 0835   K 4.7 12/18/2019 0835   CL 102 12/18/2019 0835   CO2 29 12/18/2019 0835   GLUCOSE 105 (H) 12/18/2019 0835   BUN 21 12/18/2019 0835   CREATININE 0.95 12/18/2019 0835   CALCIUM 9.8 12/18/2019 0835   PROT 6.5  12/18/2019 0835   ALBUMIN 4.7 12/18/2019 0835   AST 19 12/18/2019 0835   ALT 27 12/18/2019 0835   ALKPHOS 47 12/18/2019 0835   BILITOT 0.9 12/18/2019 0835   GFRNONAA 86.09 12/10/2009 0941   GFRAA 94 12/05/2008 0753   Lab Results  Component Value Date  CHOL 169 12/18/2019   HDL 29.40 (L) 12/18/2019   LDLCALC 113 (H) 12/18/2019   LDLDIRECT 104.0 11/25/2016   TRIG 137.0 12/18/2019   CHOLHDL 6 12/18/2019   Lab Results  Component Value Date   HGBA1C 5.0 12/18/2019   No results found for: VITAMINB12 Lab Results  Component Value Date   TSH 2.79 11/25/2016      ASSESSMENT AND PLAN 54 y.o. year old male  has a past medical history of Arthritis, Chronic back pain, DDD (degenerative disc disease), lumbosacral, Lumbosacral spondylosis, and Sleep apnea. here with:  1. OSA on CPAP  - CPAP compliance excellent - Good treatment of AHI  - Encourage patient to use CPAP nightly and > 4 hours each night - F/U in 1 year or sooner if needed   I spent 20 minutes of face-to-face and non-face-to-face time with patient.  This included previsit chart review, lab review, study review, order entry, electronic health record documentation, patient education.  Ward Givens, MSN, NP-C 05/20/2020, 10:22 AM [facility] [address], [place], [place] [address] [phone]

## 2020-05-21 NOTE — Progress Notes (Signed)
Order for cpap supplies sent to [facility] via community msg. Confirmation received that the order transmitted was successful.  

## 2020-05-27 ENCOUNTER — Other Ambulatory Visit: Payer: 59

## 2020-05-27 ENCOUNTER — Other Ambulatory Visit: Payer: Self-pay

## 2020-05-27 DIAGNOSIS — B351 Tinea unguium: Secondary | ICD-10-CM

## 2020-05-27 LAB — HEPATIC FUNCTION PANEL
AG Ratio: 2.4 (calc) (ref 1.0–2.5)
ALT: 35 U/L (ref 9–46)
AST: 23 U/L (ref 10–35)
Albumin: 4.8 g/dL (ref 3.6–5.1)
Alkaline phosphatase (APISO): 51 U/L (ref 35–144)
Bilirubin, Direct: 0.1 mg/dL (ref 0.0–0.2)
Globulin: 2 g/dL (calc) (ref 1.9–3.7)
Indirect Bilirubin: 0.7 mg/dL (calc) (ref 0.2–1.2)
Total Bilirubin: 0.8 mg/dL (ref 0.2–1.2)
Total Protein: 6.8 g/dL (ref 6.1–8.1)

## 2020-05-27 NOTE — Addendum Note (Signed)
Addended by: Angelina Pih on: 05/27/2020 09:37 AM   Modules accepted: Orders

## 2020-05-29 ENCOUNTER — Other Ambulatory Visit: Payer: Self-pay | Admitting: Internal Medicine

## 2020-05-29 MED ORDER — TERBINAFINE HCL 250 MG PO TABS
250.0000 mg | ORAL_TABLET | Freq: Every day | ORAL | 1 refills | Status: DC
Start: 2020-05-29 — End: 2020-08-19

## 2020-07-01 ENCOUNTER — Other Ambulatory Visit: Payer: Self-pay | Admitting: Internal Medicine

## 2020-07-30 ENCOUNTER — Encounter: Payer: Self-pay | Admitting: Internal Medicine

## 2020-08-19 ENCOUNTER — Other Ambulatory Visit: Payer: Self-pay

## 2020-08-19 ENCOUNTER — Ambulatory Visit: Payer: 59 | Admitting: Internal Medicine

## 2020-08-19 ENCOUNTER — Encounter: Payer: Self-pay | Admitting: Internal Medicine

## 2020-08-19 VITALS — BP 152/92 | HR 66 | Temp 97.6°F | Ht 76.0 in | Wt 260.0 lb

## 2020-08-19 DIAGNOSIS — Z23 Encounter for immunization: Secondary | ICD-10-CM

## 2020-08-19 DIAGNOSIS — G4733 Obstructive sleep apnea (adult) (pediatric): Secondary | ICD-10-CM | POA: Diagnosis not present

## 2020-08-19 DIAGNOSIS — R030 Elevated blood-pressure reading, without diagnosis of hypertension: Secondary | ICD-10-CM

## 2020-08-19 DIAGNOSIS — B351 Tinea unguium: Secondary | ICD-10-CM | POA: Diagnosis not present

## 2020-08-19 DIAGNOSIS — Z9989 Dependence on other enabling machines and devices: Secondary | ICD-10-CM

## 2020-08-19 NOTE — Assessment & Plan Note (Signed)
Onychomycosis: nail culture was negative but he did have Fusarium species, had Lamisil, LFTs remained normal, nails are looking better. OSA: Good CPAP compliance.   Elevated BP: BP today slightly elevated, recommend to start checking at home.  See AVS. Testosterone supplementation: Was told by the testost  prescriber MD that he is blood was "thick" and he should donate blood.  Advised patient that I increase hemoglobin can be from testosterone supplementation, OSA, etc. Preventive care: Flu shot today RTC CPX 11-2020

## 2020-08-19 NOTE — Patient Instructions (Addendum)
Check the  blood pressure  weekly BP GOAL is between 110/65 and  135/85. If it is consistently higher or lower, let me know  To find a good quality blood pressure cuff: [email or web address]    GO TO THE FRONT DESK, PLEASE SCHEDULE YOUR APPOINTMENTS Come back for a physical exam by 11-2020

## 2020-08-19 NOTE — Progress Notes (Signed)
Subjective:    Patient ID: Dylan Hatfield, male    DOB: 07-16-1966, 54 y.o.   MRN: [id number]  DOS:  08/19/2020 Type of visit - description: Routine follow-up In general feels well. BP is noted to be elevated today, hardly ever checks at home. He had Lamisil for onychomycosis, nails are already looking better, he did not have any side effects. We also talk about testosterone supplementation which is done elsewhere.  BP Readings from Last 3 Encounters:  08/19/20 (!) 152/92  05/20/20 138/81  04/17/20 128/82     Review of Systems See above   Past Medical History:  Diagnosis Date  . Arthritis   . Chronic back pain    bilateral L5/S1 transforaminal lumbar epidural steroid injections, Dr. [name]  . DDD (degenerative disc disease), lumbosacral   . Lumbosacral spondylosis   . Sleep apnea    on CPAP, dx ~ 2008     Past Surgical History:  Procedure Laterality Date  . VASECTOMY      Allergies as of 08/19/2020   No Known Allergies     Medication List       Accurate as of August 19, 2020  1:52 PM. If you have any questions, ask your nurse or doctor.        STOP taking these medications   terbinafine 250 MG tablet Commonly known as: LamISIL Stopped by: Kathlene November, MD     TAKE these medications   acyclovir 400 MG tablet Commonly known as: Zovirax Take 1 tablet (400 mg total) by mouth 2 (two) times daily.   Advil 200 MG tablet Generic drug: ibuprofen Take 200 mg by mouth every 6 (six) hours as needed.   ADVIL PM PO Take 1 tablet by mouth at bedtime as needed.   HYDROcodone-acetaminophen 10-325 MG tablet Commonly known as: NORCO Take 1 tablet by mouth as needed.   tamsulosin 0.4 MG Caps capsule Commonly known as: FLOMAX Take 1 capsule (0.4 mg total) by mouth daily after supper.   TESTOSTERONE NA by Other route. Left and right hip          Objective:   Physical Exam BP (!) 152/92 (BP Location: Right Arm, Patient Position: Sitting, Cuff Size: Large)    Pulse 66   Temp 97.6 F (36.4 C) (Oral)   Ht 6\' 4"  (1.93 m)   Wt 260 lb (117.9 kg)   SpO2 98%   BMI 31.65 kg/m  General:   Well developed, NAD, BMI noted. HEENT:  Normocephalic . Face symmetric, atraumatic Lungs:  CTA B Normal respiratory effort, no intercostal retractions, no accessory muscle use. Heart: RRR,  no murmur.  Lower extremities: no pretibial edema bilaterally  Skin: Not pale. Not jaundice Neurologic:  alert & oriented X3.  Speech normal, gait appropriate for age and unassisted Psych--  Cognition and judgment appear intact.  Cooperative with normal attention span and concentration.  Behavior appropriate. No anxious or depressed appearing.      Assessment     Assessment  OSA   Dx 2008, on CPAP MSK: DJD Trigger fingers  Chronic back pain-- sees pain mngmt  LUTS (nocturia x years, flomax caused retrograde ejaculation) Bony mass deformity of the chest wall, XR 06-2014 -- benign changes Lipoma: At the mid back, diameter 10 cm    Palpitations: saw cards (echo 2018) On testosterone (as of 11-2019, prescribed elsewhere)  PLAN      Onychomycosis: nail culture was negative but he did have Fusarium species, had Lamisil, LFTs remained normal,  nails are looking better. OSA: Good CPAP compliance.   Elevated BP: BP today slightly elevated, recommend to start checking at home.  See AVS. Testosterone supplementation: Was told by the testost  prescriber MD that he is blood was "thick" and he should donate blood.  Advised patient that I increase hemoglobin can be from testosterone supplementation, OSA, etc. Preventive care: Flu shot today RTC CPX 11-2020   This visit occurred during the SARS-CoV-2 public health emergency.  Safety protocols were in place, including screening questions prior to the visit, additional usage of staff PPE, and extensive cleaning of exam room while observing appropriate contact time as indicated for disinfecting solutions.

## 2020-10-19 ENCOUNTER — Encounter: Payer: Self-pay | Admitting: Internal Medicine

## 2020-10-21 ENCOUNTER — Other Ambulatory Visit: Payer: Self-pay

## 2020-10-21 ENCOUNTER — Encounter: Payer: Self-pay | Admitting: Internal Medicine

## 2020-10-21 ENCOUNTER — Telehealth (INDEPENDENT_AMBULATORY_CARE_PROVIDER_SITE_OTHER): Payer: 59 | Admitting: Internal Medicine

## 2020-10-21 ENCOUNTER — Other Ambulatory Visit: Payer: 59

## 2020-10-21 VITALS — Ht 76.0 in | Wt 255.0 lb

## 2020-10-21 DIAGNOSIS — Z20822 Contact with and (suspected) exposure to covid-19: Secondary | ICD-10-CM

## 2020-10-21 DIAGNOSIS — R030 Elevated blood-pressure reading, without diagnosis of hypertension: Secondary | ICD-10-CM | POA: Diagnosis not present

## 2020-10-21 DIAGNOSIS — J029 Acute pharyngitis, unspecified: Secondary | ICD-10-CM

## 2020-10-21 NOTE — Progress Notes (Signed)
Subjective:    Patient ID: Dylan Hatfield, male    DOB: [birth year], 55 y.o.   MRN: [id number]  DOS:  10/21/2020 Type of visit - description: Virtual Visit via Video Note  I connected with the above patient  by a video enabled telemedicine application and verified that I am speaking with the correct person using two identifiers.   THIS ENCOUNTER IS A VIRTUAL VISIT DUE TO COVID-19 - PATIENT WAS NOT SEEN IN THE OFFICE. PATIENT HAS CONSENTED TO VIRTUAL VISIT / TELEMEDICINE VISIT   Location of patient: home  Location of provider: office  Persons participating in the virtual visit: patient, provider   I discussed the limitations of evaluation and management by telemedicine and the availability of in person appointments. The patient expressed understanding and agreed to proceed.  Acute Symptoms a started 5 days ago Has a persistent sore throat, is taking Advil several times a day with good response. He denies other symptoms such as fever, chills or aches. No cough, no postnasal dripping, no heartburn.  I ask if he has seen his throat on the mirror and he has seen some redness but no white patches. Ambulatory BPs are typically okay with occasional elevated reading.  Review of Systems See above   Past Medical History:  Diagnosis Date  . Arthritis   . Chronic back pain    bilateral L5/S1 transforaminal lumbar epidural steroid injections, Dr. [name]  . DDD (degenerative disc disease), lumbosacral   . Lumbosacral spondylosis   . Sleep apnea    on CPAP, dx ~ 2008     Past Surgical History:  Procedure Laterality Date  . VASECTOMY      Allergies as of 10/21/2020   No Known Allergies     Medication List       Accurate as of October 21, 2020 11:59 PM. If you have any questions, ask your nurse or doctor.        acyclovir 400 MG tablet Commonly known as: Zovirax Take 1 tablet (400 mg total) by mouth 2 (two) times daily. What changed: when to take this   ADVIL PM PO Take 1  tablet by mouth at bedtime as needed.   HYDROcodone-acetaminophen 10-325 MG tablet Commonly known as: NORCO Take 1 tablet by mouth as needed.   ibuprofen 200 MG tablet Commonly known as: ADVIL Take 200 mg by mouth every 6 (six) hours as needed.   tamsulosin 0.4 MG Caps capsule Commonly known as: FLOMAX Take 1 capsule (0.4 mg total) by mouth daily after supper.   TESTOSTERONE NA by Other route. Left and right hip          Objective:   Physical Exam Ht 6\' 4"  (1.93 m)   Wt 255 lb (115.7 kg)   BMI 31.04 kg/m  This is a virtual video visit, patient is alert oriented x3, in no apparent distress, voice is normal, no cough noted.  He looks comfortable.  No vital signs available    Assessment     Assessment  OSA   Dx 2008, on CPAP MSK: DJD Trigger fingers  Chronic back pain-- sees pain mngmt  LUTS (nocturia x years, flomax caused retrograde ejaculation) Bony mass deformity of the chest wall, XR 06-2014 -- benign changes Lipoma: At the mid back, diameter 10 cm    Palpitations: saw cards (echo 2018) On testosterone (as of 11-2019, prescribed elsewhere)  PLAN   Sore throat: 5 days history of sore throat without fever or other URI symptoms. He had  2 Covid vaccines and plans to get a booster. Advised patient is difficult to say if he has a respiratory infection or actually strep throat. Plan: Get tested for Covid, if negative he will call and come by the office.  I will do a throat exam and check for strep . Also recommend to decrease Advil intake in favor of Tylenol.  Risk of gastritis/ulcers discussed Elevated BP: He checks from time to time, readings are typically good with occasionally elevated number    I discussed the assessment and treatment plan with the patient. The patient was provided an opportunity to ask questions and all were answered. The patient agreed with the plan and demonstrated an understanding of the instructions.   The patient was advised to call back  or seek an in-person evaluation if the symptoms worsen or if the condition fails to improve as anticipated.

## 2020-10-21 NOTE — Progress Notes (Signed)
Pre visit review using our clinic review tool, if applicable. No additional management support is needed unless otherwise documented below in the visit note. 

## 2020-10-23 LAB — NOVEL CORONAVIRUS, NAA: SARS-CoV-2, NAA: NOT DETECTED

## 2020-10-23 LAB — SARS-COV-2, NAA 2 DAY TAT

## 2020-10-23 NOTE — Assessment & Plan Note (Signed)
Sore throat: 5 days history of sore throat without fever or other URI symptoms. He had 2 Covid vaccines and plans to get a booster. Advised patient is difficult to say if he has a respiratory infection or actually strep throat. Plan: Get tested for Covid, if negative he will call and come by the office.  I will do a throat exam and check for strep . Also recommend to decrease Advil intake in favor of Tylenol.  Risk of gastritis/ulcers discussed Elevated BP: He checks from time to time, readings are typically good with occasionally elevated number

## 2020-11-05 ENCOUNTER — Ambulatory Visit (INDEPENDENT_AMBULATORY_CARE_PROVIDER_SITE_OTHER): Admitting: Family Medicine

## 2020-11-05 VITALS — BP 140/82 | HR 84 | Temp 97.5°F | Resp 16 | Ht 71.0 in | Wt 216.6 lb

## 2020-11-05 MED ORDER — CLOTRIMAZOLE-BETAMETHASONE 1-0.05 % EX CREA
1.0000 | TOPICAL_CREAM | Freq: Two times a day (BID) | CUTANEOUS | 0 refills | Status: DC
Start: 2020-11-05 — End: 2023-03-21

## 2020-11-05 NOTE — Assessment & Plan Note (Signed)
 Stable  Ordered CMP, CBC, lipid panel  Follow up in the next couple weeks to discuss results and for a full physical exam

## 2020-11-05 NOTE — Assessment & Plan Note (Signed)
 Stable  Ordered colonoscopy referral  Follow up if results are abnormal

## 2020-11-05 NOTE — Progress Notes (Signed)
 Encounter Date:  11/05/20  5:02 PM   PCP: Henry Norman  MRN: [id number]  DOB: 1966/03/21       HPI:  Henry Norman is a 55 year old male that presents for a skin lesion check and to establish care.    Skin lesion - has had it for a couple months, doesn't hurt, itches occasionally, has not bled    PMH: none  PSH: left labrum repair, debridement of right shoulder, surgery on left knee twice (acl, pcl, meniscus)  Meds: clonazepam, mirtazapine    FH: unknown (adopted)    BJ:YNWG not drink (sober since 2004), chewing tobacco (5 years, every day)        ALLERGIES:    Allergies   Allergen Reactions   . Fluoxetine Palpitations   . Paroxetine Palpitations          REVIEW OF SYSTEMS:  Review of Systems   Constitutional: Negative for fever.   Respiratory: Negative for cough, chest tightness and shortness of breath.    Cardiovascular: Negative for chest pain and palpitations.   Gastrointestinal: Negative for abdominal pain.         PHYSICAL EXAM:   11/05/20  1656   BP: 140/82   Pulse: 84   Resp: 16   Temp: 97.5 F (36.4 C)   SpO2: 95%     Body mass index is 30.21 kg/m.    Ht Readings from Last 1 Encounters:   11/05/20 5\' 11"  (1.803 m)     Wt Readings from Last 1 Encounters:   11/05/20 98.2 kg (216 lb 9.6 oz)           Physical Exam  Vitals reviewed.   Cardiovascular:      Pulses: Normal pulses.      Heart sounds: Normal heart sounds.   Pulmonary:      Effort: Pulmonary effort is normal.      Breath sounds: Normal breath sounds.   Skin:                   ASSESSMENT & PLAN:    [name] was seen today for other and other.    Diagnoses and all orders for this visit:    Dermatitis  Assessment & Plan:  Stable  Prescribed betamethasone cream, apply as directed  Continue to monitor  Follow up as needed    Orders:  -     clotrimazole-betamethasone (LOTRISONE) 1-0.05 % cream; Apply 1 Application topically 2 times daily. Use a small amount as directed    Encounter to establish care  Assessment & Plan:  Stable  Ordered CMP, CBC, lipid  panel  Follow up in the next couple weeks to discuss results and for a full physical exam    Orders:  -     CBC w/ Diff Lavender  -     Comprehensive Metabolic Panel  -     Lipid Panel Green Plasma Separator Tube    Screening for colon cancer  Assessment & Plan:  Stable  Ordered colonoscopy referral  Follow up if results are abnormal    Orders:  -     Consult/Referral to GI for Colonoscopy Screening        ICD-10-CM ICD-9-CM    1. Dermatitis  L30.9 692.9 clotrimazole-betamethasone (LOTRISONE) 1-0.05 % cream   2. Encounter to establish care  Z76.89 V65.8 CBC w/ Diff Lavender      Comprehensive Metabolic Panel      Lipid Panel Green Plasma Separator Tube  3. Screening for colon cancer  Z12.11 V76.51 Consult/Referral to GI for Colonoscopy Screening           I, Dr. Kara Pacer, MD have been present through this entire encounter, I have reviewed and verified the HPI, the review of systems and past/family and medical history as documented by Henry Norman, MS3 who participated in this service. I have re-examined the patient after him and have verified the documentation of the exam and amended as needed and I personally dictated the medical decision making for this encounter.           Eletronically reviewed and signed by Henry Norman, M.D.      [facility]  [email or web address]

## 2020-11-05 NOTE — Assessment & Plan Note (Signed)
 Stable  Prescribed betamethasone cream, apply as directed  Continue to monitor  Follow up as needed

## 2020-12-16 ENCOUNTER — Other Ambulatory Visit: Payer: Self-pay | Admitting: Internal Medicine

## 2020-12-17 ENCOUNTER — Encounter: Payer: 59 | Admitting: Internal Medicine

## 2020-12-18 ENCOUNTER — Ambulatory Visit (INDEPENDENT_AMBULATORY_CARE_PROVIDER_SITE_OTHER): Payer: 59 | Admitting: Internal Medicine

## 2020-12-18 ENCOUNTER — Other Ambulatory Visit: Payer: Self-pay

## 2020-12-18 ENCOUNTER — Encounter: Payer: Self-pay | Admitting: Internal Medicine

## 2020-12-18 VITALS — BP 134/84 | HR 64 | Temp 98.4°F | Resp 16 | Ht 76.0 in | Wt 267.2 lb

## 2020-12-18 DIAGNOSIS — Z23 Encounter for immunization: Secondary | ICD-10-CM | POA: Diagnosis not present

## 2020-12-18 DIAGNOSIS — Z0000 Encounter for general adult medical examination without abnormal findings: Secondary | ICD-10-CM

## 2020-12-18 DIAGNOSIS — Z1159 Encounter for screening for other viral diseases: Secondary | ICD-10-CM

## 2020-12-18 DIAGNOSIS — E785 Hyperlipidemia, unspecified: Secondary | ICD-10-CM | POA: Diagnosis not present

## 2020-12-18 LAB — LIPID PANEL
Cholesterol: 207 mg/dL — ABNORMAL HIGH (ref 0–200)
HDL: 32.6 mg/dL — ABNORMAL LOW (ref >39.00)
LDL Cholesterol: 138 mg/dL — ABNORMAL HIGH (ref 0–99)
NonHDL: 174.62
Total CHOL/HDL Ratio: 6
Triglycerides: 185 mg/dL — ABNORMAL HIGH (ref 0.0–149.0)
VLDL: 37 mg/dL (ref 0.0–40.0)

## 2020-12-18 LAB — CBC WITH DIFFERENTIAL/PLATELET
Basophils Absolute: 0 10*3/uL (ref 0.0–0.1)
Basophils Relative: 0.5 % (ref 0.0–3.0)
Eosinophils Absolute: 0.2 10*3/uL (ref 0.0–0.7)
Eosinophils Relative: 2 % (ref 0.0–5.0)
HCT: 52.5 % — ABNORMAL HIGH (ref 39.0–52.0)
Hemoglobin: 17.9 g/dL — ABNORMAL HIGH (ref 13.0–17.0)
Lymphocytes Relative: 23.6 % (ref 12.0–46.0)
Lymphs Abs: 1.8 10*3/uL (ref 0.7–4.0)
MCHC: 34.1 g/dL (ref 30.0–36.0)
MCV: 86.9 fl (ref 78.0–100.0)
Monocytes Absolute: 0.5 10*3/uL (ref 0.1–1.0)
Monocytes Relative: 6.1 % (ref 3.0–12.0)
Neutro Abs: 5.2 10*3/uL (ref 1.4–7.7)
Neutrophils Relative %: 67.8 % (ref 43.0–77.0)
Platelets: 195 10*3/uL (ref 150.0–400.0)
RBC: 6.04 Mil/uL — ABNORMAL HIGH (ref 4.22–5.81)
RDW: 14.3 % (ref 11.5–15.5)
WBC: 7.6 10*3/uL (ref 4.0–10.5)

## 2020-12-18 LAB — COMPREHENSIVE METABOLIC PANEL
ALT: 33 U/L (ref 0–53)
AST: 19 U/L (ref 0–37)
Albumin: 4.8 g/dL (ref 3.5–5.2)
Alkaline Phosphatase: 41 U/L (ref 39–117)
BUN: 19 mg/dL (ref 6–23)
CO2: 27 mEq/L (ref 19–32)
Calcium: 9.4 mg/dL (ref 8.4–10.5)
Chloride: 103 mEq/L (ref 96–112)
Creatinine, Ser: 0.97 mg/dL (ref 0.40–1.50)
GFR: 87.89 mL/min (ref >60.00)
Glucose, Bld: 105 mg/dL — ABNORMAL HIGH (ref 70–99)
Potassium: 4.7 mEq/L (ref 3.5–5.1)
Sodium: 140 mEq/L (ref 135–145)
Total Bilirubin: 0.8 mg/dL (ref 0.2–1.2)
Total Protein: 6.6 g/dL (ref 6.0–8.3)

## 2020-12-18 LAB — TSH: TSH: 2.7 u[IU]/mL (ref 0.35–4.50)

## 2020-12-18 NOTE — Patient Instructions (Addendum)
   GO TO THE LAB : Get the blood work     GO TO THE FRONT DESK, Arma back in 2 to 3 months to get your second Shingrix dose  Come back in 1 year for your physical exam for a physical exam in 1 year    Advanced care planning ("Living will", "[place] of attorney")  Advance care planning is a process that supports adults in  understanding and sharing their preferences regarding future medical care.   The patient's preferences are recorded in documents called Advance Directives.    Advanced directives are completed (and can be modified at any time) while the patient is in full mental capacity.   The documentation should be available at all times to the patient, the family and the healthcare providers.  Bring in a copy to be scanned in your chart is an excellent idea and is recommended   This legal documents direct treatment decision making and/or appoint a surrogate to make the decision if the patient is not capable to do so.    Advance directives can be documented in many types of formats,  documents have names such as:  Lliving will  Durable power of attorney for healthcare (healthcare proxy or healthcare power of attorney)  Combined directives  Physician orders for life-sustaining treatment    More information at:  [email or web address]

## 2020-12-18 NOTE — Progress Notes (Signed)
Subjective:    Patient ID: Dylan Hatfield, male    DOB: Sep 29, 1965, 55 y.o.   MRN: [id number]  DOS:  12/18/2020 Type of visit - description: CPX Since the last office visit he is doing well. Weight gain noted, he is very aware of that. He continue with testosterone supplements  Wt Readings from Last 3 Encounters:  12/18/20 267 lb 4 oz (121.2 kg)  10/21/20 255 lb (115.7 kg)  08/19/20 260 lb (117.9 kg)      Review of Systems  Other than above, a 14 point review of systems is negative      Past Medical History:  Diagnosis Date  . Arthritis   . Chronic back pain    bilateral L5/S1 transforaminal lumbar epidural steroid injections, Dr. [name]  . DDD (degenerative disc disease), lumbosacral   . Lumbosacral spondylosis   . Sleep apnea    on CPAP, dx ~ 2008     Past Surgical History:  Procedure Laterality Date  . VASECTOMY      Allergies as of 12/18/2020   No Known Allergies     Medication List       Accurate as of December 18, 2020  1:12 PM. If you have any questions, ask your nurse or doctor.        STOP taking these medications   ADVIL PM PO Stopped by: Kathlene November, MD     TAKE these medications   acyclovir 400 MG tablet Commonly known as: ZOVIRAX Take 1 tablet (400 mg total) by mouth 2 (two) times daily.   HYDROcodone-acetaminophen 10-325 MG tablet Commonly known as: NORCO Take 1 tablet by mouth as needed.   ibuprofen 200 MG tablet Commonly known as: ADVIL Take 200 mg by mouth every 6 (six) hours as needed.   tamsulosin 0.4 MG Caps capsule Commonly known as: FLOMAX Take 1 capsule (0.4 mg total) by mouth daily after supper.   TESTOSTERONE NA by Other route. Left and right hip          Objective:   Physical Exam BP 134/84 (BP Location: Left Arm, Patient Position: Sitting, Cuff Size: Normal)   Pulse 64   Temp 98.4 F (36.9 C) (Oral)   Resp 16   Ht 6\' 4"  (1.93 m)   Wt 267 lb 4 oz (121.2 kg)   SpO2 98%   BMI 32.53 kg/m  General: Well  developed, NAD, BMI noted Neck: No  thyromegaly  HEENT:  Normocephalic . Face symmetric, atraumatic Lungs:  CTA B Normal respiratory effort, no intercostal retractions, no accessory muscle use. Heart: RRR,  no murmur.  Abdomen:  Not distended, soft, non-tender. No rebound or rigidity.   Lower extremities: no pretibial edema bilaterally  Skin: Exposed areas without rash. Not pale. Not jaundice Neurologic:  alert & oriented X3.  Speech normal, gait appropriate for age and unassisted Strength symmetric and appropriate for age.  Psych: Cognition and judgment appear intact.  Cooperative with normal attention span and concentration.  Behavior appropriate. No anxious or depressed appearing.     Assessment     Assessment  OSA   Dx 2008, on CPAP MSK:DJD. Trigger fingers  Chronic back pain-- sees pain mngmt  LUTS -nocturia. Bony mass deformity of the chest wall, XR 06-2014 -- benign changes Lipoma: At the mid back, diameter 10 cm    Palpitations: saw cards (echo 2018) On testosterone (as of 11-2019, prescribed elsewhere)  PLAN   Here for CPX OSA: Good CPAP compliance Chronic back pain: Doing relatively  okay, does not take excessive ibuprofen, occasionally takes painkillers. LUTS: He is now on Flomax, symptoms relatively well controlled. Testosterone supplementation:   seen by the prescriber last week, reportedly a PSA was checked, was told his blood was "thick" and he should donate blood every 3 months (last blood donation 6 months ago), perhaps they were referring to increase hemoglobin.  Advised patient that HRT is only indicated if he truly has hypogonadism, offered referral to Endo if so desired, he is aware HRT is not free of risks. RTC 1 year     This visit occurred during the SARS-CoV-2 public health emergency.  Safety protocols were in place, including screening questions prior to the visit, additional usage of staff PPE, and extensive cleaning of exam room while observing  appropriate contact time as indicated for disinfecting solutions.

## 2020-12-18 NOTE — Assessment & Plan Note (Signed)
Here for CPX OSA: Good CPAP compliance Chronic back pain: Doing relatively okay, does not take excessive ibuprofen, occasionally takes painkillers. LUTS: He is now on Flomax, symptoms relatively well controlled. Testosterone supplementation:   seen by the prescriber last week, reportedly a PSA was checked, was told his blood was "thick" and he should donate blood every 3 months (last blood donation 6 months ago), perhaps they were referring to increase hemoglobin.  Advised patient that HRT is only indicated if he truly has hypogonadism, offered referral to Endo if so desired, he is aware HRT is not free of risks. RTC 1 year

## 2020-12-18 NOTE — Assessment & Plan Note (Signed)
-  Td 2020  - shingrix #1 today, come back in 2 or 3 months for a booster - Calcium x2, plans to get a booster  CCS: Colonoscopy 01/2017, + polyps, 5 years Prostate cancer screening: On testosterone, takes Flomax, LUTS at baseline.  Reportedly had a PSA checked last week.  See comments under "testosterone supplementation". Labs:  CMP, FLP, CBC, TSH, hep C Diet, exercise: Has gained weight, he is determined to do better. Advance directives discussed

## 2020-12-23 NOTE — Addendum Note (Signed)
Addended byDamita Dunnings D on: 12/23/2020 10:20 AM   Modules accepted: Orders

## 2021-02-10 ENCOUNTER — Encounter: Payer: Self-pay | Admitting: Internal Medicine

## 2021-02-12 ENCOUNTER — Encounter: Payer: Self-pay | Admitting: Medical

## 2021-02-12 ENCOUNTER — Telehealth (INDEPENDENT_AMBULATORY_CARE_PROVIDER_SITE_OTHER): Payer: 59 | Admitting: Medical

## 2021-02-12 ENCOUNTER — Other Ambulatory Visit: Payer: Self-pay

## 2021-02-12 VITALS — BP 154/93 | HR 70 | Temp 99.5°F | Ht 76.0 in | Wt 260.0 lb

## 2021-02-12 DIAGNOSIS — U071 COVID-19: Secondary | ICD-10-CM

## 2021-02-12 DIAGNOSIS — R059 Cough, unspecified: Secondary | ICD-10-CM

## 2021-02-12 MED ORDER — FLUTICASONE PROPIONATE 50 MCG/ACT NA SUSP: 2.0000 | Freq: Every day | NASAL | 1 refills | Status: DC

## 2021-02-12 MED ORDER — BENZONATATE 100 MG PO CAPS: 100.0000 mg | ORAL_CAPSULE | Freq: Three times a day (TID) | ORAL | 0 refills | Status: DC | PRN

## 2021-02-12 MED ORDER — AZITHROMYCIN 250 MG PO TABS: ORAL_TABLET | ORAL | 0 refills | Status: AC

## 2021-02-12 MED ORDER — BUDESONIDE-FORMOTEROL FUMARATE 160-4.5 MCG/ACT IN AERO: 2.0000 | INHALATION_SPRAY | Freq: Two times a day (BID) | RESPIRATORY_TRACT | 3 refills | Status: DC

## 2021-02-12 MED ORDER — NIRMATRELVIR/RITONAVIR (PAXLOVID)TABLET: 3.0000 | ORAL_TABLET | Freq: Two times a day (BID) | ORAL | 0 refills | Status: AC

## 2021-02-12 NOTE — Patient Instructions (Addendum)
Recent COVID infection.  Test came back positive on Tuesday with symptom onset late Saturday evening.   COVID risk for of 2 on epic review but does have history of smoking, blood pressure elevated today and mild elevated sugars in the past.  So score could be 3-4.  Overall describes moderate illness.  On fifth day since symptom onset.  On review has good kidney function.  Advised use vitamin D over-the-counter and zinc.  With recent intermittent productive cough and approaching weekend decided to go ahead and prescribe azithromycin antibiotic.  Also prescribed flonase nasal spray for nasal congestion and benzonatate cough tablets.  Symbicort inhaler Rx in the event of shortness of breath or wheezing.  Paxil with prescription sent to pharmacy.  Rx advisement given.  Explained emergency use authorization designation.  Explained this is the fifth day and is indicated within 5 days of symptom onset.  Follow-up in 7 days virtual visit or sooner if needed.  Advised stay at home for 7 days and retest on seventh day to see if gets negative test as his work requires negative test before returning.  Advised to get O2 sat monitor and check O2 sats daily.  Counseled on appropriate readings and counseled on worrisome readings/O2 levels that would indicate need for ED evaluation.

## 2021-02-12 NOTE — Progress Notes (Signed)
   Subjective:    Patient ID: Dylan Hatfield, male    DOB: 1966-03-07, 55 y.o.   MRN: [id number]  HPI Virtual Visit via Video Note  I connected with Dylan Hatfield on 02/12/21 at 10:20 AM EDT by a video enabled telemedicine application and verified that I am speaking with the correct person using two identifiers.  Location: Patient: home Provider: office.  Participants- pt and myself.   I discussed the limitations of evaluation and management by telemedicine and the availability of in person appointments. The patient expressed understanding and agreed to proceed.  History of Present Illness: Pt states got covid + test on Tuesday. He states Saturday evening got light headed, slight dizzy. Sunday st, mild bodyaches and by Monday got dry cough and mild sob. Occasional productive cough  No history asthma or wheezing. Quit smoking in 2003. Smokes for 15 years. One pack a day or less.  No diabetes. Though sugars mild elavated in the past.  He states some mild/slight short of breath with activity.       Observations/Objective:  General-no acute distress, pleasant, oriented. Lungs- on inspection lungs appear unlabored. Neck- no tracheal deviation or jvd on inspection. Neuro- gross motor function appears intact.  Assessment and Plan: Recent COVID infection.  Test came back positive on Tuesday with symptom onset late Saturday evening.   COVID risk for of 2 on epic review but does have history of smoking, blood pressure elevated today and mild elevated sugars in the past.  So score could be 3-4.  Overall describes moderate illness.  On fifth day since symptom onset.  On review has good kidney function.  Advised use vitamin D over-the-counter and zinc.  With recent intermittent productive cough and approaching weekend decided to go ahead and prescribe azithromycin antibiotic.  Also prescribed flonase nasal spray for nasal congestion and benzonatate cough tablets.  Symbicort  inhaler Rx in the event of shortness of breath or wheezing.  Paxil with prescription sent to pharmacy.  Rx advisement given.  Explained emergency use authorization designation.  Explained this is the fifth day and is indicated within 5 days of symptom onset.  Follow-up in 7 days virtual visit or sooner if needed.  Advised stay at home for 7 days and retest on seventh day to see if gets negative test as his work requires negative test before returning.  Time spent with patient today was 25  minutes which consisted of chart revdiew, discussing diagnosis, work up treatment and documentation.  Follow Up Instructions:    I discussed the assessment and treatment plan with the patient. The patient was provided an opportunity to ask questions and all were answered. The patient agreed with the plan and demonstrated an understanding of the instructions.   The patient was advised to call back or seek an in-person evaluation if the symptoms worsen or if the condition fails to improve as anticipated.     Mackie Pai, PA-C    Review of Systems     Objective:   Physical Exam        Assessment & Plan:

## 2021-02-17 ENCOUNTER — Ambulatory Visit (INDEPENDENT_AMBULATORY_CARE_PROVIDER_SITE_OTHER): Payer: 59

## 2021-02-17 ENCOUNTER — Other Ambulatory Visit: Payer: Self-pay

## 2021-02-17 DIAGNOSIS — Z23 Encounter for immunization: Secondary | ICD-10-CM

## 2021-02-17 NOTE — Progress Notes (Signed)
Dylan Hatfield is a 55 y.o. male presents to the office today for shingrix injections, per physician's orders.  shingrix (med), 0.17mL (dose),  IM (route) was administered left deltoid (location) today. Patient tolerated injection.  Loleta Chance

## 2021-02-25 ENCOUNTER — Encounter: Payer: Self-pay | Admitting: Internal Medicine

## 2021-03-02 ENCOUNTER — Ambulatory Visit: Payer: 59 | Admitting: Internal Medicine

## 2021-03-02 ENCOUNTER — Other Ambulatory Visit: Payer: Self-pay

## 2021-03-02 ENCOUNTER — Ambulatory Visit (HOSPITAL_BASED_OUTPATIENT_CLINIC_OR_DEPARTMENT_OTHER)
Admission: RE | Admit: 2021-03-02 | Discharge: 2021-03-02 | Disposition: A | Discharge status: Home / Self Care | Payer: 59 | Source: Ambulatory Visit | Attending: Internal Medicine | Admitting: Internal Medicine

## 2021-03-02 ENCOUNTER — Encounter: Payer: Self-pay | Admitting: Internal Medicine

## 2021-03-02 VITALS — BP 126/82 | HR 67 | Temp 97.9°F | Resp 16 | Ht 76.0 in | Wt 262.4 lb

## 2021-03-02 DIAGNOSIS — M25571 Pain in right ankle and joints of right foot: Secondary | ICD-10-CM | POA: Diagnosis present

## 2021-03-02 NOTE — Patient Instructions (Signed)
Go to the first floor and get your x-ray  Arrange for your blood work to be done at your convenience (fasting)  We are referring you to sports medicine doctor  Use Voltaren gel twice daily Ice the ankle at nighttime

## 2021-03-02 NOTE — Progress Notes (Signed)
Subjective:    Patient ID: Dylan Hatfield, male    DOB: 06/09/1966, 55 y.o.   MRN: [id number]  DOS:  03/02/2021 Type of visit - description: Acute Symptoms a started 6 weeks ago with pain at the medial aspect of the right ankle. Pain is mild when he walks. If he sits down and then he starts walking, the pain is severe temporarily. Denies any injury Denies fever chills No redness or swelling.   Review of Systems See above   Past Medical History:  Diagnosis Date  . Arthritis   . Chronic back pain    bilateral L5/S1 transforaminal lumbar epidural steroid injections, Dr. [name]  . DDD (degenerative disc disease), lumbosacral   . Lumbosacral spondylosis   . Sleep apnea    on CPAP, dx ~ 2008     Past Surgical History:  Procedure Laterality Date  . VASECTOMY      Allergies as of 03/02/2021   No Known Allergies     Medication List       Accurate as of March 02, 2021 10:26 AM. If you have any questions, ask your nurse or doctor.        acyclovir 400 MG tablet Commonly known as: ZOVIRAX Take 1 tablet (400 mg total) by mouth 2 (two) times daily.   benzonatate 100 MG capsule Commonly known as: TESSALON Take 1 capsule (100 mg total) by mouth 3 (three) times daily as needed for cough.   budesonide-formoterol 160-4.5 MCG/ACT inhaler Commonly known as: SYMBICORT Inhale 2 puffs into the lungs 2 (two) times daily.   fluticasone 50 MCG/ACT nasal spray Commonly known as: FLONASE Place 2 sprays into both nostrils daily.   HYDROcodone-acetaminophen 10-325 MG tablet Commonly known as: NORCO Take 1 tablet by mouth as needed.   ibuprofen 200 MG tablet Commonly known as: ADVIL Take 200 mg by mouth every 6 (six) hours as needed.   tamsulosin 0.4 MG Caps capsule Commonly known as: FLOMAX Take 1 capsule (0.4 mg total) by mouth daily after supper.   TESTOSTERONE NA by Other route. Left and right hip          Objective:   Physical Exam Musculoskeletal:        Feet:    BP 126/82 (BP Location: Left Arm, Patient Position: Sitting, Cuff Size: Normal)   Pulse 67   Temp 97.9 F (36.6 C) (Oral)   Resp 16   Ht 6\' 4"  (1.93 m)   Wt 262 lb 6 oz (119 kg)   SpO2 98%   BMI 31.94 kg/m  General:   Well developed, NAD, BMI noted. HEENT:  Normocephalic . Face symmetric, atraumatic MSK: No ankle deformities.  See graphic. ROM of the ankle is normal, pain noted when I rotated left foot internally. Good perfusion in all toes Skin: Not pale. Not jaundice Neurologic:  alert & oriented X3.  Speech normal, gait appropriate for age and unassisted Psych--  Cognition and judgment appear intact.  Cooperative with normal attention span and concentration.  Behavior appropriate. No anxious or depressed appearing.      Assessment      Assessment  OSA   Dx 2008, on CPAP MSK:DJD. Trigger fingers  Chronic back pain-- sees pain mngmt  LUTS -nocturia. Bony mass deformity of the chest wall, XR 06-2014 -- benign changes Lipoma: At the mid back, diameter 10 cm    Palpitations: saw cards (echo 2018) On testosterone (as of 11-2019, prescribed elsewhere)  PLAN   Ankle pain: Possibly tendinitis, he  is already taking ibuprofen without much help. Plan: X-ray, Voltaren gel, ice, refer to sports medicine. Also, based on last labs, I recommended to repeat FLP, encouraged to proceed.   This visit occurred during the SARS-CoV-2 public health emergency.  Safety protocols were in place, including screening questions prior to the visit, additional usage of staff PPE, and extensive cleaning of exam room while observing appropriate contact time as indicated for disinfecting solutions.

## 2021-03-03 NOTE — Assessment & Plan Note (Signed)
Ankle pain: Possibly tendinitis, he is already taking ibuprofen without much help. Plan: X-ray, Voltaren gel, ice, refer to sports medicine. Also, based on last labs, I recommended to repeat FLP, encouraged to proceed.

## 2021-03-05 ENCOUNTER — Other Ambulatory Visit: Payer: Self-pay

## 2021-03-05 ENCOUNTER — Other Ambulatory Visit (INDEPENDENT_AMBULATORY_CARE_PROVIDER_SITE_OTHER): Payer: 59

## 2021-03-05 DIAGNOSIS — E785 Hyperlipidemia, unspecified: Secondary | ICD-10-CM

## 2021-03-05 LAB — LIPID PANEL
Cholesterol: 185 mg/dL (ref 0–200)
HDL: 31 mg/dL — ABNORMAL LOW (ref >39.00)
LDL Cholesterol: 118 mg/dL — ABNORMAL HIGH (ref 0–99)
NonHDL: 154.21
Total CHOL/HDL Ratio: 6
Triglycerides: 181 mg/dL — ABNORMAL HIGH (ref 0.0–149.0)
VLDL: 36.2 mg/dL (ref 0.0–40.0)

## 2021-03-08 ENCOUNTER — Encounter: Payer: Self-pay | Admitting: Internal Medicine

## 2021-03-11 ENCOUNTER — Other Ambulatory Visit: Payer: Self-pay

## 2021-03-11 ENCOUNTER — Ambulatory Visit: Payer: 59 | Admitting: Family Medicine

## 2021-03-11 ENCOUNTER — Encounter: Payer: Self-pay | Admitting: Family Medicine

## 2021-03-11 ENCOUNTER — Ambulatory Visit: Payer: Self-pay

## 2021-03-11 VITALS — BP 150/90 | Ht 76.0 in | Wt 255.0 lb

## 2021-03-11 DIAGNOSIS — M25571 Pain in right ankle and joints of right foot: Secondary | ICD-10-CM | POA: Insufficient documentation

## 2021-03-11 DIAGNOSIS — M76821 Posterior tibial tendinitis, right leg: Secondary | ICD-10-CM | POA: Diagnosis not present

## 2021-03-11 DIAGNOSIS — M958 Other specified acquired deformities of musculoskeletal system: Secondary | ICD-10-CM | POA: Insufficient documentation

## 2021-03-11 MED ORDER — PREDNISONE 5 MG PO TABS: ORAL_TABLET | ORAL | 0 refills | Status: DC

## 2021-03-11 NOTE — Patient Instructions (Signed)
Nice to meet you Please try the padding  Please try the exercises  Please try ice   Please send me a message in MyChart with any questions or updates.  Please see me back in 4 weeks.   --Dr. Raeford Razor

## 2021-03-11 NOTE — Progress Notes (Signed)
Dylan Hatfield - 55 y.o. male MRN [id number]  Date of birth: 10/18/1965  SUBJECTIVE:  Including CC & ROS.  No chief complaint on file.   Dylan Hatfield is a 55 y.o. male that is presenting with right ankle pain.  Pain has been ongoing for about a month.  No history of injury or inciting event.  Has been on his feet much more than normal..  Independent review of the right ankle x-ray from 6/7 shows no acute changes.   Review of Systems See HPI   HISTORY: Past Medical, Surgical, Social, and Family History Reviewed & Updated per EMR.   Pertinent Historical Findings include:  Past Medical History:  Diagnosis Date   Arthritis    Chronic back pain    bilateral L5/S1 transforaminal lumbar epidural steroid injections, Dr. [name]   DDD (degenerative disc disease), lumbosacral    Lumbosacral spondylosis    Sleep apnea    on CPAP, dx ~ 2008     Past Surgical History:  Procedure Laterality Date   VASECTOMY      Family History  Problem Relation Age of Onset   Lung cancer Father    Stroke Other        GM stroke 43   Breast cancer Other        GM   Arrhythmia Maternal Aunt    Diabetes Neg Hx    CAD Neg Hx    Colon cancer Neg Hx    Prostate cancer Neg Hx    Esophageal cancer Neg Hx    Rectal cancer Neg Hx    Stomach cancer Neg Hx     Social History   Socioeconomic History   Marital status: Married    Spouse name: Not on file   Number of children: 2   Years of education: Not on file   Highest education level: Not on file  Occupational History   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[employer]  Tobacco Use   Smoking status: Former    Pack years: 0.00   Smokeless tobacco: Former   Tobacco comments:    1 ppd, quit 2003  Vaping Use   Vaping Use: Never used  Substance and Sexual Activity   Alcohol use: No   Drug use: No    Types: Hydrocodone   Sexual activity: Yes  Other Topics Concern   Not on file  Social History Narrative    Daughter: graduated from college   Son : graduated HS, works w/ father    Social Determinants of Radio broadcast assistant Strain: Not on Art therapist Insecurity: Not on file  Transportation Needs: Not on file  Physical Activity: Not on file  Stress: Not on file  Social Connections: Not on file  Intimate Partner Violence: Not on file     PHYSICAL EXAM:  VS: BP (!) 150/90 (BP Location: Left Arm, Patient Position: Sitting, Cuff Size: Large)   Ht 6\' 4"  (1.93 m)   Wt 255 lb (115.7 kg)   BMI 31.04 kg/m  Physical Exam Gen: NAD, alert, cooperative with exam, well-appearing MSK:  Right ankle: Normal range of motion please Tenderness to palpation of the tarsal tunnel. Neurovascular intact  Limited ultrasound: Right ankle and foot:  Calcific changes at the insertion of the posterior tibialis into the navicular. No joint effusion of the ankle. Increased hyperemia in the inferior portion of the posterior tibialis as it enters the tarsal tunnel.  No encircling effusion.  Summary: Posterior tib tendinitis occurring at the lateral malleolus.  Ultrasound and interpretation by Clearance Coots, MD    ASSESSMENT & PLAN:   Posterior tibial tendinitis, right leg Having changes at the lateral malleolus of the posterior tibialis.  He is on his feet and does have translation of the foot medially at rest.  Like related to be on his feet more than normal.  No specific sign of a stress fracture at this time. -Counseled on home exercise therapy and supportive care. -Prednisone. -Scaphoid pad. -Could consider injection or physical therapy.

## 2021-03-11 NOTE — Assessment & Plan Note (Signed)
Having changes at the lateral malleolus of the posterior tibialis.  He is on his feet and does have translation of the foot medially at rest.  Like related to be on his feet more than normal.  No specific sign of a stress fracture at this time. -Counseled on home exercise therapy and supportive care. -Prednisone. -Scaphoid pad. -Could consider injection or physical therapy.

## 2021-03-24 ENCOUNTER — Other Ambulatory Visit: Payer: Self-pay | Admitting: Internal Medicine

## 2021-03-24 MED ORDER — ACYCLOVIR 400 MG PO TABS: 400.0000 mg | ORAL_TABLET | Freq: Two times a day (BID) | ORAL | 6 refills | Status: DC

## 2021-04-09 ENCOUNTER — Encounter: Payer: Self-pay | Admitting: Family Medicine

## 2021-04-09 ENCOUNTER — Other Ambulatory Visit: Payer: Self-pay

## 2021-04-09 ENCOUNTER — Ambulatory Visit: Payer: 59 | Admitting: Family Medicine

## 2021-04-09 DIAGNOSIS — M76821 Posterior tibial tendinitis, right leg: Secondary | ICD-10-CM | POA: Diagnosis not present

## 2021-04-09 NOTE — Progress Notes (Signed)
  [name] - 55 y.o. male MRN [id number]  Date of birth: August 24, 1966  SUBJECTIVE:  Including CC & ROS.  No chief complaint on file.   Dylan Hatfield is a 55 y.o. male that is following up for his right ankle and foot pain.  He reports 70% improvement with modifications.  He still gets intermittent mild pain.  He still is on his feet for about 10 hours/day.  And he is very active on the weekends.    Review of Systems See HPI   HISTORY: Past Medical, Surgical, Social, and Family History Reviewed & Updated per EMR.   Pertinent Historical Findings include:  Past Medical History:  Diagnosis Date   Arthritis    Chronic back pain    bilateral L5/S1 transforaminal lumbar epidural steroid injections, Dr. [name]   DDD (degenerative disc disease), lumbosacral    Lumbosacral spondylosis    Sleep apnea    on CPAP, dx ~ 2008     Past Surgical History:  Procedure Laterality Date   VASECTOMY      Family History  Problem Relation Age of Onset   Lung cancer Father    Stroke Other        GM stroke 54   Breast cancer Other        GM   Arrhythmia Maternal Aunt    Diabetes Neg Hx    CAD Neg Hx    Colon cancer Neg Hx    Prostate cancer Neg Hx    Esophageal cancer Neg Hx    Rectal cancer Neg Hx    Stomach cancer Neg Hx     Social History   Socioeconomic History   Marital status: Married    Spouse name: Not on file   Number of children: 2   Years of education: Not on file   Highest education level: Not on file  Occupational History   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[employer]  Tobacco Use   Smoking status: Former   Smokeless tobacco: Former   Tobacco comments:    1 ppd, quit 2003  Vaping Use   Vaping Use: Never used  Substance and Sexual Activity   Alcohol use: No   Drug use: No    Types: Hydrocodone   Sexual activity: Yes  Other Topics Concern   Not on file  Social History Narrative   Daughter: graduated from college   Son  : graduated HS, works w/ father    Social Determinants of Radio broadcast assistant Strain: Not on Art therapist Insecurity: Not on file  Transportation Needs: Not on file  Physical Activity: Not on file  Stress: Not on file  Social Connections: Not on file  Intimate Partner Violence: Not on file     PHYSICAL EXAM:  VS: BP (!) 150/92 (BP Location: Left Arm, Patient Position: Sitting, Cuff Size: Large)   Ht 6\' 4"  (1.93 m)   Wt 255 lb (115.7 kg)   BMI 31.04 kg/m  Physical Exam Gen: NAD, alert, cooperative with exam, well-appearing       ASSESSMENT & PLAN:   Posterior tibial tendinitis, right leg Improvement with adjustments made to his insoles. -Counseled on home exercise therapy and supportive care. -Could consider injection or physical therapy. -Follow-up as needed.

## 2021-04-09 NOTE — Assessment & Plan Note (Signed)
Improvement with adjustments made to his insoles. -Counseled on home exercise therapy and supportive care. -Could consider injection or physical therapy. -Follow-up as needed.

## 2021-05-25 NOTE — Progress Notes (Signed)
PATIENT: Dylan Hatfield DOB: 1966-06-12  REASON FOR VISIT: follow up HISTORY FROM: patient PRIMARY NEUROLOGIST: Dr. Brett Fairy  Virtual Visit via Video Note  I connected with Dylan Hatfield on 05/25/21 at  2:30 PM EDT by a video enabled telemedicine application located remotely at [facility] and verified that I am speaking with the correct person using two identifiers who was located at their own home.   I discussed the limitations of evaluation and management by telemedicine and the availability of in person appointments. The patient expressed understanding and agreed to proceed.   PATIENT: Dylan Hatfield DOB: 28-Aug-1966  REASON FOR VISIT: follow up HISTORY FROM: patient  HISTORY OF PRESENT ILLNESS: Today 05/25/21:  Dylan Hatfield is a 55 year old male with a history of OSA.  He returns today for follow-up.  He reports that the CPAP is working well for him.  He states that there was a week that he did not use it because [place] the airport.  Otherwise he is doing well.  His download is below.    HISTORY 05/20/20:   Dylan Hatfield is a 55 year old male with a history of obstructive sleep apnea on CPAP.  His download indicates that he uses machine nightly for compliance of 100%.  He uses machine greater than 4 hours each night.  On average he uses his machine 8 hours and 3 minutes.  His residual AHI is 0.5 on 4 to 15 cm of water with EPR 3.  Leak in the 95th percentile is 1.6 L/min.  He reports that his mask is too little.  They do not make a larger size than the current style that he has.  He is requesting a mask refitting.  REVIEW OF SYSTEMS: Out of a complete 14 system review of symptoms, the patient complains only of the following symptoms, and all other reviewed systems are negative.  ALLERGIES: No Known Allergies  HOME MEDICATIONS: Outpatient Medications Prior to Visit  Medication Sig Dispense Refill   acyclovir (ZOVIRAX) 400 MG tablet Take 1 tablet  (400 mg total) by mouth 2 (two) times daily. 60 tablet 6   benzonatate (TESSALON) 100 MG capsule Take 1 capsule (100 mg total) by mouth 3 (three) times daily as needed for cough. (Patient not taking: Reported on 03/02/2021) 30 capsule 0   budesonide-formoterol (SYMBICORT) 160-4.5 MCG/ACT inhaler Inhale 2 puffs into the lungs 2 (two) times daily. 1 each 3   fluticasone (FLONASE) 50 MCG/ACT nasal spray Place 2 sprays into both nostrils daily. 16 g 1   HYDROcodone-acetaminophen (NORCO) 10-325 MG per tablet Take 1 tablet by mouth as needed.     ibuprofen (ADVIL) 200 MG tablet Take 200 mg by mouth every 6 (six) hours as needed.     predniSONE (DELTASONE) 5 MG tablet Take 6 pills for first day, 5 pills second day, 4 pills third day, 3 pills fourth day, 2 pills the fifth day, and 1 pill sixth day. 21 tablet 0   tamsulosin (FLOMAX) 0.4 MG CAPS capsule Take 1 capsule (0.4 mg total) by mouth daily after supper. 90 capsule 3   TESTOSTERONE NA by Other route. Left and right hip     No facility-administered medications prior to visit.    PAST MEDICAL HISTORY: Past Medical History:  Diagnosis Date   Arthritis    Chronic back pain    bilateral L5/S1 transforaminal lumbar epidural steroid injections, Dr. [name]   DDD (degenerative disc disease), lumbosacral  Lumbosacral spondylosis    Sleep apnea    on CPAP, dx ~ 2008     PAST SURGICAL HISTORY: Past Surgical History:  Procedure Laterality Date   VASECTOMY      FAMILY HISTORY: Family History  Problem Relation Age of Onset   Lung cancer Father    Stroke Other        GM stroke 63   Breast cancer Other        GM   Arrhythmia Maternal Aunt    Diabetes Neg Hx    CAD Neg Hx    Colon cancer Neg Hx    Prostate cancer Neg Hx    Esophageal cancer Neg Hx    Rectal cancer Neg Hx    Stomach cancer Neg Hx     SOCIAL HISTORY: Social History   Socioeconomic History   Marital status: Married    Spouse name: Not on file   Number of children: 2    Years of education: Not on file   Highest education level: Not on file  Occupational History   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[employer]  Tobacco Use   Smoking status: Former   Smokeless tobacco: Former   Tobacco comments:    1 ppd, quit 2003  Vaping Use   Vaping Use: Never used  Substance and Sexual Activity   Alcohol use: No   Drug use: No    Types: Hydrocodone   Sexual activity: Yes  Other Topics Concern   Not on file  Social History Narrative   Daughter: graduated from college   Son : graduated HS, works w/ father    Social Determinants of Radio broadcast assistant Strain: Not on file  Food Insecurity: Not on file  Transportation Needs: Not on file  Physical Activity: Not on file  Stress: Not on file  Social Connections: Not on file  Intimate Partner Violence: Not on file      PHYSICAL EXAM Generalized: Well developed, in no acute distress   Neurological examination  Mentation: Alert oriented to time, place, history taking. Follows all commands speech and language fluent Cranial nerve II-XII:Extraocular movements were full. Facial symmetry noted. uvula tongue midline. Head turning and shoulder shrug  were normal and symmetric. Motor: Good strength throughout subjectively per patient Sensory: Sensory testing is intact to soft touch on all 4 extremities subjectively per patient Coordination: Cerebellar testing reveals good finger-nose-finger  Gait and station: Patient is able to stand from a seated position. gait is normal.  Reflexes: UTA  DIAGNOSTIC DATA (LABS, IMAGING, TESTING) - I reviewed patient records, labs, notes, testing and imaging myself where available.  Lab Results  Component Value Date   WBC 7.6 12/18/2020   HGB 17.9 (H) 12/18/2020   HCT 52.5 (H) 12/18/2020   MCV 86.9 12/18/2020   PLT 195.0 12/18/2020      Component Value Date/Time   NA 140 12/18/2020 0834   K 4.7 12/18/2020 0834   CL 103  12/18/2020 0834   CO2 27 12/18/2020 0834   GLUCOSE 105 (H) 12/18/2020 0834   BUN 19 12/18/2020 0834   CREATININE 0.97 12/18/2020 0834   CALCIUM 9.4 12/18/2020 0834   PROT 6.6 12/18/2020 0834   ALBUMIN 4.8 12/18/2020 0834   AST 19 12/18/2020 0834   ALT 33 12/18/2020 0834   ALKPHOS 41 12/18/2020 0834   BILITOT 0.8 12/18/2020 0834   GFRNONAA 86.09 12/10/2009 0941   GFRAA 94 12/05/2008 0753  Lab Results  Component Value Date   CHOL 185 03/05/2021   HDL 31.00 (L) 03/05/2021   LDLCALC 118 (H) 03/05/2021   LDLDIRECT 104.0 11/25/2016   TRIG 181.0 (H) 03/05/2021   CHOLHDL 6 03/05/2021   Lab Results  Component Value Date   HGBA1C 5.0 12/18/2019   No results found for: VITAMINB12 Lab Results  Component Value Date   TSH 2.70 12/18/2020      ASSESSMENT AND PLAN 55 y.o. year old male  has a past medical history of Arthritis, Chronic back pain, DDD (degenerative disc disease), lumbosacral, Lumbosacral spondylosis, and Sleep apnea. here with:  OSA on CPAP  CPAP compliance excellent Residual AHI is good Encouraged patient to continue using CPAP nightly and > 4 hours each night F/U in 1 year or sooner if needed    Ward Givens, MSN, NP-C 05/25/2021, 6:53 PM [facility] [address], [place], [place] [address] [phone]

## 2021-05-26 ENCOUNTER — Telehealth (INDEPENDENT_AMBULATORY_CARE_PROVIDER_SITE_OTHER): Payer: 59 | Admitting: Adult Health

## 2021-05-26 DIAGNOSIS — Z9989 Dependence on other enabling machines and devices: Secondary | ICD-10-CM

## 2021-05-26 DIAGNOSIS — G4733 Obstructive sleep apnea (adult) (pediatric): Secondary | ICD-10-CM | POA: Diagnosis not present

## 2021-06-03 ENCOUNTER — Encounter: Payer: Self-pay | Admitting: Internal Medicine

## 2021-06-03 ENCOUNTER — Ambulatory Visit: Payer: 59 | Admitting: Internal Medicine

## 2021-06-03 ENCOUNTER — Other Ambulatory Visit: Payer: Self-pay

## 2021-06-03 VITALS — BP 144/78 | HR 73 | Temp 98.5°F | Resp 16 | Ht 76.0 in | Wt 263.4 lb

## 2021-06-03 DIAGNOSIS — I10 Essential (primary) hypertension: Secondary | ICD-10-CM | POA: Diagnosis not present

## 2021-06-03 MED ORDER — AMLODIPINE BESYLATE 5 MG PO TABS: 5.0000 mg | ORAL_TABLET | Freq: Every day | ORAL | 0 refills | Status: DC

## 2021-06-03 NOTE — Progress Notes (Signed)
Subjective:    Patient ID: Dylan Hatfield, male    DOB: 02/07/1966, 55 y.o.   MRN: [id number]  DOS:  06/03/2021 Type of visit - description: Acute  Reports that his BP has been at other doctors offices and it has been slightly elevated. Went to the [facility] and it was 186/92. He has been checking at home, readings has not been less than 144/82. Other than that he feels well. He is active at work but hardly ever elevate his heart rate. Admits to eating a lot of salt.  BP Readings from Last 3 Encounters:  06/03/21 (!) 144/78  04/09/21 (!) 150/92  03/11/21 (!) 150/90   Wt Readings from Last 3 Encounters:  06/03/21 263 lb 6 oz (119.5 kg)  04/09/21 255 lb (115.7 kg)  03/11/21 255 lb (115.7 kg)    Review of Systems See above   Past Medical History:  Diagnosis Date   Arthritis    Chronic back pain    bilateral L5/S1 transforaminal lumbar epidural steroid injections, Dr. [name]   DDD (degenerative disc disease), lumbosacral    Lumbosacral spondylosis    Sleep apnea    on CPAP, dx ~ 2008     Past Surgical History:  Procedure Laterality Date   VASECTOMY      Allergies as of 06/03/2021   No Known Allergies      Medication List        Accurate as of June 03, 2021 11:59 PM. If you have any questions, ask your nurse or doctor.          STOP taking these medications    benzonatate 100 MG capsule Commonly known as: TESSALON Stopped by: Kathlene November, MD   budesonide-formoterol 160-4.5 MCG/ACT inhaler Commonly known as: SYMBICORT Stopped by: Kathlene November, MD   fluticasone 50 MCG/ACT nasal spray Commonly known as: FLONASE Stopped by: Kathlene November, MD   predniSONE 5 MG tablet Commonly known as: DELTASONE Stopped by: Kathlene November, MD       TAKE these medications    acyclovir 400 MG tablet Commonly known as: ZOVIRAX Take 1 tablet (400 mg total) by mouth 2 (two) times daily.   amLODipine 5 MG tablet Commonly known as: NORVASC Take 1 tablet (5 mg total) by mouth  daily. Started by: Kathlene November, MD   HYDROcodone-acetaminophen 10-325 MG tablet Commonly known as: NORCO Take 1 tablet by mouth as needed.   ibuprofen 200 MG tablet Commonly known as: ADVIL Take 200 mg by mouth every 6 (six) hours as needed.   tamsulosin 0.4 MG Caps capsule Commonly known as: FLOMAX Take 1 capsule (0.4 mg total) by mouth daily after supper.   TESTOSTERONE NA by Other route. Left and right hip           Objective:   Physical Exam BP (!) 144/78 (BP Location: Left Arm, Patient Position: Sitting, Cuff Size: Normal)   Pulse 73   Temp 98.5 F (36.9 C) (Oral)   Resp 16   Ht '6\' 4"'$  (1.93 m)   Wt 263 lb 6 oz (119.5 kg)   SpO2 97%   BMI 32.06 kg/m  General:   Well developed, NAD, BMI noted. HEENT:  Normocephalic . Face symmetric, atraumatic Lungs:  CTA B Normal respiratory effort, no intercostal retractions, no accessory muscle use. Heart: RRR,  no murmur.  Lower extremities: no pretibial edema bilaterally  Skin: Not pale. Not jaundice Neurologic:  alert & oriented X3.  Speech normal, gait appropriate for age and unassisted  Psych--  Cognition and judgment appear intact.  Cooperative with normal attention span and concentration.  Behavior appropriate. No anxious or depressed appearing.      Assessment     Assessment  OSA   Dx 2008, on CPAP MSK:DJD. Trigger fingers  Chronic back pain-- sees pain mngmt  LUTS -nocturia. Bony mass deformity of the chest wall, XR 06-2014 -- benign changes Lipoma: At the mid back, diameter 10 cm    Palpitations: saw cards (echo 2018) On testosterone (as of 11-2019, prescribed elsewhere)  PLAN   HTN: Diagnosed today based on ambulatory BPs, chart review, BP readings at the [facility]. Explained the patient the treatment: Low-salt diet, increase physical activity, start amlodipine, watch for edema.  Monitor BPs, goals provided. Vaccine advice provided as well. RTC 4 months.  This visit occurred during the SARS-CoV-2  public health emergency.  Safety protocols were in place, including screening questions prior to the visit, additional usage of staff PPE, and extensive cleaning of exam room while observing appropriate contact time as indicated for disinfecting solutions.

## 2021-06-03 NOTE — Patient Instructions (Addendum)
Start amlodipine 5 mg at night  Low salt diet  Regular exercise   Check the  blood pressure 2 or 3 times a   week   BP GOAL is between 110/65 and  135/85. If it is consistently higher or lower, let me know   Recommend to proceed with the following vaccines at your pharmacy:  Covid #3 Flu shot this fall    [place], [place] back for  a check up in 4 months    Low-Sodium Eating Plan Sodium, which is an element that makes up salt, helps you maintain a healthy balance of fluids in your body. Too much sodium can increase your blood pressure and cause fluid and waste to be held in your body. Your health care provider or dietitian may recommend following this plan if you have high blood pressure (hypertension), kidney disease, liver disease, or heart failure. Eating less sodium can help lower your blood pressure, reduce swelling, and protect your heart, liver, and kidneys. What are tips for following this plan? Reading food labels The Nutrition Facts label lists the amount of sodium in one serving of the food. If you eat more than one serving, you must multiply the listed amount of sodium by the number of servings. Choose foods with less than 140 mg of sodium per serving. Avoid foods with 300 mg of sodium or more per serving. Shopping  Look for lower-sodium products, often labeled as "low-sodium" or "no salt added." Always check the sodium content, even if foods are labeled as "unsalted" or "no salt added." Buy fresh foods. Avoid canned foods and pre-made or frozen meals. Avoid canned, cured, or processed meats. Buy breads that have less than 80 mg of sodium per slice. Cooking  Eat more home-cooked food and less restaurant, buffet, and fast food. Avoid adding salt when cooking. Use salt-free seasonings or herbs instead of table salt or sea salt. Check with your health care provider or pharmacist before using salt substitutes. Cook with  plant-based oils, such as canola, sunflower, or olive oil. Meal planning When eating at a restaurant, ask that your food be prepared with less salt or no salt, if possible. Avoid dishes labeled as brined, pickled, cured, smoked, or made with soy sauce, miso, or teriyaki sauce. Avoid foods that contain MSG (monosodium glutamate). MSG is sometimes added to [place] food, bouillon, and some canned foods. Make meals that can be grilled, baked, poached, roasted, or steamed. These are generally made with less sodium. General information Most people on this plan should limit their sodium intake to 1,500-2,000 mg (milligrams) of sodium each day. What foods should I eat? Fruits Fresh, frozen, or canned fruit. Fruit juice. Vegetables Fresh or frozen vegetables. "No salt added" canned vegetables. "No salt added" tomato sauce and paste. Low-sodium or reduced-sodium tomato and vegetable juice. Grains Low-sodium cereals, including oats, puffed wheat and rice, and shredded wheat. Low-sodium crackers. Unsalted rice. Unsalted pasta. Low-sodium bread. Whole-grain breads and whole-grain pasta. Meats and other proteins Fresh or frozen (no salt added) meat, poultry, seafood, and fish. Low-sodium canned tuna and salmon. Unsalted nuts. Dried peas, beans, and lentils without added salt. Unsalted canned beans. Eggs. Unsalted nut butters. Dairy Milk. Soy milk. Cheese that is naturally low in sodium, such as ricotta cheese, fresh mozzarella, or Swiss cheese. Low-sodium or reduced-sodium cheese. Cream cheese. Yogurt. Seasonings and condiments Fresh and dried herbs and spices. Salt-free seasonings. Low-sodium mustard and ketchup. Sodium-free salad dressing. Sodium-free light mayonnaise. Fresh  or refrigerated horseradish. Lemon juice. Vinegar. Other foods Homemade, reduced-sodium, or low-sodium soups. Unsalted popcorn and pretzels. Low-salt or salt-free chips. The items listed above may not be a complete list of foods and  beverages you can eat. Contact a dietitian for more information. What foods should I avoid? Vegetables Sauerkraut, pickled vegetables, and relishes. Olives. [place] fries. Onion rings. Regular canned vegetables (not low-sodium or reduced-sodium). Regular canned tomato sauce and paste (not low-sodium or reduced-sodium). Regular tomato and vegetable juice (not low-sodium or reduced-sodium). Frozen vegetables in sauces. Grains Instant hot cereals. Bread stuffing, pancake, and biscuit mixes. Croutons. Seasoned rice or pasta mixes. Noodle soup cups. Boxed or frozen macaroni and cheese. Regular salted crackers. Self-rising flour. Meats and other proteins Meat or fish that is salted, canned, smoked, spiced, or pickled. Precooked or cured meat, such as sausages or meat loaves. Berniece Salines. Ham. Pepperoni. Hot dogs. Corned beef. Chipped beef. Salt pork. Jerky. Pickled herring. Anchovies and sardines. Regular canned tuna. Salted nuts. Dairy Processed cheese and cheese spreads. Hard cheeses. Cheese curds. Blue cheese. Feta cheese. String cheese. Regular cottage cheese. Buttermilk. Canned milk. Fats and oils Salted butter. Regular margarine. Ghee. Bacon fat. Seasonings and condiments Onion salt, garlic salt, seasoned salt, table salt, and sea salt. Canned and packaged gravies. Worcestershire sauce. Tartar sauce. Barbecue sauce. Teriyaki sauce. Soy sauce, including reduced-sodium. Steak sauce. Fish sauce. Oyster sauce. Cocktail sauce. Horseradish that you find on the shelf. Regular ketchup and mustard. Meat flavorings and tenderizers. Bouillon cubes. Hot sauce. Pre-made or packaged marinades. Pre-made or packaged taco seasonings. Relishes. Regular salad dressings. Salsa. Other foods Salted popcorn and pretzels. Corn chips and puffs. Potato and tortilla chips. Canned or dried soups. Pizza. Frozen entrees and pot pies. The items listed above may not be a complete list of foods and beverages you should avoid. Contact a  dietitian for more information. Summary Eating less sodium can help lower your blood pressure, reduce swelling, and protect your heart, liver, and kidneys. Most people on this plan should limit their sodium intake to 1,500-2,000 mg (milligrams) of sodium each day. Canned, boxed, and frozen foods are high in sodium. Restaurant foods, fast foods, and pizza are also very high in sodium. You also get sodium by adding salt to food. Try to cook at home, eat more fresh fruits and vegetables, and eat less fast food and canned, processed, or prepared foods. This information is not intended to replace advice given to you by your health care provider. Make sure you discuss any questions you have with your health care provider. Document Revised: 10/19/2019 Document Reviewed: 08/15/2019 Elsevier Patient Education  2022 [employer].

## 2021-06-05 DIAGNOSIS — I10 Essential (primary) hypertension: Secondary | ICD-10-CM | POA: Insufficient documentation

## 2021-06-05 NOTE — Assessment & Plan Note (Signed)
HTN: Diagnosed today based on ambulatory BPs, chart review, BP readings at the [facility]. Explained the patient the treatment: Low-salt diet, increase physical activity, start amlodipine, watch for edema.  Monitor BPs, goals provided. Vaccine advice provided as well. RTC 4 months

## 2021-07-27 ENCOUNTER — Other Ambulatory Visit: Payer: Self-pay | Admitting: Internal Medicine

## 2021-08-31 ENCOUNTER — Other Ambulatory Visit: Payer: Self-pay | Admitting: Internal Medicine

## 2021-10-05 ENCOUNTER — Ambulatory Visit: Payer: 59 | Admitting: Internal Medicine

## 2021-10-05 ENCOUNTER — Encounter: Payer: Self-pay | Admitting: Internal Medicine

## 2021-10-14 ENCOUNTER — Ambulatory Visit: Payer: 59 | Admitting: Internal Medicine

## 2021-10-14 ENCOUNTER — Encounter: Payer: Self-pay | Admitting: Internal Medicine

## 2021-10-14 VITALS — BP 126/76 | HR 76 | Temp 97.7°F | Resp 18 | Ht 76.0 in | Wt 253.0 lb

## 2021-10-14 DIAGNOSIS — Z23 Encounter for immunization: Secondary | ICD-10-CM | POA: Diagnosis not present

## 2021-10-14 DIAGNOSIS — I10 Essential (primary) hypertension: Secondary | ICD-10-CM

## 2021-10-14 DIAGNOSIS — R399 Unspecified symptoms and signs involving the genitourinary system: Secondary | ICD-10-CM

## 2021-10-14 DIAGNOSIS — E291 Testicular hypofunction: Secondary | ICD-10-CM | POA: Diagnosis not present

## 2021-10-14 DIAGNOSIS — M25571 Pain in right ankle and joints of right foot: Secondary | ICD-10-CM | POA: Diagnosis not present

## 2021-10-14 MED ORDER — TAMSULOSIN HCL 0.4 MG PO CAPS: ORAL_CAPSULE | ORAL | 1 refills | Status: DC

## 2021-10-14 MED ORDER — ACYCLOVIR 400 MG PO TABS: 400.0000 mg | ORAL_TABLET | Freq: Two times a day (BID) | ORAL | 1 refills | Status: DC

## 2021-10-14 MED ORDER — AMLODIPINE BESYLATE 5 MG PO TABS: ORAL_TABLET | ORAL | 1 refills | Status: DC

## 2021-10-14 NOTE — Progress Notes (Signed)
Subjective:    Patient ID: Dylan Hatfield, male    DOB: 17-Nov-1965, 56 y.o.   MRN: [id number]  DOS:  10/14/2021 Type of visit - description: f/u HTN: Good compliance with amlodipine. Has improved his diet, low salt. Feels great, no chest pain no difficulty breathing No edema No headache Ambulatory BPs very good.  LUTS: Long history of LUTS, slightly worse lately, describes urinary urgency and some frequency, mostly in the morning after his morning coffee. Urinate as frequently as once an hour. Denies dysuria or gross hematuria.  BP Readings from Last 3 Encounters:  10/14/21 126/76  06/03/21 (!) 144/78  04/09/21 (!) 150/92    Review of Systems See above   Past Medical History:  Diagnosis Date   Arthritis    Chronic back pain    bilateral L5/S1 transforaminal lumbar epidural steroid injections, Dr. [name]   DDD (degenerative disc disease), lumbosacral    Lumbosacral spondylosis    Sleep apnea    on CPAP, dx ~ 2008     Past Surgical History:  Procedure Laterality Date   VASECTOMY      Current Outpatient Medications  Medication Instructions   acyclovir (ZOVIRAX) 400 mg, Oral, 2 times daily   amLODipine (NORVASC) 5 MG tablet TAKE 1 TABLET(5 MG) BY MOUTH DAILY   HYDROcodone-acetaminophen (NORCO) 10-325 MG per tablet 1 tablet, As needed   ibuprofen (ADVIL) 200 mg, Every 6 hours PRN   tamsulosin (FLOMAX) 0.4 MG CAPS capsule TAKE 1 CAPSULE(0.4 MG) BY MOUTH DAILY AFTER SUPPER   TESTOSTERONE NA Other, Left and right hip       Objective:   Physical Exam BP 126/76 (BP Location: Left Arm, Patient Position: Sitting, Cuff Size: Normal)    Pulse 76    Temp 97.7 F (36.5 C) (Oral)    Resp 18    Ht 6\' 4"  (1.93 m)    Wt 253 lb (114.8 kg)    SpO2 97%    BMI 30.80 kg/m  General:   Well developed, NAD, BMI noted.  HEENT:  Normocephalic . Face symmetric, atraumatic Abdomen:  Not distended, soft, non-tender. No rebound or rigidity. DRE: Normal sphincter tone, brown stool,  prostate normal, no increase in size, no nodular or tender. Skin: Not pale. Not jaundice Lower extremities: no pretibial edema bilaterally  Neurologic:  alert & oriented X3.  Speech normal, gait appropriate for age and unassisted Psych--  Cognition and judgment appear intact.  Cooperative with normal attention span and concentration.  Behavior appropriate. No anxious or depressed appearing.     Assessment    Assessment  OSA   Dx 2008, on CPAP MSK:DJD. Trigger fingers  Chronic back pain-- sees pain mngmt  LUTS -nocturia. Bony mass deformity of the chest wall, XR 06-2014 -- benign changes Lipoma: At the mid back, diameter 10 cm    Palpitations: saw cards (echo 2018) On testosterone (as of 11-2019, prescribed elsewhere)  PLAN   HTN: On amlodipine, ambulatory BPs are very good, has improved his lifestyle, BP today is great.  Check a BMP and CBC. LUTS: the first time he c/o about this was 12/18/2019, at that time he was prescribed Flomax which helped. Tells me today that symptoms are somewhat more noticeable particularly in the morning. All this in the context of patient taking testosterone from another provider. Recommend to avoid bladder irritants such as caffeine in the morning to see if that improves his symptoms DRE today essentially normal.  We will check a UA, urine culture, PSA.  Further advised with results.  See next. Testosterone supplements, hypogonadism? He gets testosterone pellets elsewhere.We had a long discussion of HRT risks, rec to be seen by endocrinology to see if he truly qualifies for testosterone.  Will arrange a referral. Ankle pain, R: Still an issue, referred to sports medicine. RTC physical exam by April    This visit occurred during the SARS-CoV-2 public health emergency.  Safety protocols were in place, including screening questions prior to the visit, additional usage of staff PPE, and extensive cleaning of exam room while observing appropriate contact  time as indicated for disinfecting solutions.

## 2021-10-14 NOTE — Patient Instructions (Addendum)
Check the  blood pressure regularly BP GOAL is between 110/65 and  135/85. If it is consistently higher or lower, let me know   GO TO THE LAB : Get the blood work     [place], [place] back for a physical exam in April

## 2021-10-15 LAB — URINALYSIS, ROUTINE W REFLEX MICROSCOPIC
Bilirubin Urine: NEGATIVE
Hgb urine dipstick: NEGATIVE
Ketones, ur: NEGATIVE
Leukocytes,Ua: NEGATIVE
Nitrite: NEGATIVE
RBC / HPF: NONE SEEN (ref 0–?)
Specific Gravity, Urine: 1.015 (ref 1.000–1.030)
Total Protein, Urine: NEGATIVE
Urine Glucose: NEGATIVE
Urobilinogen, UA: 0.2 (ref 0.0–1.0)
WBC, UA: NONE SEEN (ref 0–?)
pH: 7 (ref 5.0–8.0)

## 2021-10-15 LAB — BASIC METABOLIC PANEL
BUN: 27 mg/dL — ABNORMAL HIGH (ref 6–23)
CO2: 30 mEq/L (ref 19–32)
Calcium: 9.9 mg/dL (ref 8.4–10.5)
Chloride: 101 mEq/L (ref 96–112)
Creatinine, Ser: 1.21 mg/dL (ref 0.40–1.50)
GFR: 67.02 mL/min (ref >60.00)
Glucose, Bld: 73 mg/dL (ref 70–99)
Potassium: 4.5 mEq/L (ref 3.5–5.1)
Sodium: 140 mEq/L (ref 135–145)

## 2021-10-15 LAB — CBC WITH DIFFERENTIAL/PLATELET
Basophils Absolute: 0.1 10*3/uL (ref 0.0–0.1)
Basophils Relative: 0.9 % (ref 0.0–3.0)
Eosinophils Absolute: 0.2 10*3/uL (ref 0.0–0.7)
Eosinophils Relative: 2.1 % (ref 0.0–5.0)
HCT: 51.9 % (ref 39.0–52.0)
Hemoglobin: 17.6 g/dL — ABNORMAL HIGH (ref 13.0–17.0)
Lymphocytes Relative: 20 % (ref 12.0–46.0)
Lymphs Abs: 1.9 10*3/uL (ref 0.7–4.0)
MCHC: 34 g/dL (ref 30.0–36.0)
MCV: 90.1 fl (ref 78.0–100.0)
Monocytes Absolute: 0.7 10*3/uL (ref 0.1–1.0)
Monocytes Relative: 7.2 % (ref 3.0–12.0)
Neutro Abs: 6.7 10*3/uL (ref 1.4–7.7)
Neutrophils Relative %: 69.8 % (ref 43.0–77.0)
Platelets: 216 10*3/uL (ref 150.0–400.0)
RBC: 5.76 Mil/uL (ref 4.22–5.81)
RDW: 14.3 % (ref 11.5–15.5)
WBC: 9.5 10*3/uL (ref 4.0–10.5)

## 2021-10-15 LAB — URINE CULTURE
MICRO NUMBER:: 12886373
Result:: NO GROWTH
SPECIMEN QUALITY:: ADEQUATE

## 2021-10-15 LAB — PSA: PSA: 1.23 ng/mL (ref 0.10–4.00)

## 2021-10-15 NOTE — Assessment & Plan Note (Signed)
HTN: On amlodipine, ambulatory BPs are very good, has improved his lifestyle, BP today is great.  Check a BMP and CBC. LUTS: the first time he c/o about this was 12/18/2019, at that time he was prescribed Flomax which helped. Tells me today that symptoms are somewhat more noticeable particularly in the morning. All this in the context of patient taking testosterone from another provider. Recommend to avoid bladder irritants such as caffeine in the morning to see if that improves his symptoms DRE today essentially normal.  We will check a UA, urine culture, PSA. Further advised with results.  See next. Testosterone supplements, hypogonadism? He gets testosterone pellets elsewhere.We had a long discussion of HRT risks, rec to be seen by endocrinology to see if he truly qualifies for testosterone.  Will arrange a referral. Ankle pain, R: Still an issue, referred to sports medicine. RTC physical exam by April

## 2021-11-05 ENCOUNTER — Ambulatory Visit: Payer: 59 | Admitting: Family Medicine

## 2021-11-05 ENCOUNTER — Encounter: Payer: Self-pay | Admitting: Family Medicine

## 2021-11-05 VITALS — BP 140/78 | Ht 76.0 in | Wt 256.0 lb

## 2021-11-05 DIAGNOSIS — M958 Other specified acquired deformities of musculoskeletal system: Secondary | ICD-10-CM

## 2021-11-05 DIAGNOSIS — M1812 Unilateral primary osteoarthritis of first carpometacarpal joint, left hand: Secondary | ICD-10-CM

## 2021-11-05 MED ORDER — DICLOFENAC SODIUM 2 % EX SOLN: 1.0000 "application " | Freq: Two times a day (BID) | CUTANEOUS | 2 refills | Status: DC

## 2021-11-05 NOTE — Assessment & Plan Note (Addendum)
Acute on chronic in nature.  Has tried medications and different insoles.  Has mediates x-ray with normal imaging on 6/7.  Has been under greater than 6 weeks of physician directed home exercise therapy. -Counseled on home exercise therapy and supportive care. -Orthotics today.  May add scaphoid pads. -MRI of the right ankle to evaluate for osteochondral defect and presurgical planning. -Could consider lab work to check for inflammatory changes.

## 2021-11-05 NOTE — Assessment & Plan Note (Addendum)
Acutely occurring.  Pain is occurring at the base of the thumb.  Likely related to the [facility] joint. -Counseled on home exercise therapy and supportive care. -CMC brace. - pennsaid  -Could consider injection or occupational therapy.

## 2021-11-05 NOTE — Progress Notes (Signed)
°  Dylan Hatfield - 56 y.o. male MRN [id number]  Date of birth: 1966/09/03  SUBJECTIVE:  Including CC & ROS.  No chief complaint on file.   Dylan Hatfield is a 56 y.o. male that is presenting with acute on chronic right ankle pain.  The pain began to acutely get worse about a month ago.  The pain is over the anterior aspect of the joint.  It is worse with walking and occurs intermittently.  He is also having left thumb joint pain.  Review of note from 1/18 shows he was counseled on supportive care. Independent review of the right ankle x-ray from 6/6 shows no acute changes. Independent review of the ultrasound of the right ankle from 6/15 shows increased inflammatory changes  Review of Systems See HPI   HISTORY: Past Medical, Surgical, Social, and Family History Reviewed & Updated per EMR.   Pertinent Historical Findings include:  Past Medical History:  Diagnosis Date   Arthritis    Chronic back pain    bilateral L5/S1 transforaminal lumbar epidural steroid injections, Dr. [name]   DDD (degenerative disc disease), lumbosacral    Lumbosacral spondylosis    Sleep apnea    on CPAP, dx ~ 2008     Past Surgical History:  Procedure Laterality Date   VASECTOMY       PHYSICAL EXAM:  VS: BP 140/78 (BP Location: Left Arm, Patient Position: Sitting)    Ht 6\' 4"  (1.93 m)    Wt 256 lb (116.1 kg)    BMI 31.16 kg/m  Physical Exam Gen: NAD, alert, cooperative with exam, well-appearing MSK:  Neurovascularly intact    Patient was fitted for a standard, cushioned, semi-rigid orthotic. The orthotic was heated and afterward the patient stood on the orthotic blank positioned on the orthotic stand. The patient was positioned in subtalar neutral position and 10 degrees of ankle dorsiflexion in a weight bearing stance. After completion of molding, a stable base was applied to the orthotic blank. The blank was ground to a stable position for weight bearing. Size: 12 Pairs: 2 Base:  cork Additional Posting and Padding: None The patient ambulated these, and they were very comfortable.      ASSESSMENT & PLAN:   Osteochondral defect of ankle Acute on chronic in nature.  Has tried medications and different insoles.  Has mediates x-ray with normal imaging on 6/7.  Has been under greater than 6 weeks of physician directed home exercise therapy. -Counseled on home exercise therapy and supportive care. -Orthotics today.  May add scaphoid pads. -MRI of the right ankle to evaluate for osteochondral defect and presurgical planning. -Could consider lab work to check for inflammatory changes.   Arthritis of carpometacarpal (CMC) joint of left thumb Acutely occurring.  Pain is occurring at the base of the thumb.  Likely related to the [facility] joint. -Counseled on home exercise therapy and supportive care. -CMC brace. - pennsaid  -Could consider injection or occupational therapy.

## 2021-11-05 NOTE — Patient Instructions (Signed)
Good to see you Please try the orthotics  Please ice as needed  Please call [phone] to schedule the MRi   Please send me a message in MyChart with any questions or updates.  We'll setup a virtual visit once the MRi is resulted.   --Dr. Raeford Razor

## 2021-12-04 ENCOUNTER — Other Ambulatory Visit: Payer: 59

## 2021-12-12 ENCOUNTER — Other Ambulatory Visit: Payer: Self-pay

## 2021-12-12 ENCOUNTER — Ambulatory Visit
Admission: RE | Admit: 2021-12-12 | Discharge: 2021-12-12 | Disposition: A | Discharge status: Home / Self Care | Payer: 59 | Source: Ambulatory Visit | Attending: Family Medicine | Admitting: Family Medicine

## 2021-12-12 DIAGNOSIS — M958 Other specified acquired deformities of musculoskeletal system: Secondary | ICD-10-CM

## 2021-12-16 ENCOUNTER — Telehealth (INDEPENDENT_AMBULATORY_CARE_PROVIDER_SITE_OTHER): Payer: 59 | Admitting: Family Medicine

## 2021-12-16 DIAGNOSIS — M19071 Primary osteoarthritis, right ankle and foot: Secondary | ICD-10-CM | POA: Diagnosis not present

## 2021-12-16 DIAGNOSIS — M25571 Pain in right ankle and joints of right foot: Secondary | ICD-10-CM

## 2021-12-16 NOTE — Assessment & Plan Note (Signed)
Having pain over the lateral compartment to suggest sinus tarsi. ?-Can follow-up for injection. ?

## 2021-12-16 NOTE — Assessment & Plan Note (Signed)
MRI was revealing for degenerative changes as well as ganglion cyst.  He does have some pain in the midfoot as well as swelling.  We have completed custom orthotics for him. ?-Counseled on home exercise therapy and supportive care. ?-Could consider midfoot arch strap or injection. ?

## 2021-12-16 NOTE — Progress Notes (Signed)
Virtual Visit via Video Note ? ?I connected with Dylan Hatfield on 12/16/21 at  2:50 PM EDT by a video enabled telemedicine application and verified that I am speaking with the correct person using two identifiers. ? ?Location: ?Patient: work ?Provider: office ?  ?I discussed the limitations of evaluation and management by telemedicine and the availability of in person appointments. The patient expressed understanding and agreed to proceed. ? ?History of Present Illness: ? ?Dylan Hatfield is a 56 year old male that is following up after the MRI of his right ankle and foot.  This was demonstrating arthritis within his midfoot as well as edema to suggest sinus Tarsi syndrome. ?  ?Observations/Objective: ? ? ?Assessment and Plan: ? ?Sinus tarsi syndrome of right ankle: ?Having pain over the lateral compartment to suggest sinus tarsi. ?-Can follow-up for injection. ? ?Midfoot arthritis of right foot: ?MRI was revealing for degenerative changes as well as ganglion cyst.  He does have some pain in the midfoot as well as swelling.  We have completed custom orthotics for him. ?-Counseled on home exercise therapy and supportive care. ?-Could consider midfoot arch strap or injection. ? ?Follow Up Instructions: ? ?  ?I discussed the assessment and treatment plan with the patient. The patient was provided an opportunity to ask questions and all were answered. The patient agreed with the plan and demonstrated an understanding of the instructions. ?  ?The patient was advised to call back or seek an in-person evaluation if the symptoms worsen or if the condition fails to improve as anticipated. ? ? ? ?Clearance Coots, MD ? ? ?

## 2021-12-21 ENCOUNTER — Encounter: Payer: Self-pay | Admitting: Family Medicine

## 2021-12-21 ENCOUNTER — Encounter: Payer: Self-pay | Admitting: Internal Medicine

## 2021-12-21 ENCOUNTER — Ambulatory Visit (INDEPENDENT_AMBULATORY_CARE_PROVIDER_SITE_OTHER): Payer: 59 | Admitting: Internal Medicine

## 2021-12-21 ENCOUNTER — Telehealth: Payer: Self-pay

## 2021-12-21 ENCOUNTER — Ambulatory Visit: Payer: 59 | Admitting: Family Medicine

## 2021-12-21 ENCOUNTER — Ambulatory Visit: Payer: Self-pay

## 2021-12-21 VITALS — BP 130/88 | Ht 76.0 in | Wt 259.0 lb

## 2021-12-21 VITALS — BP 126/84 | HR 68 | Temp 97.8°F | Resp 16 | Ht 76.0 in | Wt 259.5 lb

## 2021-12-21 DIAGNOSIS — Z0000 Encounter for general adult medical examination without abnormal findings: Secondary | ICD-10-CM

## 2021-12-21 DIAGNOSIS — M25571 Pain in right ankle and joints of right foot: Secondary | ICD-10-CM

## 2021-12-21 DIAGNOSIS — I10 Essential (primary) hypertension: Secondary | ICD-10-CM | POA: Diagnosis not present

## 2021-12-21 DIAGNOSIS — Z1211 Encounter for screening for malignant neoplasm of colon: Secondary | ICD-10-CM

## 2021-12-21 DIAGNOSIS — Z1159 Encounter for screening for other viral diseases: Secondary | ICD-10-CM

## 2021-12-21 DIAGNOSIS — B351 Tinea unguium: Secondary | ICD-10-CM

## 2021-12-21 LAB — CBC WITH DIFFERENTIAL/PLATELET
Basophils Absolute: 0 10*3/uL (ref 0.0–0.1)
Basophils Relative: 0.4 % (ref 0.0–3.0)
Eosinophils Absolute: 0.2 10*3/uL (ref 0.0–0.7)
Eosinophils Relative: 2.2 % (ref 0.0–5.0)
HCT: 53.6 % — ABNORMAL HIGH (ref 39.0–52.0)
Hemoglobin: 18.2 g/dL (ref 13.0–17.0)
Lymphocytes Relative: 18 % (ref 12.0–46.0)
Lymphs Abs: 1.7 10*3/uL (ref 0.7–4.0)
MCHC: 34 g/dL (ref 30.0–36.0)
MCV: 89.4 fl (ref 78.0–100.0)
Monocytes Absolute: 0.6 10*3/uL (ref 0.1–1.0)
Monocytes Relative: 6.1 % (ref 3.0–12.0)
Neutro Abs: 6.8 10*3/uL (ref 1.4–7.7)
Neutrophils Relative %: 73.3 % (ref 43.0–77.0)
Platelets: 201 10*3/uL (ref 150.0–400.0)
RBC: 6 Mil/uL — ABNORMAL HIGH (ref 4.22–5.81)
RDW: 13.9 % (ref 11.5–15.5)
WBC: 9.3 10*3/uL (ref 4.0–10.5)

## 2021-12-21 LAB — HEPATIC FUNCTION PANEL
ALT: 35 U/L (ref 0–53)
AST: 20 U/L (ref 0–37)
Albumin: 4.8 g/dL (ref 3.5–5.2)
Alkaline Phosphatase: 44 U/L (ref 39–117)
Bilirubin, Direct: 0.1 mg/dL (ref 0.0–0.3)
Total Bilirubin: 0.8 mg/dL (ref 0.2–1.2)
Total Protein: 6.6 g/dL (ref 6.0–8.3)

## 2021-12-21 LAB — LIPID PANEL
Cholesterol: 206 mg/dL — ABNORMAL HIGH (ref 0–200)
HDL: 35.6 mg/dL — ABNORMAL LOW (ref >39.00)
NonHDL: 170.44
Total CHOL/HDL Ratio: 6
Triglycerides: 227 mg/dL — ABNORMAL HIGH (ref 0.0–149.0)
VLDL: 45.4 mg/dL — ABNORMAL HIGH (ref 0.0–40.0)

## 2021-12-21 LAB — LDL CHOLESTEROL, DIRECT: Direct LDL: 147 mg/dL

## 2021-12-21 MED ORDER — METHYLPREDNISOLONE ACETATE 40 MG/ML IJ SUSP
40.0000 mg | Freq: Once | INTRAMUSCULAR | Status: AC
  Administered 2021-12-21: 40 mg via INTRA_ARTICULAR

## 2021-12-21 NOTE — Telephone Encounter (Signed)
Noted, thank you

## 2021-12-21 NOTE — Progress Notes (Signed)
?  LONG BRIMAGE - 56 y.o. male MRN [id number]  Date of birth: [birth year] ? ?SUBJECTIVE:  Including CC & ROS.  ?No chief complaint on file. ? ? ?Dylan Hatfield is a 56 y.o. male that is presenting with worsening right ankle pain.  MRI was found to have sinus tarsi syndrome.  He is here for an injection.. ? ? ?Review of Systems ?See HPI  ? ?HISTORY: Past Medical, Surgical, Social, and Family History Reviewed & Updated per EMR.   ?Pertinent Historical Findings include: ? ?Past Medical History:  ?Diagnosis Date  ? Arthritis   ? Chronic back pain   ? bilateral L5/S1 transforaminal lumbar epidural steroid injections, Dr. [name]  ? DDD (degenerative disc disease), lumbosacral   ? Lumbosacral spondylosis   ? Sleep apnea   ? on CPAP, dx ~ 2008   ? ? ?Past Surgical History:  ?Procedure Laterality Date  ? VASECTOMY    ? ? ? ?PHYSICAL EXAM:  ?VS: BP 130/88 (BP Location: Left Arm, Patient Position: Sitting)   Ht '6\' 4"'$  (1.93 m)   Wt 259 lb (117.5 kg)   BMI 31.53 kg/m?  ?Physical Exam ?Gen: NAD, alert, cooperative with exam, well-appearing ?MSK:  ?Neurovascularly intact   ? ? ?Aspiration/Injection Procedure Note ?Dylan Hatfield ?1965-10-06 ? ?Procedure: Injection ?Indications: Right ankle pain ? ?Procedure Details ?Consent: Risks of procedure as well as the alternatives and risks of each were explained to the (patient/caregiver).  Consent for procedure obtained. ?Time Out: Verified patient identification, verified procedure, site/side was marked, verified correct patient position, special equipment/implants available, medications/allergies/relevent history reviewed, required imaging and test results available.  Performed.  The area was cleaned with iodine and alcohol swabs.   ? ?The right sinus tarsi was injected using 1 cc's of 40 mg Depo-Medrol and 2 cc's of 0.25% bupivacaine with with a 25 1 1/2" needle.  Ultrasound was used. Images were obtained in short views showing the injection.   ? ? ?A sterile dressing was  applied. ? ?Patient did tolerate procedure well. ? ? ? ? ?ASSESSMENT & PLAN:  ? ?Sinus tarsi syndrome of right ankle ?Acutely occurring.  He has gotten improvement with orthotics. ?-Injection today. ? ? ? ? ?

## 2021-12-21 NOTE — Assessment & Plan Note (Signed)
-  Td 2020    ?- shingrix x2 ?- COVID VAX booster d/w pt  ?CCS: Colonoscopy 01/2017, + polyps, 5 years, refer to GI ?Prostate cancer screening:  ?Was on testosterone, no LUTS, DRE and PSA 09-2021 : WNL. ?Labs: LFTs, FLP, CBC, hep C ?Diet, exercise: Doing well with physical activity and diet  ?advance directives discussed ?  ? ?  ?

## 2021-12-21 NOTE — Assessment & Plan Note (Signed)
Here for CPX ?HTN: Seems well controlled.  No change ?MSK: Continue with some ankle pain, follow-up by sports medicine. ?Testosterone supplementation, hypogonadism? ?Seen last visit, testosterone supplements DC at my request, not taking them for the last 3 to 4 months , energy level and sex drive about the same, perhaps slightly low?  No major difference.  I referred to Endo but that did not happen. ?Plan: Will await few more months before I refer him to Endo or recheck levels.  Recheck a CBC. ?Dystrophic R first toenail: Back in 2021, nail culture showed Fusarium Species, treated with Lamisil, no better.  Referr to dermatology. ?RTC 5 to 6 months ?

## 2021-12-21 NOTE — Patient Instructions (Signed)
Good to see you ?Please try ice   ?Please send me a message in MyChart with any questions or updates.  ?Please see me back in 4 weeks or as needed if better.  ? ?--Dr. Raeford Razor ? ?

## 2021-12-21 NOTE — Progress Notes (Addendum)
? ?Subjective:  ? ? Patient ID: Dylan Hatfield, male    DOB: 25-Apr-1966, 56 y.o.   MRN: [id number] ? ?DOS:  12/21/2021 ?Type of visit - description: cpx ? ?Here for CPX ?Chart is reviewed ? ?Review of Systems ?Still has issues w/ ankle discomfort and nail dystrophy  ? ?Other than above, a 14 point review of systems is negative  ? ?  ? ?Past Medical History:  ?Diagnosis Date  ? Arthritis   ? Chronic back pain   ? bilateral L5/S1 transforaminal lumbar epidural steroid injections, Dr. [name]  ? DDD (degenerative disc disease), lumbosacral   ? Lumbosacral spondylosis   ? Sleep apnea   ? on CPAP, dx ~ 2008   ? ? ?Past Surgical History:  ?Procedure Laterality Date  ? VASECTOMY    ? ?Social History  ? ?Socioeconomic History  ? Marital status: Married  ?  Spouse name: Not on file  ? Number of children: 2  ? Years of education: Not on file  ? Highest education level: Not on file  ?Occupational History  ? Occupation: Occupation-- Chief Financial Officer    ?  Employer: [employer]  ?Tobacco Use  ? Smoking status: Former  ? Smokeless tobacco: Former  ? Tobacco comments:  ?  1 ppd, quit 2003  ?Vaping Use  ? Vaping Use: Never used  ?Substance and Sexual Activity  ? Alcohol use: No  ? Drug use: No  ?  Types: Hydrocodone  ? Sexual activity: Yes  ?Other Topics Concern  ? Not on file  ?Social History Narrative  ? Daughter: graduated from college  ? Son : graduated HS, works w/ father   ? ?Social Determinants of Health  ? ?Financial Resource Strain: Not on file  ?Food Insecurity: Not on file  ?Transportation Needs: Not on file  ?Physical Activity: Not on file  ?Stress: Not on file  ?Social Connections: Not on file  ?Intimate Partner Violence: Not on file  ? ? ?Current Outpatient Medications  ?Medication Instructions  ? acyclovir (ZOVIRAX) 400 mg, Oral, 2 times daily  ? amLODipine (NORVASC) 5 MG tablet TAKE 1 TABLET(5 MG) BY MOUTH DAILY  ? Diclofenac Sodium (PENNSAID) 2 % SOLN 1 application., Transdermal, 2 times daily  ?  HYDROcodone-acetaminophen (NORCO) 10-325 MG per tablet 1 tablet, As needed  ? ibuprofen (ADVIL) 200 mg, Every 6 hours PRN  ? tamsulosin (FLOMAX) 0.4 MG CAPS capsule TAKE 1 CAPSULE(0.4 MG) BY MOUTH DAILY AFTER SUPPER  ? ? ?   ?Objective:  ? Physical Exam ?BP 126/84 (BP Location: Left Arm, Patient Position: Sitting, Cuff Size: Normal)   Pulse 68   Temp 97.8 ?F (36.6 ?C) (Oral)   Resp 16   Ht '6\' 4"'$  (1.93 m)   Wt 259 lb 8 oz (117.7 kg)   SpO2 95%   BMI 31.59 kg/m?  ?General: ?Well developed, NAD, BMI noted ?Neck: No  thyromegaly  ?HEENT:  ?Normocephalic . Face symmetric, atraumatic ?Lungs:  ?CTA B ?Normal respiratory effort, no intercostal retractions, no accessory muscle use. ?Heart: RRR,  no murmur.  ?Abdomen:  ?Not distended, soft, non-tender. No rebound or rigidity.   ?Lower extremities: no pretibial edema bilaterally  ?Skin: 1st  R toe nail continue to be dystrophic ?Neurologic:  ?alert & oriented X3.  ?Speech normal, gait appropriate for age and unassisted ?Strength symmetric and appropriate for age.  ?Psych: ?Cognition and judgment appear intact.  ?Cooperative with normal attention span and concentration.  ?Behavior appropriate. ?No anxious or depressed appearing. ? ?   ?  Assessment   ? ?Assessment  ?OSA   Dx 2008, on CPAP ?MSK:DJD. Trigger fingers  ?Chronic back pain-- sees pain mngmt  ?LUTS -nocturia. ?Bony mass deformity of the chest wall, XR 06-2014 -- benign changes ?Lipoma: At the mid back, diameter 10 cm    ?Palpitations: saw cards (echo 2018) ?Rx testosterone elsewhere, DC ~ 08-2021 ? ?PLAN   ?Here for CPX ?HTN: Seems well controlled.  No change ?MSK: Continue with some ankle pain, follow-up by sports medicine. ?Testosterone supplementation, hypogonadism? ?Seen last visit, testosterone supplements DC at my request, not taking them for the last 3 to 4 months , energy level and sex drive about the same, perhaps slightly low?  No major difference.  I referred to Endo but that did not happen. ?Plan:  Will await few more months before I refer him to Endo or recheck levels.  Recheck a CBC. ?Dystrophic R first toenail: Back in 2021, nail culture showed Fusarium Species, treated with Lamisil, no better.  Referr to dermatology. ?RTC 5 to 6 months ? ? ?This visit occurred during the SARS-CoV-2 public health emergency.  Safety protocols were in place, including screening questions prior to the visit, additional usage of staff PPE, and extensive cleaning of exam room while observing appropriate contact time as indicated for disinfecting solutions.  ? ?

## 2021-12-21 NOTE — Assessment & Plan Note (Signed)
Acutely occurring.  He has gotten improvement with orthotics. ?-Injection today. ? ?

## 2021-12-21 NOTE — Patient Instructions (Signed)
Call gastroenterology at [phone]  [phone] for a colonoscopy ? ? ?Check the  blood pressure regularly ?BP GOAL is between 110/65 and  135/85. ?If it is consistently higher or lower, let me know ? ?  ?GO TO THE LAB : Get the blood work   ? ? ?[place], [place] ?Come back for   a check up in 5-6 months  ? ? ?"Living will", "Health Care Power of attorney": Advanced care planning ? ? ? ? ?(If you already have a living will or healthcare power of attorney, please bring the copy to be scanned in your chart.) ? ?Advance care planning is a process that supports adults in  understanding and sharing their preferences regarding future medical care.  ? ?The patient's preferences are recorded in documents called Advance Directives.    ?Advanced directives are completed (and can be modified at any time) while the patient is in full mental capacity.  ? ?The documentation should be available at all times to the patient, the family and the healthcare providers.  ?Bring in a copy to be scanned in your chart is an excellent idea and is recommended  ? ?This legal documents direct treatment decision making and/or appoint a surrogate to make the decision if the patient is not capable to do so.  ? ? ?Advance directives can be documented in many types of formats,  documents have names such as:  ?Lliving will  ?Durable power of attorney for healthcare (healthcare proxy or healthcare power of attorney)  ?Combined directives  ?Physician orders for life-sustaining treatment  ?  ?More information at: ? ?[email or web address]  ?

## 2021-12-21 NOTE — Telephone Encounter (Signed)
CRITICAL VALUE STICKER ? ?CRITICAL VALUE: Hemoglobin 18.2   ? ?RECEIVER (on-site recipient of call): Cressie Betzler, RMA ? ?DATE & TIME NOTIFIED: 12/21/2021 at 12:37 pm ? ?MESSENGER (representative from lab): Delorise Jackson  ? ?MD NOTIFIED: Kathlene November, MD  ? ?TIME OF NOTIFICATION: 12:44 pm ? ?RESPONSE:   ?

## 2021-12-22 LAB — HEPATITIS C ANTIBODY
Hepatitis C Ab: NONREACTIVE
SIGNAL TO CUT-OFF: <0.02 (ref <1.00)

## 2021-12-23 NOTE — Addendum Note (Signed)
Addended by: Kathlene November E on: 12/23/2021 02:55 PM ? ? Modules accepted: Orders ? ?

## 2021-12-30 ENCOUNTER — Other Ambulatory Visit: Payer: Self-pay | Admitting: Internal Medicine

## 2021-12-30 MED ORDER — ACYCLOVIR 400 MG PO TABS: 400.0000 mg | ORAL_TABLET | Freq: Two times a day (BID) | ORAL | 1 refills | Status: DC

## 2022-02-11 ENCOUNTER — Encounter: Payer: Self-pay | Admitting: Gastroenterology

## 2022-03-31 ENCOUNTER — Ambulatory Visit (AMBULATORY_SURGERY_CENTER): Payer: Self-pay | Admitting: *Deleted

## 2022-03-31 VITALS — Ht 76.0 in | Wt 262.8 lb

## 2022-03-31 DIAGNOSIS — Z86010 Personal history of colonic polyps: Secondary | ICD-10-CM

## 2022-03-31 MED ORDER — NA SULFATE-K SULFATE-MG SULF 17.5-3.13-1.6 GM/177ML PO SOLN: 1.0000 | Freq: Once | ORAL | 0 refills | Status: AC

## 2022-03-31 NOTE — Progress Notes (Signed)
No egg or soy allergy known to patient  No issues known to pt with past sedation with any surgeries or procedures Patient denies ever being told they had issues or difficulty with intubation  No FH of Malignant Hyperthermia Pt is not on diet pills Pt is not on  home 02  Pt is not on blood thinners  Pt denies issues with constipation  No A fib or A flutter Have any cardiac testing pending--  SUPREP Coupon to pt in PV today , Code to Pharmacy and  NO PA's for preps discussed with pt In PV today  Discussed with pt there will be an out-of-pocket cost for prep and that varies from $0 to 70 +  dollars - pt verbalized understanding  Pt instructed to use Singlecare.com or GoodRx for a price reduction on prep

## 2022-04-01 ENCOUNTER — Other Ambulatory Visit: Payer: Self-pay | Admitting: Internal Medicine

## 2022-04-01 MED ORDER — ACYCLOVIR 400 MG PO TABS: 400.0000 mg | ORAL_TABLET | Freq: Two times a day (BID) | ORAL | 1 refills | Status: DC

## 2022-04-05 ENCOUNTER — Ambulatory Visit (INDEPENDENT_AMBULATORY_CARE_PROVIDER_SITE_OTHER): Payer: 59 | Admitting: Family Medicine

## 2022-04-05 ENCOUNTER — Ambulatory Visit: Payer: Self-pay

## 2022-04-05 ENCOUNTER — Encounter: Payer: Self-pay | Admitting: Family Medicine

## 2022-04-05 VITALS — BP 118/68 | Ht 76.0 in | Wt 262.0 lb

## 2022-04-05 DIAGNOSIS — M25411 Effusion, right shoulder: Secondary | ICD-10-CM | POA: Diagnosis not present

## 2022-04-05 DIAGNOSIS — M25511 Pain in right shoulder: Secondary | ICD-10-CM

## 2022-04-05 DIAGNOSIS — M67813 Other specified disorders of tendon, right shoulder: Secondary | ICD-10-CM | POA: Diagnosis not present

## 2022-04-05 MED ORDER — PREDNISONE 5 MG PO TABS: ORAL_TABLET | ORAL | 0 refills | Status: DC

## 2022-04-05 NOTE — Assessment & Plan Note (Signed)
Has acute effusion but symptoms seem less likely to be associated with the [facility] joint. -Counseled on home exercise therapy and supportive care. -Could consider an injection.

## 2022-04-05 NOTE — Progress Notes (Signed)
  Dylan Hatfield - 56 y.o. male MRN [id number]  Date of birth: 18-Mar-1966  SUBJECTIVE:  Including CC & ROS.  No chief complaint on file.   Dylan Hatfield is a 56 y.o. male that is presenting with acute on chronic right shoulder pain.  The pain is anterior nature.  Feels the pain when he is doing any lifting or pulling.  No specific injury.   Review of Systems See HPI   HISTORY: Past Medical, Surgical, Social, and Family History Reviewed & Updated per EMR.   Pertinent Historical Findings include:  Past Medical History:  Diagnosis Date   Arthritis    Chronic back pain    bilateral L5/S1 transforaminal lumbar epidural steroid injections, Dr. [name]   DDD (degenerative disc disease), lumbosacral    Hypertension    Lumbosacral spondylosis    Sleep apnea    on CPAP, dx ~ 2008     Past Surgical History:  Procedure Laterality Date   COLONOSCOPY     POLYPECTOMY     VASECTOMY       PHYSICAL EXAM:  VS: BP 118/68 (BP Location: Left Arm, Patient Position: Sitting)   Ht '6\' 4"'$  (1.93 m)   Wt 262 lb (118.8 kg)   BMI 31.89 kg/m  Physical Exam Gen: NAD, alert, cooperative with exam, well-appearing MSK:  Neurovascularly intact    Limited ultrasound: Right shoulder:  Chronic changes within the biceps tendon. Normal-appearing subscapularis. No acute changes of the supraspinatus. Effusion degenerative changes of the [facility] joint.  Summary: Biceps tendinosis  Ultrasound and interpretation by Clearance Coots, MD    ASSESSMENT & PLAN:   Biceps tendinosis of right shoulder Acute on chronic in nature.  Symptoms seem more consistent with a biceps tendon at that extends into the joint.  No glenohumeral effusion. -Counseled on home exercise therapy and supportive care. -Prednisone. -Could consider injection or physical therapy.   Effusion of acromioclavicular joint, right Has acute effusion but symptoms seem less likely to be associated with the [facility] joint. -Counseled on home exercise  therapy and supportive care. -Could consider an injection.

## 2022-04-05 NOTE — Assessment & Plan Note (Signed)
Acute on chronic in nature.  Symptoms seem more consistent with a biceps tendon at that extends into the joint.  No glenohumeral effusion. -Counseled on home exercise therapy and supportive care. -Prednisone. -Could consider injection or physical therapy.

## 2022-04-05 NOTE — Patient Instructions (Signed)
Good to see you Please use heat before exercise and ice after  Please try the exercises   You could consider compression for your back  Please send me a message in MyChart with any questions or updates.  Please see me back before season starts.   --Dr. Raeford Razor

## 2022-04-21 ENCOUNTER — Encounter: Payer: 59 | Admitting: Gastroenterology

## 2022-04-23 ENCOUNTER — Encounter: Payer: Self-pay | Admitting: Gastroenterology

## 2022-04-28 ENCOUNTER — Ambulatory Visit (AMBULATORY_SURGERY_CENTER): Payer: 59 | Admitting: Gastroenterology

## 2022-04-28 ENCOUNTER — Encounter: Payer: Self-pay | Admitting: Gastroenterology

## 2022-04-28 VITALS — BP 131/71 | HR 51 | Temp 98.7°F | Resp 9 | Ht 76.0 in | Wt 262.8 lb

## 2022-04-28 DIAGNOSIS — Z09 Encounter for follow-up examination after completed treatment for conditions other than malignant neoplasm: Secondary | ICD-10-CM

## 2022-04-28 DIAGNOSIS — Z86010 Personal history of colonic polyps: Secondary | ICD-10-CM | POA: Diagnosis not present

## 2022-04-28 MED ORDER — SODIUM CHLORIDE 0.9 % IV SOLN: 500.0000 mL | Freq: Once | INTRAVENOUS | Status: DC

## 2022-04-28 NOTE — Progress Notes (Signed)
VS completed by [place].   Pt's states no medical or surgical changes since previsit or office visit.  

## 2022-04-28 NOTE — Progress Notes (Signed)
[facility] History and Physical   Primary Care Physician:  Colon Branch, MD   Reason for Procedure:  History of adenomatous colon polyps  Plan:    Surveillance colonoscopy with possible interventions as needed     HPI: Dylan Hatfield is a very pleasant 56 y.o. male here for surveillance colonoscopy. Denies any nausea, vomiting, abdominal pain, melena or bright red blood per rectum  The risks and benefits as well as alternatives of endoscopic procedure(s) have been discussed and reviewed. All questions answered. The patient agrees to proceed.    Past Medical History:  Diagnosis Date   Arthritis    Chronic back pain    bilateral L5/S1 transforaminal lumbar epidural steroid injections, Dr. [name]   DDD (degenerative disc disease), lumbosacral    Hypertension    Lumbosacral spondylosis    Sleep apnea    on CPAP, dx ~ 2008     Past Surgical History:  Procedure Laterality Date   COLONOSCOPY     POLYPECTOMY     VASECTOMY      Prior to Admission medications   Medication Sig Start Date End Date Taking? Authorizing Provider  acyclovir (ZOVIRAX) 400 MG tablet Take 1 tablet (400 mg total) by mouth 2 (two) times daily. 04/01/22  Yes Paz, Alda Berthold, MD  amLODipine (NORVASC) 5 MG tablet TAKE 1 TABLET(5 MG) BY MOUTH DAILY 10/14/21  Yes Paz, Alda Berthold, MD  Multiple Vitamins-Minerals (MULTIVITAMIN ADULTS PO) Take by mouth daily. TAKE ONE   Yes [provider]  Omega-3 Fatty Acids (FISH OIL PO) Take by mouth in the morning and at bedtime. ONE CAPSULE   Yes [provider]  OVER THE COUNTER MEDICATION daily. NATTOKNAISE TAKE ONE DAILY   Yes [provider]  OVER THE COUNTER MEDICATION in the morning and at bedtime. TAKE ONE GNC TRI-FLEX   Yes [provider]  tamsulosin (FLOMAX) 0.4 MG CAPS capsule TAKE 1 CAPSULE(0.4 MG) BY MOUTH DAILY AFTER SUPPER 10/14/21  Yes Paz, Alda Berthold, MD  Diclofenac Sodium (PENNSAID) 2 % SOLN Place 1 application onto the skin 2  (two) times daily. Patient not taking: Reported on 03/31/2022 11/05/21   Rosemarie Ax, MD  HYDROcodone-acetaminophen [facility]) 10-325 MG per tablet Take 1 tablet by mouth as needed. Patient not taking: Reported on 03/31/2022    [provider]  ibuprofen (ADVIL) 200 MG tablet Take 200 mg by mouth every 6 (six) hours as needed. Patient not taking: Reported on 03/31/2022    [provider]  predniSONE (DELTASONE) 5 MG tablet Take 6 pills for first day, 5 pills second day, 4 pills third day, 3 pills fourth day, 2 pills the fifth day, and 1 pill sixth day. Patient not taking: Reported on 04/28/2022 04/05/22   Rosemarie Ax, MD    Current Outpatient Medications  Medication Sig Dispense Refill   acyclovir (ZOVIRAX) 400 MG tablet Take 1 tablet (400 mg total) by mouth 2 (two) times daily. 180 tablet 1   amLODipine (NORVASC) 5 MG tablet TAKE 1 TABLET(5 MG) BY MOUTH DAILY 90 tablet 1   Multiple Vitamins-Minerals (MULTIVITAMIN ADULTS PO) Take by mouth daily. TAKE ONE     Omega-3 Fatty Acids (FISH OIL PO) Take by mouth in the morning and at bedtime. ONE CAPSULE     OVER THE COUNTER MEDICATION daily. NATTOKNAISE TAKE ONE DAILY     OVER THE COUNTER MEDICATION in the morning and at bedtime. TAKE ONE GNC TRI-FLEX     tamsulosin (FLOMAX) 0.4  MG CAPS capsule TAKE 1 CAPSULE(0.4 MG) BY MOUTH DAILY AFTER SUPPER 90 capsule 1   Diclofenac Sodium (PENNSAID) 2 % SOLN Place 1 application onto the skin 2 (two) times daily. (Patient not taking: Reported on 03/31/2022) 112 g 2   HYDROcodone-acetaminophen (NORCO) 10-325 MG per tablet Take 1 tablet by mouth as needed. (Patient not taking: Reported on 03/31/2022)     ibuprofen (ADVIL) 200 MG tablet Take 200 mg by mouth every 6 (six) hours as needed. (Patient not taking: Reported on 03/31/2022)     predniSONE (DELTASONE) 5 MG tablet Take 6 pills for first day, 5 pills second day, 4 pills third day, 3 pills fourth day, 2 pills the fifth day, and 1 pill sixth day.  (Patient not taking: Reported on 04/28/2022) 21 tablet 0   Current Facility-Administered Medications  Medication Dose Route Frequency Provider Last Rate Last Admin   0.9 %  sodium chloride infusion  500 mL Intravenous Once Mauri Pole, MD        Allergies as of 04/28/2022   (No Known Allergies)    Family History  Problem Relation Age of Onset   Lung cancer Father    Arrhythmia Maternal Aunt    Stroke Other        GM stroke 58   Breast cancer Other        GM   Diabetes Neg Hx    CAD Neg Hx    Colon cancer Neg Hx    Prostate cancer Neg Hx    Esophageal cancer Neg Hx    Rectal cancer Neg Hx    Stomach cancer Neg Hx    Colon polyps Neg Hx    Crohn's disease Neg Hx     Social History   Socioeconomic History   Marital status: Married    Spouse name: Not on file   Number of children: 2   Years of education: Not on file   Highest education level: Not on file  Occupational History   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[employer]  Tobacco Use   Smoking status: Former    Passive exposure: Past (FATHER SMOKED)   Smokeless tobacco: Former   Tobacco comments:    1 ppd, quit 2003  Vaping Use   Vaping Use: Never used  Substance and Sexual Activity   Alcohol use: No   Drug use: No    Types: Hydrocodone   Sexual activity: Yes  Other Topics Concern   Not on file  Social History Narrative   Daughter: graduated from college   Son : graduated HS, works w/ father    Social Determinants of Radio broadcast assistant Strain: Not on Art therapist Insecurity: Not on file  Transportation Needs: Not on file  Physical Activity: Not on file  Stress: Not on file  Social Connections: Not on file  Intimate Partner Violence: Not on file    Review of Systems:  All other review of systems negative except as mentioned in the HPI.  Physical Exam: Vital signs in last 24 hours: BP 133/69   Pulse (!) 54   Temp 98.7 F (37.1 C) (Temporal)    Ht '6\' 4"'$  (1.93 m)   Wt 262 lb 12.8 oz (119.2 kg)   SpO2 96%   BMI 31.99 kg/m  General:   Alert, NAD Lungs:  Clear .   Heart:  Regular rate and rhythm Abdomen:  Soft, nontender and nondistended. Neuro/Psych:  Alert and  cooperative. Normal mood and affect. A and O x 3  Reviewed labs, radiology imaging, old records and pertinent past GI work up  Patient is appropriate for planned procedure(s) and anesthesia in an ambulatory setting   K. Denzil Magnuson , MD [phone]

## 2022-04-28 NOTE — Patient Instructions (Signed)
   Handouts provided about hemorrhoids and diverticulosis.  Repeat colonoscopy in 10 years for surveillance.  YOU HAD AN ENDOSCOPIC PROCEDURE TODAY AT [facility]:   Refer to the procedure report that was given to you for any specific questions about what was found during the examination.  If the procedure report does not answer your questions, please call your gastroenterologist to clarify.  If you requested that your care partner not be given the details of your procedure findings, then the procedure report has been included in a sealed envelope for you to review at your convenience later.  YOU SHOULD EXPECT: Some feelings of bloating in the abdomen. Passage of more gas than usual.  Walking can help get rid of the air that was put into your GI tract during the procedure and reduce the bloating. If you had a lower endoscopy (such as a colonoscopy or flexible sigmoidoscopy) you may notice spotting of blood in your stool or on the toilet paper. If you underwent a bowel prep for your procedure, you may not have a normal bowel movement for a few days.  Please Note:  You might notice some irritation and congestion in your nose or some drainage.  This is from the oxygen used during your procedure.  There is no need for concern and it should clear up in a day or so.  SYMPTOMS TO REPORT IMMEDIATELY:  Following lower endoscopy (colonoscopy or flexible sigmoidoscopy):  Excessive amounts of blood in the stool  Significant tenderness or worsening of abdominal pains  Swelling of the abdomen that is new, acute  Fever of 100F or higher   For urgent or emergent issues, a gastroenterologist can be reached at any hour by calling [phone]. Do not use MyChart messaging for urgent concerns.    DIET:  We do recommend a small meal at first, but then you may proceed to your regular diet.  Drink plenty of fluids but you should avoid alcoholic beverages for 24 hours.  ACTIVITY:  You should  plan to take it easy for the rest of today and you should NOT DRIVE or use heavy machinery until tomorrow (because of the sedation medicines used during the test).    FOLLOW UP: Our staff will call the number listed on your records the next business day following your procedure.  We will call around 7:15- 8:00 am to check on you and address any questions or concerns that you may have regarding the information given to you following your procedure. If we do not reach you, we will leave a message.  If you develop any symptoms (ie: fever, flu-like symptoms, shortness of breath, cough etc.) before then, please call [phone].  If you test positive for Covid 19 in the 2 weeks post procedure, please call and report this information to [place].    If any biopsies were taken you will be contacted by phone or by letter within the next 1-3 weeks.  Please call us at [phone] if you have not heard about the biopsies in 3 weeks.    SIGNATURES/CONFIDENTIALITY: You and/or your care partner have signed paperwork which will be entered into your electronic medical record.  These signatures attest to the fact that that the information above on your After Visit Summary has been reviewed and is understood.  Full responsibility of the confidentiality of this discharge information lies with you and/or your care-partner.

## 2022-04-28 NOTE — Op Note (Signed)
[place] Patient Name: Dylan Hatfield Procedure Date: 04/28/2022 1:28 PM MRN: [id number] Endoscopist: Mauri Pole , MD Age: 56 Referring MD:  Date of Birth: 1965-10-19 Gender: Male Account #: [id number] Procedure:                Colonoscopy Indications:              High risk colon cancer surveillance: Personal                            history of colonic polyps, High risk colon cancer                            surveillance: Personal history of adenoma less than                            10 mm in size Medicines:                Monitored Anesthesia Care Procedure:                Pre-Anesthesia Assessment:                           - Prior to the procedure, a History and Physical                            was performed, and patient medications and                            allergies were reviewed. The patient's tolerance of                            previous anesthesia was also reviewed. The risks                            and benefits of the procedure and the sedation                            options and risks were discussed with the patient.                            All questions were answered, and informed consent                            was obtained. Prior Anticoagulants: The patient has                            taken no previous anticoagulant or antiplatelet                            agents. ASA Grade Assessment: III - A patient with                            severe systemic disease. After reviewing the risks  and benefits, the patient was deemed in                            satisfactory condition to undergo the procedure.                           After obtaining informed consent, the colonoscope                            was passed under direct vision. Throughout the                            procedure, the patient's blood pressure, pulse, and                            oxygen saturations were monitored  continuously. The                            0441 PCF-H190TL Slim SB Colonoscope was introduced                            through the anus and advanced to the the cecum,                            identified by appendiceal orifice and ileocecal                            valve. The colonoscopy was performed without                            difficulty. The patient tolerated the procedure                            well. The quality of the bowel preparation was                            good. The ileocecal valve, appendiceal orifice, and                            rectum were photographed. Scope In: 1:40:20 PM Scope Out: 2:01:26 PM Scope Withdrawal Time: 0 hours 9 minutes 46 seconds  Total Procedure Duration: 0 hours 21 minutes 6 seconds  Findings:                 The perianal and digital rectal examinations were                            normal.                           A few small-mouthed diverticula were found in the                            sigmoid colon and descending colon.  Non-bleeding external and internal hemorrhoids were                            found during retroflexion. The hemorrhoids were                            medium-sized. Complications:            No immediate complications. Estimated Blood Loss:     Estimated blood loss was minimal. Impression:               - Diverticulosis in the sigmoid colon and in the                            descending colon.                           - Non-bleeding external and internal hemorrhoids.                           - No specimens collected. Recommendation:           - Patient has a contact number available for                            emergencies. The signs and symptoms of potential                            delayed complications were discussed with the                            patient. Return to normal activities tomorrow.                            Written discharge instructions were  provided to the                            patient.                           - Resume previous diet.                           - Continue present medications.                           - Repeat colonoscopy in 10 years for surveillance                            based on pathology results. Mauri Pole, MD 04/28/2022 2:08:49 PM This report has been signed electronically.

## 2022-04-28 NOTE — Progress Notes (Signed)
Sedate, gd SR, tolerated procedure well, VSS, report to RN 

## 2022-04-29 ENCOUNTER — Telehealth: Payer: Self-pay

## 2022-04-29 NOTE — Telephone Encounter (Signed)
  Follow up Call-     04/28/2022    1:06 PM  Call back number  Post procedure Call Back phone  # [phone]  Permission to leave phone message Yes     Patient questions:  Do you have a fever, pain , or abdominal swelling? No. Pain Score  0 *  Have you tolerated food without any problems? Yes.    Have you been able to return to your normal activities? Yes.    Do you have any questions about your discharge instructions: Diet   No. Medications  No. Follow up visit  No.  Do you have questions or concerns about your Care? No.  Actions: * If pain score is 4 or above: No action needed, pain <4.

## 2022-04-30 ENCOUNTER — Other Ambulatory Visit: Payer: Self-pay | Admitting: Internal Medicine

## 2022-05-24 ENCOUNTER — Encounter: Payer: Self-pay | Admitting: *Deleted

## 2022-05-24 NOTE — Progress Notes (Unsigned)
PATIENT: Dylan Hatfield DOB: July 06, 1966  REASON FOR VISIT: follow up HISTORY FROM: patient PRIMARY NEUROLOGIST: Dr. Vickey Huger  Virtual Visit via Video Note  I connected with Dylan Hatfield on 05/25/22 at  3:00 PM EDT by a video enabled telemedicine application located remotely at [facility] and verified that I am speaking with the correct person using two identifiers who was located at their own home.   I discussed the limitations of evaluation and management by telemedicine and the availability of in person appointments. The patient expressed understanding and agreed to proceed.   PATIENT: Dylan Hatfield DOB: April 24, 1966  REASON FOR VISIT: follow up HISTORY FROM: patient  Chief Complaint  Patient presents with   WIRELESS DL     HISTORY OF PRESENT ILLNESS: Today 05/25/22:  Mr. Hagle is a 56 year old male with a history of obstructive sleep apnea on CPAP.  He returns today for follow-up.  He reports that his CPAP is working well.  He denies any new issues.  Continues to notice a benefit.  Returns today for an evaluation.    05/26/21: Mr. Fendt is a 56 year old male with a history of OSA.  He returns today for follow-up.  He reports that the CPAP is working well for him.  He states that there was a week that he did not use it because [place] the airport.  Otherwise he is doing well.  His download is below.    HISTORY 05/20/20:   Mr. Ille is a 56 year old male with a history of obstructive sleep apnea on CPAP.  His download indicates that he uses machine nightly for compliance of 100%.  He uses machine greater than 4 hours each night.  On average he uses his machine 8 hours and 3 minutes.  His residual AHI is 0.5 on 4 to 15 cm of water with EPR 3.  Leak in the 95th percentile is 1.6 L/min.  He reports that his mask is too little.  They do not make a larger size than the current style that he has.  He is requesting a mask refitting.  REVIEW OF  SYSTEMS: Out of a complete 14 system review of symptoms, the patient complains only of the following symptoms, and all other reviewed systems are negative.  ALLERGIES: No Known Allergies  HOME MEDICATIONS: Outpatient Medications Prior to Visit  Medication Sig Dispense Refill   acyclovir (ZOVIRAX) 400 MG tablet Take 1 tablet (400 mg total) by mouth 2 (two) times daily. 180 tablet 1   amLODipine (NORVASC) 5 MG tablet TAKE 1 TABLET(5 MG) BY MOUTH DAILY 90 tablet 1   Diclofenac Sodium (PENNSAID) 2 % SOLN Place 1 application onto the skin 2 (two) times daily. (Patient not taking: Reported on 03/31/2022) 112 g 2   HYDROcodone-acetaminophen (NORCO) 10-325 MG per tablet Take 1 tablet by mouth as needed. (Patient not taking: Reported on 03/31/2022)     ibuprofen (ADVIL) 200 MG tablet Take 200 mg by mouth every 6 (six) hours as needed. (Patient not taking: Reported on 03/31/2022)     Multiple Vitamins-Minerals (MULTIVITAMIN ADULTS PO) Take by mouth daily. TAKE ONE     Omega-3 Fatty Acids (FISH OIL PO) Take by mouth in the morning and at bedtime. ONE CAPSULE     OVER THE COUNTER MEDICATION daily. NATTOKNAISE TAKE ONE DAILY     OVER THE COUNTER MEDICATION in the morning and at bedtime. TAKE ONE GNC TRI-FLEX     predniSONE (  DELTASONE) 5 MG tablet Take 6 pills for first day, 5 pills second day, 4 pills third day, 3 pills fourth day, 2 pills the fifth day, and 1 pill sixth day. (Patient not taking: Reported on 04/28/2022) 21 tablet 0   tamsulosin (FLOMAX) 0.4 MG CAPS capsule TAKE 1 CAPSULE(0.4 MG) BY MOUTH DAILY AFTER SUPPER 90 capsule 1   No facility-administered medications prior to visit.    PAST MEDICAL HISTORY: Past Medical History:  Diagnosis Date   Arthritis    Chronic back pain    bilateral L5/S1 transforaminal lumbar epidural steroid injections, Dr. Pernell Dupre   DDD (degenerative disc disease), lumbosacral    Hypertension    Lumbosacral spondylosis    Sleep apnea    on CPAP, dx ~ 2008     PAST  SURGICAL HISTORY: Past Surgical History:  Procedure Laterality Date   COLONOSCOPY     POLYPECTOMY     VASECTOMY      FAMILY HISTORY: Family History  Problem Relation Age of Onset   Lung cancer Father    Arrhythmia Maternal Aunt    Stroke Other        GM stroke 5   Breast cancer Other        GM   Diabetes Neg Hx    CAD Neg Hx    Colon cancer Neg Hx    Prostate cancer Neg Hx    Esophageal cancer Neg Hx    Rectal cancer Neg Hx    Stomach cancer Neg Hx    Colon polyps Neg Hx    Crohn's disease Neg Hx     SOCIAL HISTORY: Social History   Socioeconomic History   Marital status: Married    Spouse name: Not on file   Number of children: 2   Years of education: Not on file   Highest education level: Not on file  Occupational History   Occupation: Occupation-- Theatre manager: [employer]  Tobacco Use   Smoking status: Former    Passive exposure: Past (FATHER SMOKED)   Smokeless tobacco: Former   Tobacco comments:    1 ppd, quit 2003  Vaping Use   Vaping Use: Never used  Substance and Sexual Activity   Alcohol use: No   Drug use: No    Types: Hydrocodone   Sexual activity: Yes  Other Topics Concern   Not on file  Social History Narrative   Daughter: graduated from college   Son : graduated HS, works w/ father    Social Determinants of Corporate investment banker Strain: Not on file  Food Insecurity: Not on file  Transportation Needs: Not on file  Physical Activity: Not on file  Stress: Not on file  Social Connections: Not on file  Intimate Partner Violence: Not on file      PHYSICAL EXAM Generalized: Well developed, in no acute distress   Neurological examination  Mentation: Alert oriented to time, place, history taking. Follows all commands speech and language fluent Cranial nerve II-XII:Extraocular movements were full. Facial symmetry noted.[name] Head turning and shoulder shrug  were normal and symmetric.  DIAGNOSTIC  DATA (LABS, IMAGING, TESTING) - I reviewed patient records, labs, notes, testing and imaging myself where available.  Lab Results  Component Value Date   WBC 9.3 12/21/2021   HGB 18.2 Repeated and verified X2. (HH) 12/21/2021   HCT 53.6 (H) 12/21/2021   MCV 89.4 12/21/2021   PLT 201.0 12/21/2021  Component Value Date/Time   NA 140 10/14/2021 1534   K 4.5 10/14/2021 1534   CL 101 10/14/2021 1534   CO2 30 10/14/2021 1534   GLUCOSE 73 10/14/2021 1534   BUN 27 (H) 10/14/2021 1534   CREATININE 1.21 10/14/2021 1534   CALCIUM 9.9 10/14/2021 1534   PROT 6.6 12/21/2021 0829   ALBUMIN 4.8 12/21/2021 0829   AST 20 12/21/2021 0829   ALT 35 12/21/2021 0829   ALKPHOS 44 12/21/2021 0829   BILITOT 0.8 12/21/2021 0829   GFRNONAA 86.09 12/10/2009 0941   GFRAA 94 12/05/2008 0753   Lab Results  Component Value Date   CHOL 206 (H) 12/21/2021   HDL 35.60 (L) 12/21/2021   LDLCALC 118 (H) 03/05/2021   LDLDIRECT 147.0 12/21/2021   TRIG 227.0 (H) 12/21/2021   CHOLHDL 6 12/21/2021   Lab Results  Component Value Date   HGBA1C 5.0 12/18/2019   No results found for: "VITAMINB12" Lab Results  Component Value Date   TSH 2.70 12/18/2020      ASSESSMENT AND PLAN 56 y.o. year old male  has a past medical history of Arthritis, Chronic back pain, DDD (degenerative disc disease), lumbosacral, Hypertension, Lumbosacral spondylosis, and Sleep apnea. here with:  OSA on CPAP  CPAP compliance excellent Residual AHI is good Encouraged patient to continue using CPAP nightly and > 4 hours each night F/U in 1 year or sooner if needed    Butch Penny, MSN, NP-C 05/25/2022, 2:56 PM [facility] [address] [place], [place] [address] [phone]

## 2022-05-25 ENCOUNTER — Telehealth (INDEPENDENT_AMBULATORY_CARE_PROVIDER_SITE_OTHER): Payer: 59 | Admitting: Adult Health

## 2022-05-25 DIAGNOSIS — Z9989 Dependence on other enabling machines and devices: Secondary | ICD-10-CM

## 2022-05-25 DIAGNOSIS — G4733 Obstructive sleep apnea (adult) (pediatric): Secondary | ICD-10-CM | POA: Diagnosis not present

## 2022-06-29 ENCOUNTER — Ambulatory Visit: Payer: 59 | Admitting: Internal Medicine

## 2022-07-02 ENCOUNTER — Encounter: Payer: Self-pay | Admitting: Internal Medicine

## 2022-07-02 ENCOUNTER — Ambulatory Visit: Payer: 59 | Admitting: Internal Medicine

## 2022-07-02 VITALS — BP 132/82 | HR 65 | Temp 98.2°F | Resp 16 | Ht 76.0 in | Wt 265.1 lb

## 2022-07-02 DIAGNOSIS — R222 Localized swelling, mass and lump, trunk: Secondary | ICD-10-CM | POA: Diagnosis not present

## 2022-07-02 DIAGNOSIS — I10 Essential (primary) hypertension: Secondary | ICD-10-CM | POA: Diagnosis not present

## 2022-07-02 DIAGNOSIS — G4733 Obstructive sleep apnea (adult) (pediatric): Secondary | ICD-10-CM

## 2022-07-02 DIAGNOSIS — B009 Herpesviral infection, unspecified: Secondary | ICD-10-CM

## 2022-07-02 DIAGNOSIS — Z23 Encounter for immunization: Secondary | ICD-10-CM

## 2022-07-02 DIAGNOSIS — D751 Secondary polycythemia: Secondary | ICD-10-CM | POA: Diagnosis not present

## 2022-07-02 LAB — FERRITIN: Ferritin: 105.2 ng/mL (ref 22.0–322.0)

## 2022-07-02 LAB — CBC WITH DIFFERENTIAL/PLATELET
Basophils Absolute: 0 10*3/uL (ref 0.0–0.1)
Basophils Relative: 0.5 % (ref 0.0–3.0)
Eosinophils Absolute: 0.2 10*3/uL (ref 0.0–0.7)
Eosinophils Relative: 2.4 % (ref 0.0–5.0)
HCT: 44.9 % (ref 39.0–52.0)
Hemoglobin: 15.4 g/dL (ref 13.0–17.0)
Lymphocytes Relative: 25.1 % (ref 12.0–46.0)
Lymphs Abs: 1.6 10*3/uL (ref 0.7–4.0)
MCHC: 34.4 g/dL (ref 30.0–36.0)
MCV: 88 fl (ref 78.0–100.0)
Monocytes Absolute: 0.4 10*3/uL (ref 0.1–1.0)
Monocytes Relative: 6.3 % (ref 3.0–12.0)
Neutro Abs: 4.2 10*3/uL (ref 1.4–7.7)
Neutrophils Relative %: 65.7 % (ref 43.0–77.0)
Platelets: 204 10*3/uL (ref 150.0–400.0)
RBC: 5.11 Mil/uL (ref 4.22–5.81)
RDW: 13.8 % (ref 11.5–15.5)
WBC: 6.4 10*3/uL (ref 4.0–10.5)

## 2022-07-02 LAB — BASIC METABOLIC PANEL
BUN: 20 mg/dL (ref 6–23)
CO2: 26 mEq/L (ref 19–32)
Calcium: 9.5 mg/dL (ref 8.4–10.5)
Chloride: 103 mEq/L (ref 96–112)
Creatinine, Ser: 0.96 mg/dL (ref 0.40–1.50)
GFR: 88.03 mL/min (ref >60.00)
Glucose, Bld: 107 mg/dL — ABNORMAL HIGH (ref 70–99)
Potassium: 4.3 mEq/L (ref 3.5–5.1)
Sodium: 138 mEq/L (ref 135–145)

## 2022-07-02 LAB — IRON: Iron: 111 ug/dL (ref 42–165)

## 2022-07-02 MED ORDER — ACYCLOVIR 400 MG PO TABS: 400.0000 mg | ORAL_TABLET | Freq: Two times a day (BID) | ORAL | 1 refills | Status: DC

## 2022-07-02 NOTE — Progress Notes (Signed)
Subjective:    Patient ID: Dylan Hatfield, male    DOB: [birth year], 56 y.o.   MRN: [id number]  DOS:  07/02/2022 Type of visit - description: f/u  Here for routine follow-up. In general feels well. We talk about polycythemia.  He donates blood several times a year. Chart reviewed: Had a colonoscopy and saw the sleep apnea doctor. Noted a puffy area on the right chest for a while.  Not tender, no discharge.  Review of Systems See above   Past Medical History:  Diagnosis Date   Arthritis    Chronic back pain    bilateral L5/S1 transforaminal lumbar epidural steroid injections, Dr. [name]   DDD (degenerative disc disease), lumbosacral    Hypertension    Lumbosacral spondylosis    Sleep apnea    on CPAP, dx ~ 2008     Past Surgical History:  Procedure Laterality Date   COLONOSCOPY     POLYPECTOMY     VASECTOMY      Current Outpatient Medications  Medication Instructions   acyclovir (ZOVIRAX) 400 mg, Oral, 2 times daily   amLODipine (NORVASC) 5 MG tablet TAKE 1 TABLET(5 MG) BY MOUTH DAILY   Diclofenac Sodium (PENNSAID) 2 % SOLN 1 application , Transdermal, 2 times daily   HYDROcodone-acetaminophen (NORCO) 10-325 MG per tablet 1 tablet, As needed   ibuprofen (ADVIL) 200 mg, Every 6 hours PRN   Multiple Vitamins-Minerals (MULTIVITAMIN ADULTS PO) Oral, Daily, TAKE ONE   Omega-3 Fatty Acids (FISH OIL PO) Oral, 2 times daily, ONE CAPSULE   OVER THE COUNTER MEDICATION Daily, NATTOKNAISE TAKE ONE DAILY   OVER THE COUNTER MEDICATION 2 times daily, TAKE ONE GNC TRI-FLEX   predniSONE (DELTASONE) 5 MG tablet Take 6 pills for first day, 5 pills second day, 4 pills third day, 3 pills fourth day, 2 pills the fifth day, and 1 pill sixth day.   tamsulosin (FLOMAX) 0.4 MG CAPS capsule TAKE 1 CAPSULE(0.4 MG) BY MOUTH DAILY AFTER SUPPER       Objective:   Physical Exam Chest:       Comments: Area of puffiness per patient, area is palpated and exam is confirmatory however has symmetric  but less puffy area on the left. Skin is normal, no openings.  No obvious mass. BP 132/82   Pulse 65   Temp 98.2 F (36.8 C) (Oral)   Resp 16   Ht '6\' 4"'$  (1.93 m)   Wt 265 lb 2 oz (120.3 kg)   SpO2 95%   BMI 32.27 kg/m  General:   Well developed, NAD, BMI noted. HEENT:  Normocephalic . Face symmetric, atraumatic Lungs:  CTA B Normal respiratory effort, no intercostal retractions, no accessory muscle use. Heart: RRR,  no murmur.  Lower extremities: no pretibial edema bilaterally  Skin: Not pale. Not jaundice Neurologic:  alert & oriented X3.  Speech normal, gait appropriate for age and unassisted Psych--  Cognition and judgment appear intact.  Cooperative with normal attention span and concentration.  Behavior appropriate. No anxious or depressed appearing.      Assessment     Assessment  OSA   Dx 2008, on CPAP MSK:DJD. Trigger fingers  Chronic back pain-- sees pain mngmt  LUTS -nocturia. Bony mass deformity of the chest wall, XR 06-2014 -- benign changes Lipoma: At the mid back, diameter 10 cm    Palpitations: saw cards (echo 2018) Rx testosterone elsewhere, DC ~ 08-2021  PLAN   HTN: BP is great, continue amlodipine, check BMP OSA:  Was evaluated via video 05/25/2022. HSV: RF Zovirax Lump right chest?  See physical exam, exam is benign, we agreed on observation. Polycythemia: Hemoglobin has been elevated since last year, he donates platelets 5 or 6 times a year and whole  blood 4-5 times a year. Polycythemia could be secondary to testosterone supplement so at my request HRT was stopped earlier this year. Plan: Check CBC, iron, ferritin.  Has an appointment to see Endo for questionable hypogonadism Testosterone supplementation, hypogonadism?  Off HRT, see above, to see Endo soon [facility] Had a colonoscopy 04-2022. Flu shot today, advised to proceed with COVID-vaccine. RTC CPX 11-2022

## 2022-07-02 NOTE — Patient Instructions (Addendum)
Vaccines I recommend:  Covid booster  Check the  blood pressure regularly BP GOAL is between 110/65 and  135/85. If it is consistently higher or lower, let me know    GO TO THE LAB : Get the blood work     [place] back for a physical exam by March 2024

## 2022-07-02 NOTE — Assessment & Plan Note (Signed)
HTN: BP is great, continue amlodipine, check BMP OSA: Was evaluated via video 05/25/2022. HSV: RF Zovirax Lump right chest?  See physical exam, exam is benign, we agreed on observation. Polycythemia: Hemoglobin has been elevated since last year, he donates platelets 5 or 6 times a year and whole  blood 4-5 times a year. Polycythemia could be secondary to testosterone supplement so at my request HRT was stopped earlier this year. Plan: Check CBC, iron, ferritin.  Has an appointment to see Endo for questionable hypogonadism Testosterone supplementation, hypogonadism?  Off HRT, see above, to see Endo soon [facility] Had a colonoscopy 04-2022. Flu shot today, advised to proceed with COVID-vaccine. RTC CPX 11-2022

## 2022-07-15 ENCOUNTER — Other Ambulatory Visit: Payer: Self-pay | Admitting: Nurse Practitioner

## 2022-07-15 DIAGNOSIS — M5412 Radiculopathy, cervical region: Secondary | ICD-10-CM

## 2022-07-23 ENCOUNTER — Ambulatory Visit (INDEPENDENT_AMBULATORY_CARE_PROVIDER_SITE_OTHER): Payer: 59 | Admitting: Internal Medicine

## 2022-07-23 ENCOUNTER — Encounter: Payer: Self-pay | Admitting: Internal Medicine

## 2022-07-23 VITALS — BP 124/82 | HR 64 | Wt 267.0 lb

## 2022-07-23 DIAGNOSIS — R7989 Other specified abnormal findings of blood chemistry: Secondary | ICD-10-CM | POA: Diagnosis not present

## 2022-07-23 LAB — COMPREHENSIVE METABOLIC PANEL
ALT: 30 U/L (ref 0–53)
AST: 19 U/L (ref 0–37)
Albumin: 4.7 g/dL (ref 3.5–5.2)
Alkaline Phosphatase: 45 U/L (ref 39–117)
BUN: 23 mg/dL (ref 6–23)
CO2: 29 mEq/L (ref 19–32)
Calcium: 9.9 mg/dL (ref 8.4–10.5)
Chloride: 102 mEq/L (ref 96–112)
Creatinine, Ser: 0.89 mg/dL (ref 0.40–1.50)
GFR: 95.4 mL/min (ref >60.00)
Glucose, Bld: 103 mg/dL — ABNORMAL HIGH (ref 70–99)
Potassium: 4.4 mEq/L (ref 3.5–5.1)
Sodium: 139 mEq/L (ref 135–145)
Total Bilirubin: 0.7 mg/dL (ref 0.2–1.2)
Total Protein: 6.4 g/dL (ref 6.0–8.3)

## 2022-07-23 LAB — CBC
HCT: 45.6 % (ref 39.0–52.0)
Hemoglobin: 15.3 g/dL (ref 13.0–17.0)
MCHC: 33.6 g/dL (ref 30.0–36.0)
MCV: 87.8 fl (ref 78.0–100.0)
Platelets: 203 10*3/uL (ref 150.0–400.0)
RBC: 5.19 Mil/uL (ref 4.22–5.81)
RDW: 14.1 % (ref 11.5–15.5)
WBC: 7.3 10*3/uL (ref 4.0–10.5)

## 2022-07-23 LAB — T4, FREE: Free T4: 0.75 ng/dL (ref 0.60–1.60)

## 2022-07-23 LAB — PSA: PSA: 0.73 ng/mL (ref 0.10–4.00)

## 2022-07-23 LAB — TSH: TSH: 2.43 u[IU]/mL (ref 0.35–5.50)

## 2022-07-23 LAB — LUTEINIZING HORMONE: LH: 8.51 m[IU]/mL (ref 1.50–9.30)

## 2022-07-23 NOTE — Progress Notes (Unsigned)
Name: Dylan Hatfield  MRN/ DOB: [id number], 12-09-1965    Age/ Sex: 56 y.o., male    PCP: Colon Branch, MD   Reason for Endocrinology Evaluation: Hypogonadism     Date of Initial Endocrinology Evaluation: 07/23/2022     HPI: Mr. Dylan Hatfield is a 56 y.o. male with a past medical history of HTN and OSA. The patient presented for initial endocrinology clinic visit on 07/23/2022 for consultative assistance with his Hypogonadism.     Mr. Asbridge was referred here for further evaluation of low testosterone.  The patient was seen by [facility] earlier this year based on his wife's recommendations, and was started on testosterone pellets.  Per patient he believes his testosterone level was ~150-180   The patient has history of erythrocytosis with blood donations in the past, but his erythrocytosis worsened while on testosterone therapy requiring more hydration and more blood donation  He did not feel " bad" per se prior to testosterone therapy but he did notice improvement in his energy level and sleep pattern once he was on testosterone  He denies any erectile dysfunction or decreased libido Denies  nipple discharge  No prior exposure to XRT No personal hx or FH of prostate cancer  Dees not smoke  Nocturia 2-4   Currently sleep pretty well  He has OSA on CPAP  He is on hydrocodone for back issues but takes  he takes as needed    HISTORY:  Past Medical History:  Past Medical History:  Diagnosis Date   Arthritis    Chronic back pain    bilateral L5/S1 transforaminal lumbar epidural steroid injections, Dr. [name]   DDD (degenerative disc disease), lumbosacral    Hypertension    Lumbosacral spondylosis    Sleep apnea    on CPAP, dx ~ 2008    Past Surgical History:  Past Surgical History:  Procedure Laterality Date   COLONOSCOPY     POLYPECTOMY     VASECTOMY      Social History:  reports that he has quit smoking. He has been exposed to tobacco smoke. He has quit  using smokeless tobacco. He reports that he does not drink alcohol and does not use drugs. Family History: family history includes Arrhythmia in his maternal aunt; Breast cancer in an other family member; Lung cancer in his father; Stroke in an other family member.   HOME MEDICATIONS: Allergies as of 07/23/2022   No Known Allergies      Medication List        Accurate as of July 23, 2022  8:10 AM. If you have any questions, ask your nurse or doctor.          STOP taking these medications    diclofenac Sodium 2 % Soln Commonly known as: Pennsaid Stopped by: Dorita Sciara, MD       TAKE these medications    acyclovir 400 MG tablet Commonly known as: ZOVIRAX Take 1 tablet (400 mg total) by mouth 2 (two) times daily. What changed: when to take this   amLODipine 5 MG tablet Commonly known as: NORVASC TAKE 1 TABLET(5 MG) BY MOUTH DAILY   cyclobenzaprine 10 MG tablet Commonly known as: FLEXERIL Take 10 mg by mouth at bedtime.   FISH OIL PO Take by mouth in the morning and at bedtime. ONE CAPSULE   gabapentin 100 MG capsule Commonly known as: NEURONTIN Take 100 mg by mouth 3 (three) times daily.   HYDROcodone-acetaminophen 10-325  MG tablet Commonly known as: NORCO Take 1 tablet by mouth as needed.   ibuprofen 200 MG tablet Commonly known as: ADVIL Take 200 mg by mouth every 6 (six) hours as needed.   MULTIVITAMIN ADULTS PO Take by mouth daily. TAKE ONE   OVER THE COUNTER MEDICATION daily. NATTOKNAISE TAKE ONE DAILY   OVER THE COUNTER MEDICATION in the morning and at bedtime. TAKE ONE GNC TRI-FLEX   tamsulosin 0.4 MG Caps capsule Commonly known as: FLOMAX TAKE 1 CAPSULE(0.4 MG) BY MOUTH DAILY AFTER SUPPER          REVIEW OF SYSTEMS: A comprehensive ROS was conducted with the patient and is negative except as per HPI and below:  ROS     OBJECTIVE:  VS: BP 124/82 (BP Location: Left Arm, Patient Position: Sitting, Cuff Size: Large)    Pulse 64   Wt 267 lb (121.1 kg)   SpO2 99%   BMI 32.50 kg/m    Wt Readings from Last 3 Encounters:  07/23/22 267 lb (121.1 kg)  07/02/22 265 lb 2 oz (120.3 kg)  04/28/22 262 lb 12.8 oz (119.2 kg)     EXAM: General: Pt appears well and is in NAD  Eyes: External eye exam normal without stare, lid lag or exophthalmos.  EOM intact.  PERRL.  Neck: General: Supple without adenopathy. Thyroid: Thyroid size normal.  No goiter or nodules appreciated. No thyroid bruit.  Lungs: Clear with good BS bilat with no rales, rhonchi, or wheezes  Heart: Auscultation: RRR.  Abdomen: Normoactive bowel sounds, soft, nontender, without masses or organomegaly palpable  Genital: Normal external penile exam, testicular volume 20 mL bilaterally   Extremities:  BL LE: No pretibial edema normal ROM and strength.  Mental Status: Judgment, insight: Intact Orientation: Oriented to time, place, and person Mood and affect: No depression, anxiety, or agitation     DATA REVIEWED: ***    ASSESSMENT/PLAN/RECOMMENDATIONS:   Low testosterone: -No records available today -We will proceed with testosterone, TFTs, prolactin, LH, PSA, CBC, and CMP checked today -We discussed side effects of testosterone therapy such as erythropoiesis, worsening of sleep apnea that is severe and untreated,  Prostate volumes and serum PSA increase in response to testosterone treatment which might increase BPH and worsen urinary outflow obstruction as well as prostate cancer risk.  - We also discussed there is a possibility of increased cardiovascular risk associated with testosterone use.    F/U in 4 months   Signed electronically by: Mack Guise, MD  [facility]  [address] [place], [place] [address] Phone: [phone] FAX: [phone]   CC: Colon Branch, [address] [place] [address] Phone: [phone] Fax: [phone]   Return  to Endocrinology clinic as below: Future Appointments  Date Time Provider [facility]  08/01/2022  8:20 AM GI-315 MR 3 [id number] GI-[facility] [address] [facility]  11/25/2022  9:10 AM Farrin Shadle, Melanie Crazier, MD [facility] None  12/28/2022  8:40 AM Colon Branch, MD [facility]  05/24/2023  2:30 PM Ward Givens, NP [facility] None

## 2022-07-26 LAB — PROLACTIN: Prolactin: 3.6 ng/mL (ref 2.0–18.0)

## 2022-07-26 LAB — TESTOSTERONE, TOTAL, LC/MS/MS: Testosterone, Total, LC-MS-MS: 241 ng/dL — ABNORMAL LOW (ref 250–1100)

## 2022-07-27 ENCOUNTER — Encounter: Payer: Self-pay | Admitting: Internal Medicine

## 2022-07-27 MED ORDER — TESTOSTERONE 20.25 MG/ACT (1.62%) TD GEL: 1.0000 | TRANSDERMAL | 5 refills | Status: DC

## 2022-07-28 ENCOUNTER — Encounter: Payer: Self-pay | Admitting: Internal Medicine

## 2022-07-29 ENCOUNTER — Other Ambulatory Visit (HOSPITAL_COMMUNITY): Payer: Self-pay

## 2022-07-29 ENCOUNTER — Telehealth: Payer: Self-pay

## 2022-07-29 ENCOUNTER — Ambulatory Visit
Admission: RE | Admit: 2022-07-29 | Discharge: 2022-07-29 | Disposition: A | Discharge status: Home / Self Care | Payer: 59 | Source: Ambulatory Visit | Attending: Nurse Practitioner | Admitting: Nurse Practitioner

## 2022-07-29 DIAGNOSIS — M5412 Radiculopathy, cervical region: Secondary | ICD-10-CM

## 2022-07-29 NOTE — Telephone Encounter (Signed)
Pharmacy Patient Advocate Encounter   Received notification that prior authorization for Testosterone 1.62% gel is required/requested.   PA submitted on 07/29/2022 to [employer] via CoverMyMeds [place]  Status is pending

## 2022-07-30 NOTE — Telephone Encounter (Signed)
Pharmacy Patient Advocate Encounter  Received notification from [employer] that the request for prior authorization for Testosterone 1.62% gel has been denied due to not meeting the prior authorization requirements.     This medicine is covered only if: All of the following: (A) You are not taking any of the following: (I) One of the following growth hormones, unless diagnosed with panhypopituitarism: Genotropin, Humatrope, Norditropin FlexPro, Nutropin AQ, Omnitrope, Saizen. (II) Aromatase inhibitor [for example, Arimidex (anastrozole), Femara (letrozole), Aromasin (exemestane)]. (B) You have one of the following: (I) Significant reduction in weight (less than 90% ideal body weight) (for example, acquired immunodeficiency syndrome wasting syndrome). (II) Osteopenia. (III) Osteoporosis. (IV) Decreased bone density. (V) Decreased libido. (VI) Organic cause of testosterone deficiency (for example, injury, tumor, infection, or genetic defects).  Key: Everette Rank

## 2022-08-01 ENCOUNTER — Other Ambulatory Visit: Payer: 59

## 2022-08-02 NOTE — Telephone Encounter (Signed)
Patient informed and expressed his understanding.

## 2022-09-12 ENCOUNTER — Other Ambulatory Visit: Payer: Self-pay | Admitting: Internal Medicine

## 2022-11-11 ENCOUNTER — Other Ambulatory Visit: Payer: Self-pay | Admitting: Internal Medicine

## 2022-11-25 ENCOUNTER — Encounter: Payer: Self-pay | Admitting: Internal Medicine

## 2022-11-25 ENCOUNTER — Ambulatory Visit: Payer: 59 | Admitting: Internal Medicine

## 2022-11-25 VITALS — BP 122/82 | HR 68 | Ht 76.0 in | Wt 288.0 lb

## 2022-11-25 DIAGNOSIS — R7989 Other specified abnormal findings of blood chemistry: Secondary | ICD-10-CM

## 2022-11-25 NOTE — Progress Notes (Signed)
Name: Dylan Hatfield  MRN/ DOB: [id number], Nov 10, 1965    Age/ Sex: 57 y.o., male    PCP: Colon Branch, MD   Reason for Endocrinology Evaluation: Hypogonadism     Date of Initial Endocrinology Evaluation: 07/23/2022     HPI: Dylan Hatfield is a 57 y.o. male with a past medical history of HTN and OSA. The patient presented for initial endocrinology clinic visit on 07/23/2022 for consultative assistance with his Hypogonadism.     Dylan Hatfield was referred here for further evaluation of low testosterone.  The patient was seen by [facility] earlier this year based on his wife's recommendations, and was started on testosterone pellets.  Per patient he believes his testosterone level was ~150-180   The patient has history of erythrocytosis with blood donations in the past, but his erythrocytosis worsened while on testosterone therapy requiring more hydration and more blood donation  He did not feel " bad" per se prior to testosterone therapy but he did notice improvement in his energy level and sleep pattern once he was on testosterone  No prior exposure to XRT No personal hx or FH of prostate cancer   He has OSA on CPAP  He is on hydrocodone for back issues but takes  he takes as needed    On his initial visit to our clinicHe had a normal CBC, As well as prolactin and LH.  Despite having a low testosterone level of 241 NG/dL.  His insurance declined coverage due to lack of symptoms  SUBJECTIVE:    Today (11/25/22):  Dylan Hatfield is here for follow-up on hypogonadism    He denies any erectile dysfunction or decreased libido Denies  nipple discharge  Nocturia 1-2 x  night  Continues to  sleep pretty well  He has the pellets ~6 - 7 weeks ago      HISTORY:  Past Medical History:  Past Medical History:  Diagnosis Date   Arthritis    Chronic back pain    bilateral L5/S1 transforaminal lumbar epidural steroid injections, Dr. [name]   DDD (degenerative disc disease),  lumbosacral    Hypertension    Lumbosacral spondylosis    Sleep apnea    on CPAP, dx ~ 2008    Past Surgical History:  Past Surgical History:  Procedure Laterality Date   COLONOSCOPY     POLYPECTOMY     VASECTOMY      Social History:  reports that he has quit smoking. He has been exposed to tobacco smoke. He has quit using smokeless tobacco. He reports that he does not drink alcohol and does not use drugs. Family History: family history includes Arrhythmia in his maternal aunt; Breast cancer in an other family member; Lung cancer in his father; Stroke in an other family member.   HOME MEDICATIONS: Allergies as of 11/25/2022   No Known Allergies      Medication List        Accurate as of November 25, 2022  7:13 AM. If you have any questions, ask your nurse or doctor.          acyclovir 400 MG tablet Commonly known as: ZOVIRAX Take 1 tablet (400 mg total) by mouth 2 (two) times daily. What changed: when to take this   amLODipine 5 MG tablet Commonly known as: NORVASC Take 1 tablet (5 mg total) by mouth daily.   cyclobenzaprine 10 MG tablet Commonly known as: FLEXERIL Take 10 mg by mouth at bedtime.  FISH OIL PO Take by mouth in the morning and at bedtime. ONE CAPSULE   gabapentin 100 MG capsule Commonly known as: NEURONTIN Take 100 mg by mouth 3 (three) times daily.   HYDROcodone-acetaminophen 10-325 MG tablet Commonly known as: NORCO Take 1 tablet by mouth as needed.   ibuprofen 200 MG tablet Commonly known as: ADVIL Take 200 mg by mouth every 6 (six) hours as needed.   MULTIVITAMIN ADULTS PO Take by mouth daily. TAKE ONE   OVER THE COUNTER MEDICATION daily. NATTOKNAISE TAKE ONE DAILY   OVER THE COUNTER MEDICATION in the morning and at bedtime. TAKE ONE GNC TRI-FLEX   tamsulosin 0.4 MG Caps capsule Commonly known as: FLOMAX Take 1 capsule (0.4 mg total) by mouth daily after supper.   Testosterone 20.25 MG/ACT (1.62%) Gel Place 1 Pump onto the  skin as directed. 1 pump to each shoulder daily          REVIEW OF SYSTEMS: A comprehensive ROS was conducted with the patient and is negative except as per HPI and below:  ROS     OBJECTIVE:  VS: There were no vitals taken for this visit.   Wt Readings from Last 3 Encounters:  07/23/22 267 lb (121.1 kg)  07/02/22 265 lb 2 oz (120.3 kg)  04/28/22 262 lb 12.8 oz (119.2 kg)     EXAM: General: Pt appears well and is in NAD  Eyes: External eye exam normal without stare, lid lag or exophthalmos.  EOM intact.  PERRL.  Neck: General: Supple without adenopathy. Thyroid: Thyroid size normal.  No goiter or nodules appreciated. No thyroid bruit.  Lungs: Clear with good BS bilat with no rales, rhonchi, or wheezes  Heart: Auscultation: RRR.  Abdomen: Normoactive bowel sounds, soft, nontender, without masses or organomegaly palpable  Genital: Normal external penile exam, testicular volume 20 mL bilaterally   Extremities:  BL LE: No pretibial edema normal ROM and strength.  Mental Status: Judgment, insight: Intact Orientation: Oriented to time, place, and person Mood and affect: No depression, anxiety, or agitation     DATA REVIEWED:  Latest Reference Range & Units 07/23/22 08:20  Sodium 135 - 145 mEq/L 139  Potassium 3.5 - 5.1 mEq/L 4.4  Chloride 96 - 112 mEq/L 102  CO2 19 - 32 mEq/L 29  Glucose 70 - 99 mg/dL 103 (H)  BUN 6 - 23 mg/dL 23  Creatinine 0.40 - 1.50 mg/dL 0.89  Calcium 8.4 - 10.5 mg/dL 9.9  Alkaline Phosphatase 39 - 117 U/L 45  Albumin 3.5 - 5.2 g/dL 4.7  AST 0 - 37 U/L 19  ALT 0 - 53 U/L 30  Total Protein 6.0 - 8.3 g/dL 6.4  Total Bilirubin 0.2 - 1.2 mg/dL 0.7  GFR >60.00 mL/min 95.40     Latest Reference Range & Units 07/23/22 08:20  WBC 4.0 - 10.5 K/uL 7.3  RBC 4.22 - 5.81 Mil/uL 5.19  Hemoglobin 13.0 - 17.0 g/dL 15.3  HCT 39.0 - 52.0 % 45.6  MCV 78.0 - 100.0 fl 87.8  MCHC 30.0 - 36.0 g/dL 33.6  RDW 11.5 - 15.5 % 14.1  Platelets 150.0 - 400.0 K/uL  203.0      Latest Reference Range & Units 07/23/22 08:20  LH 1.50 - 9.30 mIU/mL 8.51  Prolactin 2.0 - 18.0 ng/mL 3.6  Glucose 70 - 99 mg/dL 103 (H)  Testosterone, Total, LC-MS-MS 250 - 1,100 ng/dL 241 (L)  TSH 0.35 - 5.50 uIU/mL 2.43  T4,Free(Direct) 0.60 - 1.60 ng/dL 0.75  PSA 0.10 - 4.00 ng/mL 0.73    ASSESSMENT/PLAN/RECOMMENDATIONS:   Low testosterone: -Unfortunately, we have not been able to get his testosterone approved through his insurance company due to lack of symptoms -The patient is back on testosterone pellets to [facility] -Patient understands the risk and the benefit and would like to continue to follow-up with his provider that provides testosterone pellets  Signed electronically by: Mack Guise, MD  [facility]  [address] [place], [place] [address] Phone: [phone] FAX: [phone]   CC: Colon Branch, [address] [place] [address] Phone: [phone] Fax: [phone]   Return to Endocrinology clinic as below: Future Appointments  Date Time Provider [place]  11/25/2022  9:10 AM Jazlyn Tippens, Melanie Crazier, MD [facility] None  12/28/2022  8:40 AM Colon Branch, MD [facility]  05/24/2023  2:30 PM Ward Givens, NP [facility] None

## 2022-12-28 ENCOUNTER — Telehealth: Payer: Self-pay

## 2022-12-28 ENCOUNTER — Ambulatory Visit (INDEPENDENT_AMBULATORY_CARE_PROVIDER_SITE_OTHER): Payer: 59 | Admitting: Internal Medicine

## 2022-12-28 ENCOUNTER — Encounter: Payer: Self-pay | Admitting: Internal Medicine

## 2022-12-28 VITALS — BP 126/68 | HR 85 | Temp 98.0°F | Resp 16 | Ht 76.0 in | Wt 289.0 lb

## 2022-12-28 DIAGNOSIS — Z0000 Encounter for general adult medical examination without abnormal findings: Secondary | ICD-10-CM

## 2022-12-28 DIAGNOSIS — E291 Testicular hypofunction: Secondary | ICD-10-CM

## 2022-12-28 DIAGNOSIS — I10 Essential (primary) hypertension: Secondary | ICD-10-CM | POA: Diagnosis not present

## 2022-12-28 DIAGNOSIS — E785 Hyperlipidemia, unspecified: Secondary | ICD-10-CM

## 2022-12-28 LAB — CBC WITH DIFFERENTIAL/PLATELET
Basophils Absolute: 0 10*3/uL (ref 0.0–0.1)
Basophils Relative: 0.6 % (ref 0.0–3.0)
Eosinophils Absolute: 0.1 10*3/uL (ref 0.0–0.7)
Eosinophils Relative: 1.8 % (ref 0.0–5.0)
HCT: 53.3 % — ABNORMAL HIGH (ref 39.0–52.0)
Hemoglobin: 18.2 g/dL (ref 13.0–17.0)
Lymphocytes Relative: 21.5 % (ref 12.0–46.0)
Lymphs Abs: 1.6 10*3/uL (ref 0.7–4.0)
MCHC: 34 g/dL (ref 30.0–36.0)
MCV: 87.2 fl (ref 78.0–100.0)
Monocytes Absolute: 0.5 10*3/uL (ref 0.1–1.0)
Monocytes Relative: 6.2 % (ref 3.0–12.0)
Neutro Abs: 5.1 10*3/uL (ref 1.4–7.7)
Neutrophils Relative %: 69.9 % (ref 43.0–77.0)
Platelets: 207 10*3/uL (ref 150.0–400.0)
RBC: 6.11 Mil/uL — ABNORMAL HIGH (ref 4.22–5.81)
RDW: 14.6 % (ref 11.5–15.5)
WBC: 7.3 10*3/uL (ref 4.0–10.5)

## 2022-12-28 LAB — COMPREHENSIVE METABOLIC PANEL
ALT: 34 U/L (ref 0–53)
AST: 23 U/L (ref 0–37)
Albumin: 5 g/dL (ref 3.5–5.2)
Alkaline Phosphatase: 48 U/L (ref 39–117)
BUN: 22 mg/dL (ref 6–23)
CO2: 25 mEq/L (ref 19–32)
Calcium: 10.4 mg/dL (ref 8.4–10.5)
Chloride: 101 mEq/L (ref 96–112)
Creatinine, Ser: 1.06 mg/dL (ref 0.40–1.50)
GFR: 77.89 mL/min (ref >60.00)
Glucose, Bld: 100 mg/dL — ABNORMAL HIGH (ref 70–99)
Potassium: 4.4 mEq/L (ref 3.5–5.1)
Sodium: 136 mEq/L (ref 135–145)
Total Bilirubin: 1.1 mg/dL (ref 0.2–1.2)
Total Protein: 7 g/dL (ref 6.0–8.3)

## 2022-12-28 LAB — LIPID PANEL
Cholesterol: 201 mg/dL — ABNORMAL HIGH (ref 0–200)
HDL: 33.6 mg/dL — ABNORMAL LOW (ref >39.00)
NonHDL: 167.72
Total CHOL/HDL Ratio: 6
Triglycerides: 220 mg/dL — ABNORMAL HIGH (ref 0.0–149.0)
VLDL: 44 mg/dL — ABNORMAL HIGH (ref 0.0–40.0)

## 2022-12-28 LAB — LDL CHOLESTEROL, DIRECT: Direct LDL: 123 mg/dL

## 2022-12-28 LAB — PSA: PSA: 1.64 ng/mL (ref 0.10–4.00)

## 2022-12-28 NOTE — Progress Notes (Unsigned)
Subjective:    Patient ID: Dylan Hatfield, male    DOB: Jan 02, 1966, 57 y.o.   MRN: [id number]  DOS:  12/28/2022 Type of visit - description: CPX  Here for CPX. In general feels well.   Review of Systems See above   Past Medical History:  Diagnosis Date   Arthritis    Chronic back pain    bilateral L5/S1 transforaminal lumbar epidural steroid injections, Dr. [name]   DDD (degenerative disc disease), lumbosacral    Hypertension    Lumbosacral spondylosis    Sleep apnea    on CPAP, dx ~ 2008     Past Surgical History:  Procedure Laterality Date   COLONOSCOPY     POLYPECTOMY     VASECTOMY      Current Outpatient Medications  Medication Instructions   acyclovir (ZOVIRAX) 400 mg, Oral, 2 times daily   amLODipine (NORVASC) 5 mg, Oral, Daily   cyclobenzaprine (FLEXERIL) 10 mg, Oral, Daily at bedtime   gabapentin (NEURONTIN) 100 mg, Oral, 3 times daily   HYDROcodone-acetaminophen (NORCO) 10-325 MG per tablet 1 tablet, Oral, As needed   ibuprofen (ADVIL) 200 mg, Oral, Every 6 hours PRN   Multiple Vitamins-Minerals (MULTIVITAMIN ADULTS PO) Oral, Daily, TAKE ONE   Omega-3 Fatty Acids (FISH OIL PO) Oral, 2 times daily, ONE CAPSULE   OVER THE COUNTER MEDICATION Daily, NATTOKNAISE TAKE ONE DAILY   OVER THE COUNTER MEDICATION 2 times daily, TAKE ONE GNC TRI-FLEX   tamsulosin (FLOMAX) 0.4 mg, Oral, Daily after supper   Testosterone 20.25 MG/ACT (1.62%) GEL 1 Pump, Transdermal, As directed, 1 pump to each shoulder daily       Objective:   Physical Exam BP 126/68   Pulse 85   Temp 98 F (36.7 C) (Oral)   Resp 16   Ht 6\' 4"  (1.93 m)   Wt 289 lb (131.1 kg) Comment: w/ steel toes  SpO2 96%   BMI 35.18 kg/m  General: Well developed, NAD, BMI noted Neck: No  thyromegaly  HEENT:  Normocephalic . Face symmetric, atraumatic Lungs:  CTA B Normal respiratory effort, no intercostal retractions, no accessory muscle use. Heart: RRR,  no murmur.  Abdomen:  Not distended, soft,  non-tender. No rebound or rigidity.   Lower extremities: no pretibial edema bilaterally DRE: Normal sphincter tone, brown stools, prostate normal Skin: Exposed areas without rash. Not pale. Not jaundice Neurologic:  alert & oriented X3.  Speech normal, gait appropriate for age and unassisted Strength symmetric and appropriate for age.  Psych: Cognition and judgment appear intact.  Cooperative with normal attention span and concentration.  Behavior appropriate. No anxious or depressed appearing.     Assessment     Assessment  HTN OSA   Dx 2008, on CPAP MSK:DJD. Trigger fingers  Chronic back pain-- sees pain mngmt  LUTS -nocturia. Bony mass deformity of the chest wall, XR 06-2014 -- benign changes Lipoma: At the mid back, diameter 10 cm    Palpitations: saw cards (echo 2018) Rx testosterone elsewhere, [place] ~ 08-2021  PLAN   Here for CPX HTN: On amlodipine, seems well-controlled. OSA: Uses CPAP nightly. Testosterone: Currently using  pellets from [facility] clinic.  They do not check DRE's, DRE done today normal. Will check a CBC noting that the patient donates blood, 1 time so far this year.  Check PSA as well. RTC 1 year  -Td 2020    - shingrix x2 - COVID VAX booster d/w pt  - flu shot q fall  CCS: Colonoscopy 01/2017, + polyps, cscope 04-2022, next per GI  Prostate cancer screening:  On testosterone prescribed elsewhere, DRE negative, check a PSA. Labs: CMP FLP CBC PSA Diet, exercise: Plans to do better with diet, plans to see the wellness clinic.  === HTN: BP is great, continue amlodipine, check BMP OSA: Was evaluated via video 05/25/2022. HSV: RF Zovirax Lump right chest?  See physical exam, exam is benign, we agreed on observation. Polycythemia: Hemoglobin has been elevated since last year, he donates platelets 5 or 6 times a year and whole  blood 4-5 times a year. Polycythemia could be secondary to testosterone supplement so at my request HRT was stopped earlier  this year. Plan: Check CBC, iron, ferritin.  Has an appointment to see Endo for questionable hypogonadism Testosterone supplementation, hypogonadism?  Off HRT, see above, to see Endo soon [facility] Had a colonoscopy 04-2022. Flu shot today, advised to proceed with COVID-vaccine. RTC CPX 11-2022

## 2022-12-28 NOTE — Telephone Encounter (Signed)
Noted, will discuss with patient with the rest of the labs.

## 2022-12-28 NOTE — Telephone Encounter (Signed)
CRITICAL VALUE STICKER  CRITICAL VALUE: Hemoglobin 18.2  RECEIVER (on-site recipient of call): Manuela Schwartz, RMA  DATE & TIME NOTIFIED: 12/28/2022  MESSENGER (representative from lab): Delorise Jackson  MD NOTIFIED: Kathlene November, MD   TIME OF NOTIFICATION:1:10  RESPONSE:

## 2022-12-28 NOTE — Patient Instructions (Addendum)
Vaccines I recommend: Covid booster if not done after 05-2022 Flu shot every year  Check the  blood pressure regularly BP GOAL is between 110/65 and  135/85. If it is consistently higher or lower, let me know      GO TO THE LAB : Get the blood work     [place], [place] back for   a physical exam in 1 year    "Joiner of attorney" ,  "Living will" (Advance care planning documents)  If you already have a living will or healthcare power of attorney, is recommended you bring the copy to be scanned in your chart.   The document will be available to all the doctors you see in the system.  Advance care planning is a process that supports adults in  understanding and sharing their preferences regarding future medical care.  The patient's preferences are recorded in documents called Advance Directives and the can be modified at any time while the patient is in full mental capacity.   If you don't have one, please consider create one.      More information at: [email or web address]

## 2022-12-28 NOTE — Telephone Encounter (Signed)
PCP verbally made aware.  

## 2022-12-29 ENCOUNTER — Encounter: Payer: Self-pay | Admitting: Internal Medicine

## 2022-12-29 NOTE — Assessment & Plan Note (Signed)
Here for CPX HTN: On amlodipine, seems well-controlled. OSA: Uses CPAP nightly. Testosterone: Currently using  pellets from [facility] clinic.  They do not check DRE's, DRE done today normal. Will check a CBC noting that the patient donates blood, 1 time so far this year.  Check PSA as well. RTC 1 year

## 2022-12-29 NOTE — Assessment & Plan Note (Signed)
-  Td 2020    - shingrix x2 - COVID VAX booster d/w pt  - flu shot q fall  CCS: Colonoscopy 01/2017, + polyps, cscope 04-2022, next per GI  Prostate cancer screening:  On testosterone prescribed elsewhere, DRE negative, check a PSA. Labs: CMP FLP CBC PSA Diet, exercise: Plans to do better with diet, plans to see the wellness clinic.

## 2022-12-30 NOTE — Addendum Note (Signed)
Addended byDamita Dunnings D on: 12/30/2022 09:34 AM   Modules accepted: Orders

## 2023-01-03 ENCOUNTER — Encounter: Payer: Self-pay | Admitting: Internal Medicine

## 2023-01-03 DIAGNOSIS — M79671 Pain in right foot: Secondary | ICD-10-CM

## 2023-01-06 ENCOUNTER — Ambulatory Visit: Payer: 59 | Admitting: Podiatry

## 2023-01-06 ENCOUNTER — Ambulatory Visit (INDEPENDENT_AMBULATORY_CARE_PROVIDER_SITE_OTHER): Payer: 59

## 2023-01-06 DIAGNOSIS — R52 Pain, unspecified: Secondary | ICD-10-CM

## 2023-01-06 DIAGNOSIS — M722 Plantar fascial fibromatosis: Secondary | ICD-10-CM

## 2023-01-06 DIAGNOSIS — M216X2 Other acquired deformities of left foot: Secondary | ICD-10-CM

## 2023-01-06 DIAGNOSIS — M216X1 Other acquired deformities of right foot: Secondary | ICD-10-CM | POA: Diagnosis not present

## 2023-01-06 NOTE — Patient Instructions (Signed)

## 2023-01-06 NOTE — Progress Notes (Signed)
  Subjective:  Patient ID: Dylan Hatfield, male    DOB: 1966/04/29,  MRN: [id number]  Chief Complaint  Patient presents with   Foot Pain    Patient here fot pain in bilateral feel, pain located in the arches and the heells, patient has had orthotics made and has performed stretches and pain has become increasingly worse over time    57 y.o. male presents with the above complaint.  Patient presents with pain in both heels.  He says that he has pain a lot after he has been sitting or standing for a while and gets out of bed.  Pain noticed on the bilateral bottom of the heels.  He has had custom-made orthotics in the past and says they were helpful.  This was 10 to 15 years ago and he is hoping to get another pair.  He has tried an over-the-counter orthotic but it was not helpful.  He does take meloxicam.   Review of Systems: Negative except as noted in the HPI. Denies N/V/F/Ch.   Objective:  There were no vitals filed for this visit. There is no height or weight on file to calculate BMI. Constitutional Well developed. Well nourished.  Vascular Dorsalis pedis pulses palpable bilaterally. Posterior tibial pulses palpable bilaterally. Capillary refill normal to all digits.  No cyanosis or clubbing noted. Pedal hair growth normal.  Neurologic Normal speech. Oriented to person, place, and time. Epicritic sensation to light touch grossly present bilaterally.  Dermatologic Nails well groomed and normal in appearance. No open wounds. No skin lesions.  Orthopedic: Normal joint ROM without pain or crepitus bilaterally. No visible deformities. Tender to palpation at the calcaneal tuber bilaterally. No pain with calcaneal squeeze bilaterally. Ankle ROM diminished range of motion bilaterally. Silfverskiold Test: positive bilaterally.   Radiographs: Taken and reviewed. No acute fractures or dislocations. No evidence of stress fracture.  Plantar heel spur present. Posterior heel spur present.   Very large plantar heel spurs bilateral pes planus deformity noted  Assessment:   1. Plantar fasciitis, bilateral   2. Pronation deformity of both feet   3. Pain    Plan:  Patient was evaluated and treated and all questions answered.  Plantar Fasciitis, bilaterally - XR reviewed as above.  - Educated on icing and stretching. Instructions given.  - Injection delivered to the plantar fascia as below. - DME: Recommend custom made orthotics to support the plantar fascia offloaded and reduce inflammation as well as decrease overpronation.  Patient was scanned for these today - Pharmacologic management: Meloxicam which patient is already taking  Procedure: Injection Tendon/Ligament Location: Bilateral plantar fascia at the glabrous junction; medial approach. Skin Prep: alcohol Injectate: 1 cc 0.5% marcaine plain, 1 cc kenalog 10. Disposition: Patient tolerated procedure well. Injection site dressed with a band-aid.  No follow-ups on file.

## 2023-01-10 ENCOUNTER — Encounter: Payer: Self-pay | Admitting: *Deleted

## 2023-02-03 ENCOUNTER — Ambulatory Visit: Payer: 59 | Admitting: Podiatry

## 2023-02-03 DIAGNOSIS — M722 Plantar fascial fibromatosis: Secondary | ICD-10-CM

## 2023-02-03 NOTE — Progress Notes (Signed)
Pt seen by [facility] for orthotics dispense, states he is doing well in regards to heel pain Charges entered for Orthotics

## 2023-03-11 ENCOUNTER — Other Ambulatory Visit: Payer: Self-pay | Admitting: Internal Medicine

## 2023-03-21 ENCOUNTER — Ambulatory Visit (INDEPENDENT_AMBULATORY_CARE_PROVIDER_SITE_OTHER): Admitting: Family Medicine

## 2023-03-21 ENCOUNTER — Encounter (INDEPENDENT_AMBULATORY_CARE_PROVIDER_SITE_OTHER): Payer: Self-pay | Admitting: Family Medicine

## 2023-03-21 VITALS — BP 126/68 | HR 88 | Temp 98.2°F | Resp 14 | Ht 71.0 in | Wt 225.0 lb

## 2023-03-21 MED ORDER — HYDROXYZINE HCL 25 MG OR TABS: 25.00 mg | ORAL_TABLET | Freq: Two times a day (BID) | ORAL | Status: AC

## 2023-03-21 NOTE — Assessment & Plan Note (Signed)
Active   Labs per orders  Will notify with results and further recommendations

## 2023-03-21 NOTE — Assessment & Plan Note (Addendum)
Stable  Hx of ACL and meniscal tear and surgeries  Improved since toradol and solu-medrol inj on 03/02/23  Consider kenalog injection   Consider ortho referral   Follow up if swelling/pain returns

## 2023-03-21 NOTE — Progress Notes (Signed)
Encounter Date:  03/21/2023  4:17 PM   PCP: Kara Pacer  MRN: [id number]  DOB: 1966-06-24     HPI:  Henry Norman, 57 year old male, presents   Chief Complaint   Patient presents with    Physical     Colonoscopy DUE   Req blood work  Per pt left knee pain swelling x 3 years     Preventative:  Last colonoscopy: never done  Exercise: swims and uses exercise bike    Immunizations:  COVID: x2 - UTD  Tdap: 2011 - due   Flu shot: due    Social history:  Tobacco use: former; quit in 2006  Alcohol use: no    Other concerns:     Unsure of his fhx as he was adopted as a Development worker, international aid.     Hx of left ACL and meniscal tear. Did not complete post-op rehabilitation. Experiencing left knee pain. Was recommended to get additional surgery when older. His left knee swells significantly when walking or going up stairs. No previously cortisone injections. Last XR knee was 10 years ago. Left knee does not pop out or give out on him. Received toradol 30 mg Daishia Fetterly inj, and solu-medrol 62.5 mg/mL inj in the ER on 03/02/23 for back pain which is helping with his left knee swelling.         03/21/2023     3:59 PM 11/05/2020     4:56 PM 08/29/2017    12:35 PM 07/04/2017     4:57 PM   Date Weight Recorded   Metric 102.059 kg 98.249 kg 96.616 kg 95.074 kg   Pounds/Ounces 225 lb 216 lb 9.6 oz 213 lb 209 lb 9.6 oz        CURRENT  MEDICATIONS:  Current Outpatient Medications on File Prior to Visit   Medication Sig Dispense Refill    [DISCONTINUED] clonazePAM (KLONOPIN) 0.5 MG tablet Take 0.5 mg by mouth nightly. Take 0.25mg  every night  0    [DISCONTINUED] clotrimazole-betamethasone (LOTRISONE) 1-0.05 % cream Apply 1 Application topically 2 times daily. Use a small amount as directed 1 each 0    hydrOXYzine HCL (ATARAX) 25 MG tablet Take 1 tablet (25 mg) by mouth 2 times daily.      mirtazapine (REMERON) 15 MG tablet Take 1 tablet (15 mg) by mouth nightly.  0     No current facility-administered medications on file prior to visit.     Outpatient Medications Marked as  Taking for the 03/21/23 encounter (Office Visit) with Kara Pacer, MD   Medication Sig Dispense Refill    mirtazapine (REMERON) 15 MG tablet Take 1 tablet (15 mg) by mouth nightly.  0        ALLERGIES:    Allergies   Allergen Reactions    Fluoxetine Palpitations    Paroxetine Palpitations          REVIEW OF SYSTEMS:  Review of Systems   Constitutional:  Negative for activity change, appetite change, chills, fatigue, fever and unexpected weight change.   HENT:  Negative for dental problem and hearing loss.    Eyes:  Negative for visual disturbance.   Respiratory:  Negative for cough, chest tightness and shortness of breath.    Cardiovascular:  Negative for chest pain, palpitations and leg swelling.   Gastrointestinal:  Negative for abdominal pain, constipation, diarrhea, nausea and vomiting.   Genitourinary:  Negative for difficulty urinating and frequency.   Skin:  Negative for rash.   Neurological:  Negative for  dizziness, light-headedness and headaches.   Psychiatric/Behavioral:  Negative for sleep disturbance.          PHYSICAL EXAM:   03/21/23  1559   BP: 126/68   Pulse: 88   Temp: 98.2 F (36.8 C)   Resp: 14   SpO2: 96%     Body mass index is 31.38 kg/m.    Ht Readings from Last 1 Encounters:   03/21/23 5\' 11"  (1.803 m)     Wt Readings from Last 1 Encounters:   03/21/23 102.1 kg (225 lb)         Physical Exam  Vitals and nursing note reviewed. Exam conducted with a chaperone present.   Constitutional:       Appearance: He is well-developed.   HENT:      Head: Normocephalic.      Right Ear: External ear normal.      Left Ear: External ear normal.      Nose: Nose normal.   Eyes:      Pupils: Pupils are equal, round, and reactive to light.   Cardiovascular:      Rate and Rhythm: Normal rate and regular rhythm.      Heart sounds: Normal heart sounds.   Pulmonary:      Effort: Pulmonary effort is normal.      Breath sounds: Normal breath sounds.   Musculoskeletal:      Cervical back: Normal range of motion and  neck supple.   Skin:     General: Skin is warm.   Neurological:      Mental Status: He is alert.   Psychiatric:         Behavior: Behavior normal.           ASSESSMENT & PLAN:    Charlton was seen today for physical.    Diagnoses and all orders for this visit:    Encounter for routine adult health examination without abnormal findings  Assessment & Plan:  Stable  Labs ordered   Vaccine Recommendations discussed and reviewed   Diet and exercise discussed in detail with patient   Routine dental exam advised   Quality Management Section reviewed and updated.   Family, Surgical History and Social History reviewed.   Screening exams discussed and advised if indicated.  F/u annually    Orders:  -     CBC w/ Diff Lavender  -     Comprehensive Metabolic Panel  -     Lipid Panel Green Plasma Separator Tube    Chronic pain of left knee  Assessment & Plan:  Stable  Hx of ACL and meniscal tear and surgeries  Improved since toradol and solu-medrol inj on 03/02/23  Consider kenalog injection   Consider ortho referral   Follow up if swelling/pain returns      Screening for endocrine, nutritional, metabolic and immunity disorder  Assessment & Plan:  Active   Labs per orders  Will notify with results and further recommendations    Orders:  -     CBC w/ Diff Lavender  -     Comprehensive Metabolic Panel  -     Lipid Panel Green Plasma Separator Tube    Screening for colon cancer  Assessment & Plan:  Stable  Referred to GI for colonoscopy   Monitor   Will notify patient of results    Orders:  -     Consult/Referral to GI for Colonoscopy Screening    Need for vaccination  Assessment & Plan:  Stable  Tdap vaccine administered  See MAR summary  Discussed possible side effects of vaccine administration   F/u prn    Orders:  -     Tdap (BOOSTRIX or Adacel) for patients 34-18 years old          ICD-10-CM ICD-9-CM    1. Encounter for routine adult health examination without abnormal findings  Z00.00 V70.0 CBC w/ Diff Lavender      Comprehensive  Metabolic Panel      Lipid Panel Green Plasma Separator Tube      2. Chronic pain of left knee  M25.562 719.46     G89.29 338.29       3. Screening for endocrine, nutritional, metabolic and immunity disorder  Z13.29 V77.99 CBC w/ Diff Lavender    Z13.21  Comprehensive Metabolic Panel    Z13.228  Lipid Panel Green Plasma Separator Tube    Z13.0        4. Screening for colon cancer  Z12.11 V76.51 Consult/Referral to GI for Colonoscopy Screening      5. Need for vaccination  Z23 V05.9 Tdap (BOOSTRIX or Adacel) for patients 14-23 years old            By signing below, I acknowledge that I have reviewed the above note,  dictated by me and scribed by Dellie Catholic for accuracy and edited  where necessary. The note accurately reflects the services provided at this  encounter. We retain the right to modify this information in the event of  errors. Occasional errors in punctuation, grammar and content may occur.        Electronically reviewed and signed by Kara Pacer, M.D.      [facility]  [email or web address]

## 2023-03-21 NOTE — Assessment & Plan Note (Signed)
Stable  Referred to GI for colonoscopy   Monitor   Will notify patient of results

## 2023-03-21 NOTE — Assessment & Plan Note (Signed)
Stable  Tdap vaccine administered  See MAR summary  Discussed possible side effects of vaccine administration   F/u prn

## 2023-03-21 NOTE — Assessment & Plan Note (Signed)
Stable  Labs ordered   Vaccine Recommendations discussed and reviewed   Diet and exercise discussed in detail with patient   Routine dental exam advised   Quality Management Section reviewed and updated.   Family, Surgical History and Social History reviewed.   Screening exams discussed and advised if indicated.  F/u annually

## 2023-04-30 LAB — CBC WITH DIFF, BLOOD
Abs Basophils: 39 cells/uL (ref 0–200)
Abs Eosinophils: 78 cells/uL (ref 15–500)
Abs Lymphs: 1596 cells/uL (ref 850–3900)
Abs Monocytes: 336 cells/uL (ref 200–950)
Abs Neutrophils: 3550 cells/uL (ref 1500–7800)
Basophils: 0.7 %
Eosinophils: 1.4 %
HCT: 50.8 % — ABNORMAL HIGH (ref 38.5–50.0)
HGB: 17.5 g/dL — ABNORMAL HIGH (ref 13.2–17.1)
Lymps: 28.5 %
MCH: 30.3 pg (ref 27.0–33.0)
MCHC: 34.4 g/dL (ref 32.0–36.0)
MCV: 88 fL (ref 80.0–100.0)
MPV: 9.2 fL (ref 7.5–12.5)
Monocytes: 6 %
PLT: 221 10*3/uL (ref 140–400)
RBC: 5.77 10*6/uL (ref 4.20–5.80)
RDW: 12.8 % (ref 11.0–15.0)
SEGS: 63.4 %
WBC: 5.6 10*3/uL (ref 3.8–10.8)

## 2023-04-30 LAB — LDL CHOLESTEROL, DIRECT: LDL-Cholesterol: 121 mg/dL (calc) — ABNORMAL HIGH

## 2023-04-30 LAB — COMPREHENSIVE METABOLIC PANEL, BLOOD
ALT (SGPT): 22 U/L (ref 9–46)
AST (SGOT): 20 U/L (ref 10–35)
Albumin/Glob Ratio: 2 (calc) (ref 1.0–2.5)
Albumin: 4.5 g/dL (ref 3.6–5.1)
Alkaline Phos: 55 U/L (ref 35–144)
BUN: 12 mg/dL (ref 7–25)
Bilirubin, Total: 0.7 mg/dL (ref 0.2–1.2)
Calcium: 9.5 mg/dL (ref 8.6–10.3)
Carbon Dioxide: 27 mmol/L (ref 20–32)
Chloride: 102 mmol/L (ref 98–110)
Creatinine: 0.92 mg/dL (ref 0.70–1.30)
EGFR: 97 mL/min/{1.73_m2} (ref >=60)
Globulin: 2.3 g/dL (calc) (ref 1.9–3.7)
Glucose: 84 mg/dL (ref 65–99)
Potassium: 4.5 mmol/L (ref 3.5–5.3)
Sodium: 137 mmol/L (ref 135–146)
Total Protein: 6.8 g/dL (ref 6.1–8.1)

## 2023-04-30 LAB — CHOLESTEROL, TOTAL BLOOD: Cholesterol: 194 mg/dL (ref <200)

## 2023-04-30 LAB — CHOLESTEROL/HDLC RATIO-QUEST: Chol/HDLC Ratio: 3.7 (calc) (ref <5.0)

## 2023-04-30 LAB — HDL-CHOLESTEROL, BLOOD: HDL Cholesterol: 53 mg/dL (ref >=40)

## 2023-04-30 LAB — NON HDL CHOLESTEROL -QUEST: Non-HDL Cholesterol: 141 mg/dL (calc) — ABNORMAL HIGH (ref <130)

## 2023-04-30 LAB — TRIGLYCERIDES, BLOOD: Triglycerides: 101 mg/dL (ref <150)

## 2023-05-10 ENCOUNTER — Other Ambulatory Visit: Payer: Self-pay | Admitting: Internal Medicine

## 2023-05-23 ENCOUNTER — Encounter: Payer: Self-pay | Admitting: *Deleted

## 2023-05-24 ENCOUNTER — Telehealth (INDEPENDENT_AMBULATORY_CARE_PROVIDER_SITE_OTHER): Payer: 59 | Admitting: Adult Health

## 2023-05-24 DIAGNOSIS — G4733 Obstructive sleep apnea (adult) (pediatric): Secondary | ICD-10-CM | POA: Diagnosis not present

## 2023-05-25 ENCOUNTER — Telehealth: Payer: Self-pay | Admitting: *Deleted

## 2023-05-25 NOTE — Telephone Encounter (Signed)
PAP order sent to [facility] regarding humidity control.

## 2023-05-26 NOTE — Telephone Encounter (Signed)
[facility] confirmed receipt of order.  

## 2023-08-15 ENCOUNTER — Encounter: Payer: Self-pay | Admitting: Internal Medicine

## 2023-08-15 ENCOUNTER — Other Ambulatory Visit: Payer: Self-pay | Admitting: Internal Medicine

## 2023-08-15 MED ORDER — ACYCLOVIR 400 MG PO TABS: 400.0000 mg | ORAL_TABLET | Freq: Two times a day (BID) | ORAL | 1 refills | Status: DC

## 2023-08-24 ENCOUNTER — Other Ambulatory Visit: Payer: Self-pay | Admitting: Internal Medicine

## 2023-11-22 ENCOUNTER — Other Ambulatory Visit: Payer: Self-pay | Admitting: Internal Medicine

## 2023-12-07 ENCOUNTER — Encounter (INDEPENDENT_AMBULATORY_CARE_PROVIDER_SITE_OTHER): Payer: Self-pay

## 2023-12-07 ENCOUNTER — Encounter: Payer: Self-pay | Admitting: Internal Medicine

## 2023-12-07 DIAGNOSIS — I10 Essential (primary) hypertension: Secondary | ICD-10-CM

## 2023-12-07 DIAGNOSIS — E669 Obesity, unspecified: Secondary | ICD-10-CM

## 2023-12-12 ENCOUNTER — Encounter (INDEPENDENT_AMBULATORY_CARE_PROVIDER_SITE_OTHER): Payer: Self-pay | Admitting: Family Medicine

## 2023-12-19 ENCOUNTER — Encounter: Payer: Self-pay | Admitting: Nurse Practitioner

## 2023-12-19 ENCOUNTER — Ambulatory Visit: Admitting: Nurse Practitioner

## 2023-12-19 VITALS — BP 130/87 | HR 73 | Temp 97.5°F | Ht 76.0 in | Wt 282.0 lb

## 2023-12-19 DIAGNOSIS — E66811 Obesity, class 1: Secondary | ICD-10-CM | POA: Diagnosis not present

## 2023-12-19 DIAGNOSIS — G4733 Obstructive sleep apnea (adult) (pediatric): Secondary | ICD-10-CM | POA: Diagnosis not present

## 2023-12-19 DIAGNOSIS — E785 Hyperlipidemia, unspecified: Secondary | ICD-10-CM

## 2023-12-19 DIAGNOSIS — Z6834 Body mass index (BMI) 34.0-34.9, adult: Secondary | ICD-10-CM

## 2023-12-19 DIAGNOSIS — I10 Essential (primary) hypertension: Secondary | ICD-10-CM

## 2023-12-19 DIAGNOSIS — E6609 Other obesity due to excess calories: Secondary | ICD-10-CM

## 2023-12-19 DIAGNOSIS — Z0289 Encounter for other administrative examinations: Secondary | ICD-10-CM

## 2023-12-19 NOTE — Progress Notes (Signed)
 Office: [phone]  /  Fax: [phone]   Initial Visit  Dylan Hatfield was seen in clinic today to evaluate for obesity. He is interested in losing weight to improve overall health and reduce the risk of weight related complications. He presents today to review program treatment options, initial physical assessment, and evaluation.     He was referred by: PCP  When asked what else they would like to accomplish? He states: Adopt healthier eating patterns, Improve existing medical conditions, Reduce number of medications, Improve quality of life, and Improve appearance  Weight history:  When asked how has your weight affected you? He states: Contributed to medical problems, Contributed to orthopedic problems or mobility issues, Having fatigue, and Having poor endurance  Some associated conditions: Hypertension, OSAS on CPAP, DJD, HLD, low testosterone, palpitations  Contributing factors: Family history of obesity, Moderate to high levels of stress, Reduced physical activity, and Eating patterns  Weight promoting medications identified: None  Current nutrition plan: Portion control / smart choices  Current level of physical activity: [place]   Current or previous pharmacotherapy: None  Response to medication: Never tried medications   Past medical history includes:   Past Medical History:  Diagnosis Date   Arthritis    Chronic back pain    bilateral L5/S1 transforaminal lumbar epidural steroid injections, Dr. Pernell Hatfield   DDD (degenerative disc disease), lumbosacral    Hypertension    Lumbosacral spondylosis    Sleep apnea    on CPAP, dx ~ 2008      Objective:   BP 130/87   Pulse 73   Temp (!) 97.5 F (36.4 C)   Ht 6\' 4"  (1.93 m)   Wt 282 lb (127.9 kg)   SpO2 97%   BMI 34.33 kg/m  He was weighed on the bioimpedance scale: Body mass index is 34.33 kg/m.  Peak Weight:292 lbs , Body Fat%:29.3%, Visceral Fat Rating:17, Weight trend over the last 12 months:  Increasing  General:  Alert, oriented and cooperative. Patient is in no acute distress.  Respiratory: Normal respiratory effort, no problems with respiration noted   Gait: able to ambulate independently  Mental Status: Normal mood and affect. Normal behavior. Normal judgment and thought content.   DIAGNOSTIC DATA REVIEWED:  BMET    Component Value Date/Time   NA 136 12/28/2022 0905   K 4.4 12/28/2022 0905   CL 101 12/28/2022 0905   CO2 25 12/28/2022 0905   GLUCOSE 100 (H) 12/28/2022 0905   BUN 22 12/28/2022 0905   CREATININE 1.06 12/28/2022 0905   CALCIUM 10.4 12/28/2022 0905   GFRNONAA 86.09 12/10/2009 0941   GFRAA 94 12/05/2008 0753   Lab Results  Component Value Date   HGBA1C 5.0 12/18/2019   HGBA1C 5.7 11/25/2016   No results found for: "INSULIN" CBC    Component Value Date/Time   WBC 7.3 12/28/2022 0905   RBC 6.11 (H) 12/28/2022 0905   HGB 18.2 Repeated and verified X2. (HH) 12/28/2022 0905   HCT 53.3 (H) 12/28/2022 0905   PLT 207.0 12/28/2022 0905   MCV 87.2 12/28/2022 0905   MCHC 34.0 12/28/2022 0905   RDW 14.6 12/28/2022 0905   Iron/TIBC/Ferritin/ %Sat    Component Value Date/Time   IRON 111 07/02/2022 0815   FERRITIN 105.2 07/02/2022 0815   Lipid Panel     Component Value Date/Time   CHOL 201 (H) 12/28/2022 0905   TRIG 220.0 (H) 12/28/2022 0905   HDL 33.60 (L) 12/28/2022 0905   CHOLHDL 6 12/28/2022  0905   VLDL 44.0 (H) 12/28/2022 0905   LDLCALC 118 (H) 03/05/2021 0720   LDLDIRECT 123.0 12/28/2022 0905   Hepatic Function Panel     Component Value Date/Time   PROT 7.0 12/28/2022 0905   ALBUMIN 5.0 12/28/2022 0905   AST 23 12/28/2022 0905   ALT 34 12/28/2022 0905   ALKPHOS 48 12/28/2022 0905   BILITOT 1.1 12/28/2022 0905   BILIDIR 0.1 12/21/2021 0829   IBILI 0.7 05/27/2020 0937      Component Value Date/Time   TSH 2.43 07/23/2022 0820     Assessment and Plan:   Primary hypertension Continue to follow-up with PCP.  Keep appointment  with cardiology tomorrow.  Continue medications as directed.  Hyperlipidemia, unspecified hyperlipidemia type Continue to follow-up with PCP.  OSA on CPAP Continue CPAP nightly  Class 1 obesity due to excess calories with serious comorbidity and body mass index (BMI) of 34.0 to 34.9 in adult        Obesity Treatment / Action Plan:  Patient will work on garnering support from family and friends to begin weight loss journey. Will work on eliminating or reducing the presence of highly palatable, calorie dense foods in the home. Will complete provided nutritional and psychosocial assessment questionnaire before the next appointment. Will be scheduled for indirect calorimetry to determine resting energy expenditure in a fasting state.  This will allow [place] to create a reduced calorie, high-protein meal plan to promote loss of fat mass while preserving muscle mass. Counseled on the health benefits of losing 5%-15% of total body weight. Was counseled on nutritional approaches to weight loss and benefits of reducing processed foods and consuming plant-based foods and high quality protein as part of nutritional weight management. Was counseled on pharmacotherapy and role as an adjunct in weight management.   Obesity Education Performed Today:  He was weighed on the bioimpedance scale and results were discussed and documented in the synopsis.  We discussed obesity as a disease and the importance of a more detailed evaluation of all the factors contributing to the disease.  We discussed the importance of long term lifestyle changes which include nutrition, exercise and behavioral modifications as well as the importance of customizing this to his specific health and social needs.  We discussed the benefits of reaching a healthier weight to alleviate the symptoms of existing conditions and reduce the risks of the biomechanical, metabolic and psychological effects of obesity.  Dylan Hatfield  appears to be in the action stage of change and states they are ready to start intensive lifestyle modifications and behavioral modifications.  30 minutes was spent today on this visit including the above counseling, pre-visit chart review, and post-visit documentation.  Reviewed by clinician on day of visit: allergies, medications, problem list, medical history, surgical history, family history, social history, and previous encounter notes pertinent to obesity diagnosis.    Theodis Sato Jayliani Wanner FNP-C

## 2023-12-20 ENCOUNTER — Other Ambulatory Visit: Payer: Self-pay | Admitting: Internal Medicine

## 2023-12-21 ENCOUNTER — Ambulatory Visit: Payer: 59 | Attending: Cardiology | Admitting: Cardiology

## 2023-12-21 NOTE — Progress Notes (Deleted)
  Electrophysiology Office Note:   Date:  12/21/2023  ID:  Dylan Hatfield, DOB 1965/11/02, MRN [id number]  Primary Cardiologist: None Primary Heart Failure: None Electrophysiologist: Moira Umholtz Jorja Loa, MD  {Click to update primary MD,subspecialty MD or APP then REFRESH:1}    History of Present Illness:   Dylan Hatfield is a 58 y.o. male with h/o palpitations, obstructive sleep apnea seen today for {VISITTYPE:28148}  He was initially followed in 2020 for palpitations.  He had a cardia mobile app that showed recordings of sinus rhythm at the time.  Review of systems complete and found to be negative unless listed in HPI.   EP Information / Studies Reviewed:    {EKGtoday:28818}      ***  Risk Assessment/Calculations:   {Does this patient have ATRIAL FIBRILLATION?:[phone]} No BP recorded.  {Refresh Note OR Click here to enter BP  :1}***        Physical Exam:   VS:  There were no vitals taken for this visit.   Wt Readings from Last 3 Encounters:  12/19/23 282 lb (127.9 kg)  12/28/22 289 lb (131.1 kg)  11/25/22 288 lb (130.6 kg)     GEN: Well nourished, well developed in no acute distress NECK: No JVD; No carotid bruits CARDIAC: {EPRHYTHM:28826}, no murmurs, rubs, gallops RESPIRATORY:  Clear to auscultation without rales, wheezing or rhonchi  ABDOMEN: Soft, non-tender, non-distended EXTREMITIES:  No edema; No deformity   ASSESSMENT AND PLAN:   ***  Follow up with {QMVHQ:46962} {EPFOLLOW XB:28413}  Signed, Mercadez Heitman Jorja Loa, MD

## 2023-12-22 ENCOUNTER — Encounter: Payer: Self-pay | Admitting: Cardiology

## 2023-12-27 ENCOUNTER — Ambulatory Visit: Admitting: Bariatrics

## 2023-12-27 ENCOUNTER — Encounter: Payer: Self-pay | Admitting: Bariatrics

## 2023-12-27 VITALS — BP 137/72 | HR 62 | Temp 97.5°F | Ht 76.0 in | Wt 282.0 lb

## 2023-12-27 DIAGNOSIS — E6609 Other obesity due to excess calories: Secondary | ICD-10-CM

## 2023-12-27 DIAGNOSIS — E559 Vitamin D deficiency, unspecified: Secondary | ICD-10-CM

## 2023-12-27 DIAGNOSIS — R7309 Other abnormal glucose: Secondary | ICD-10-CM

## 2023-12-27 DIAGNOSIS — R0602 Shortness of breath: Secondary | ICD-10-CM

## 2023-12-27 DIAGNOSIS — I10 Essential (primary) hypertension: Secondary | ICD-10-CM | POA: Diagnosis not present

## 2023-12-27 DIAGNOSIS — E785 Hyperlipidemia, unspecified: Secondary | ICD-10-CM | POA: Diagnosis not present

## 2023-12-27 DIAGNOSIS — R5383 Other fatigue: Secondary | ICD-10-CM | POA: Diagnosis not present

## 2023-12-27 DIAGNOSIS — Z0000 Encounter for general adult medical examination without abnormal findings: Secondary | ICD-10-CM

## 2023-12-27 DIAGNOSIS — Z1331 Encounter for screening for depression: Secondary | ICD-10-CM

## 2023-12-27 DIAGNOSIS — G4733 Obstructive sleep apnea (adult) (pediatric): Secondary | ICD-10-CM | POA: Diagnosis not present

## 2023-12-27 DIAGNOSIS — D751 Secondary polycythemia: Secondary | ICD-10-CM

## 2023-12-27 DIAGNOSIS — E66811 Obesity, class 1: Secondary | ICD-10-CM

## 2023-12-27 DIAGNOSIS — Z6834 Body mass index (BMI) 34.0-34.9, adult: Secondary | ICD-10-CM

## 2023-12-27 NOTE — Progress Notes (Signed)
 At a Glance:  Vitals Temp: (!) 97.5 F (36.4 C) BP: 137/72 Pulse Rate: 62 SpO2: 97 %   Anthropometric Measurements Height: 6\' 4"  (1.93 m) Weight: 282 lb (127.9 kg) BMI (Calculated): 34.34 Starting Weight: 282lb Peak Weight: 292lb   Body Composition  Body Fat %: 29.4 % Fat Mass (lbs): 83 lbs Muscle Mass (lbs): 189.6 lbs Total Body Water (lbs): 137 lbs Visceral Fat Rating : 17   Other Clinical Data RMR: 2664 Fasting: yes Labs: yes Today's Visit #: 1 Starting Date: 12/27/23     Indirect Calorimeter:   Resting Metabolic Rate ( RMR):  RMR (actual): 2664 kcal RMR (calculated): 2738 kcal The calculated basal metabolic rate is 9147 kcal thus his basal metabolic rate is worse than expected.  Plan:   Indirect calorimeter completed, interpreted and reviewed with patient today and allowed to ask questions.  Discussed the implications for the chosen plan and exercise based on the RMR reading.  Will consider repeating the RMR in the future based on weight loss.    Chief Complaint:  Obesity   Subjective:  Dylan Hatfield (MR# [id number]) is a 58 y.o. male who presents for evaluation and treatment of obesity and related comorbidities.   [name] is currently in the action stage of change and ready to dedicate time achieving and maintaining a healthier weight. German is interested in becoming our patient and working on intensive lifestyle modifications including (but not limited to) diet and exercise for weight loss.  [name] has been struggling with his weight. He has been unsuccessful in either losing weight, maintaining weight loss, or reaching his healthy weight goal.  [name]'s habits were reviewed today and are as follows: His family eats meals together, he thinks his family will eat healthier with him, and he started gaining weight in his 40's. [name]   He started gaining weight in the 40's. .   Current or previous pharmacotherapy: None  Response to medication: Never  tried medications  Other Fatigue [name] denies daytime somnolence and denies waking up still tired. Patient has a history of symptoms of hypertension. [name] generally gets 8 hours of sleep per night, and states that he has generally restful sleep. Snoring is present. Apneic episodes is present. Epworth Sleepiness Score is 3.   Shortness of Breath [name] notes increasing shortness of breath with exercising and seems to be worsening over time with weight gain. He notes getting out of breath sooner with activity than he used to. This has gotten worse recently. [name] denies shortness of breath at rest or orthopnea.  Depression Screen [name]'s Food and Mood (modified PHQ-9) score was 13. 10-14 moderate depression     12/28/2022    8:31 AM  Depression screen PHQ 2/9  Decreased Interest 0  Down, Depressed, Hopeless 0  PHQ - 2 Score 0     Assessment and Plan:   Other Fatigue [name] does feel that his weight is causing his energy to be lower than it should be. Fatigue may be related to obesity, depression or many other causes. Labs will be ordered, and in the meanwhile, [name] will focus on self care including making healthy food choices, increasing physical activity and focusing on stress reduction.  Shortness of Breath [name] does feel that he gets out of breath more easily that he used to when he exercises. [name]'s shortness of breath appears to be obesity related and exercise induced. He has agreed to work on weight loss and gradually increase exercise to treat his exercise induced  shortness of breath. Will continue to monitor closely.  Health Maintenance:   Obesity   Plan: Will do indirect calorimetry, and labs.    Obstructive Sleep Apnea [name] has a diagnosis of sleep apnea. He reports that he is using a CPAP regularly. Reports restful sleep.   Plan: Continue CPAP therapy. Continue to practice good sleep hygiene.  Will add in elements of an antiinflammatory diet and  add in  elements of a Mediterranean diet.  Reduce intake of carbohydrates for weight loss.   Hyperlipidemia LDL is not at goal. Medication(s): Omega-3 fatty acids Cardiovascular risk factors: advanced age (older than 45 for men, 33 for women), dyslipidemia, hypertension, male gender, obesity (BMI >= 30 kg/m2), and sedentary lifestyle  Lab Results  Component Value Date   CHOL 201 (H) 12/28/2022   HDL 33.60 (L) 12/28/2022   LDLCALC 118 (H) 03/05/2021   LDLDIRECT 123.0 12/28/2022   TRIG 220.0 (H) 12/28/2022   CHOLHDL 6 12/28/2022   Lab Results  Component Value Date   ALT 34 12/28/2022   AST 23 12/28/2022   ALKPHOS 48 12/28/2022   BILITOT 1.1 12/28/2022   The 10-year ASCVD risk score (Arnett DK, et al., 2019) is: 13%   Values used to calculate the score:     Age: 55 years     Sex: Male     Is Non-Hispanic African American: No     Diabetic: No     Tobacco smoker: No     Systolic Blood Pressure: 137 mmHg     Is BP treated: Yes     HDL Cholesterol: 33.6 mg/dL     Total Cholesterol: 201 mg/dL  Plan:   Will avoid all trans fats.  Will read labels Will minimize saturated fats except the following: low fat meats in moderation, diary, and limited dark chocolate.  Continue omega-3 fatty acids.  Will do a lipid panel.  Vitamin D Deficiency His vitamin D level has been low in the past.  He is at risk for vitamin D deficiency due to obesity.  He is not on vitamin D   Plan: Will check for vitamin D deficiency.   [name] had a positive depression screening. Depression is commonly associated with obesity and often results in emotional eating behaviors. We will monitor this closely and work on CBT to help improve the non-hunger eating patterns. Referral to Psychology may be required if no improvement is seen as he continues in our clinic.    Hypertension Hypertension stable.  Medication(s): Amlodipine 5 mg 1 daily   BP Readings from Last 3 Encounters:  12/27/23 137/72  12/19/23  130/87  12/28/22 126/68   Lab Results  Component Value Date   CREATININE 1.06 12/28/2022   CREATININE 0.89 07/23/2022   CREATININE 0.96 07/02/2022   Lab Results  Component Value Date   GFR 77.89 12/28/2022   GFR 95.40 07/23/2022   GFR 88.03 07/02/2022    Plan: Continue all antihypertensives at current dosages. No added salt. Will keep sodium content to 1,500 mg or less per day.    Polycythemia:   See abnormal CBC on 12/28/2022 .  Patient states that these values are secondary to taking testosterone.  He states that his dose has now been lowered..   Plan: Will do an anemia panel and ferritin level.  Elevated glucose:   His glucose has been elevated in the past, but denies history of diabetes.  Plan: Will do hemoglobin A1c and insulin level.   Previous labs reviewed today. Date: 12/28/2022  CMP, Lipids, and glucose and CBC  Labs done today CMP, Lipids, Insulin, HgbA1c, Vit D, Thyroid Panel, Anemia Panel, and Ferritin   Generalized Obesity: BMI (Calculated): 34.34   Tyrees is currently in the action stage of change and his goal is to begin weight loss efforts. I recommend Solomon begin the structured treatment plan as follows:  He has agreed to Category 4 Plan  Exercise goals: All adults should avoid inactivity. Some activity is better than none, and adults who participate in any amount of physical activity, gain some health benefits.  Behavioral modification strategies:increasing lean protein intake, increasing vegetables, increase H2O intake, increase high fiber foods, no skipping meals, meal planning and cooking strategies, and keeping healthy foods in the home  He was informed of the importance of frequent follow-up visits to maximize his success with intensive lifestyle modifications for his multiple health conditions. He was informed we would discuss his lab results at his next visit unless there is a critical issue that needs to be addressed sooner. Khrystian agreed to  keep his next visit at the agreed upon time to discuss these results.  Objective:  General: Cooperative, alert, well developed, in no acute distress. HEENT: Conjunctivae and lids unremarkable. Cardiovascular: Regular rhythm.  Lungs: Normal work of breathing. Neurologic: No focal deficits.   Lab Results  Component Value Date   CREATININE 1.06 12/28/2022   BUN 22 12/28/2022   NA 136 12/28/2022   K 4.4 12/28/2022   CL 101 12/28/2022   CO2 25 12/28/2022   Lab Results  Component Value Date   ALT 34 12/28/2022   AST 23 12/28/2022   ALKPHOS 48 12/28/2022   BILITOT 1.1 12/28/2022   Lab Results  Component Value Date   HGBA1C 5.0 12/18/2019   HGBA1C 5.4 12/12/2017   HGBA1C 5.7 11/25/2016   No results found for: "INSULIN" Lab Results  Component Value Date   TSH 2.43 07/23/2022   Lab Results  Component Value Date   CHOL 201 (H) 12/28/2022   HDL 33.60 (L) 12/28/2022   LDLCALC 118 (H) 03/05/2021   LDLDIRECT 123.0 12/28/2022   TRIG 220.0 (H) 12/28/2022   CHOLHDL 6 12/28/2022   Lab Results  Component Value Date   WBC 7.3 12/28/2022   HGB 18.2 Repeated and verified X2. (HH) 12/28/2022   HCT 53.3 (H) 12/28/2022   MCV 87.2 12/28/2022   PLT 207.0 12/28/2022   Lab Results  Component Value Date   IRON 111 07/02/2022   FERRITIN 105.2 07/02/2022    Attestation Statements:  Applicable history such as the following:  allergies, medications, problem list, medical history, surgical history, family history, social history, and previous encounter notes reviewed by clinician on day of visit:  Time spent on visit including the items listed below was 50 minutes.  -preparing to see the patient (e.g., review of tests, history, previous notes) -obtaining and/or reviewing separately obtained history -counseling and educating the patient/family/caregiver -documenting clinical information in the electronic or other health record -ordering medications, tests, or  procedures -independently interpreting results and communicating results to the patient/ family/caregiver -referring and communicating with other health care professionals  -care coordination   This may have been prepared with the assistance of Engineer, civil (consulting).  Occasional wrong-word or sound-a-like substitutions may have occurred due to the inherent limitations of voice recognition software.   Corinna Capra, DO

## 2023-12-28 LAB — ANEMIA PANEL
Ferritin: 123 ng/mL (ref 30–400)
Folate, Hemolysate: >620 ng/mL
Folate, RBC: >1097 ng/mL (ref >498)
Hematocrit: 56.5 % — ABNORMAL HIGH (ref 37.5–51.0)
Iron Saturation: 31 % (ref 15–55)
Iron: 103 ug/dL (ref 38–169)
Retic Ct Pct: 1.9 % (ref 0.6–2.6)
Total Iron Binding Capacity: 331 ug/dL (ref 250–450)
UIBC: 228 ug/dL (ref 111–343)
Vitamin B-12: 505 pg/mL (ref 232–1245)

## 2023-12-28 LAB — COMPREHENSIVE METABOLIC PANEL WITH GFR
ALT: 30 IU/L (ref 0–44)
AST: 21 IU/L (ref 0–40)
Albumin: 5 g/dL — ABNORMAL HIGH (ref 3.8–4.9)
Alkaline Phosphatase: 52 IU/L (ref 44–121)
BUN/Creatinine Ratio: 16 (ref 9–20)
BUN: 15 mg/dL (ref 6–24)
Bilirubin Total: 1.2 mg/dL (ref 0.0–1.2)
CO2: 22 mmol/L (ref 20–29)
Calcium: 9.9 mg/dL (ref 8.7–10.2)
Chloride: 102 mmol/L (ref 96–106)
Creatinine, Ser: 0.93 mg/dL (ref 0.76–1.27)
Globulin, Total: 1.8 g/dL (ref 1.5–4.5)
Glucose: 110 mg/dL — ABNORMAL HIGH (ref 70–99)
Potassium: 4.7 mmol/L (ref 3.5–5.2)
Sodium: 142 mmol/L (ref 134–144)
Total Protein: 6.8 g/dL (ref 6.0–8.5)
eGFR: 95 mL/min/{1.73_m2} (ref >59)

## 2023-12-28 LAB — TSH+T4F+T3FREE
Free T4: 0.9 ng/dL (ref 0.82–1.77)
T3, Free: 3.3 pg/mL (ref 2.0–4.4)
TSH: 2.11 u[IU]/mL (ref 0.450–4.500)

## 2023-12-28 LAB — LIPID PANEL WITH LDL/HDL RATIO
Cholesterol, Total: 192 mg/dL (ref 100–199)
HDL: 28 mg/dL — ABNORMAL LOW (ref >39)
LDL Chol Calc (NIH): 131 mg/dL — ABNORMAL HIGH (ref 0–99)
LDL/HDL Ratio: 4.7 ratio — ABNORMAL HIGH (ref 0.0–3.6)
Triglycerides: 185 mg/dL — ABNORMAL HIGH (ref 0–149)
VLDL Cholesterol Cal: 33 mg/dL (ref 5–40)

## 2023-12-28 LAB — VITAMIN D 25 HYDROXY (VIT D DEFICIENCY, FRACTURES): Vit D, 25-Hydroxy: 33.8 ng/mL (ref 30.0–100.0)

## 2023-12-28 LAB — HEMOGLOBIN A1C
Est. average glucose Bld gHb Est-mCnc: 108 mg/dL
Hgb A1c MFr Bld: 5.4 % (ref 4.8–5.6)

## 2023-12-28 LAB — INSULIN, RANDOM: INSULIN: 13 u[IU]/mL (ref 2.6–24.9)

## 2023-12-29 ENCOUNTER — Encounter: Payer: Self-pay | Admitting: Bariatrics

## 2023-12-29 DIAGNOSIS — R718 Other abnormality of red blood cells: Secondary | ICD-10-CM | POA: Insufficient documentation

## 2023-12-29 DIAGNOSIS — E785 Hyperlipidemia, unspecified: Secondary | ICD-10-CM | POA: Insufficient documentation

## 2023-12-30 ENCOUNTER — Encounter: Payer: Self-pay | Admitting: Internal Medicine

## 2023-12-30 ENCOUNTER — Ambulatory Visit (INDEPENDENT_AMBULATORY_CARE_PROVIDER_SITE_OTHER): Payer: 59 | Admitting: Internal Medicine

## 2023-12-30 VITALS — BP 136/80 | HR 54 | Temp 98.0°F | Resp 16 | Ht 76.0 in | Wt 285.0 lb

## 2023-12-30 DIAGNOSIS — I10 Essential (primary) hypertension: Secondary | ICD-10-CM | POA: Diagnosis not present

## 2023-12-30 DIAGNOSIS — E785 Hyperlipidemia, unspecified: Secondary | ICD-10-CM

## 2023-12-30 DIAGNOSIS — Z0000 Encounter for general adult medical examination without abnormal findings: Secondary | ICD-10-CM

## 2023-12-30 LAB — PSA: PSA: 1.01 ng/mL (ref 0.10–4.00)

## 2023-12-30 MED ORDER — HYDROCODONE-ACETAMINOPHEN 10-325 MG PO TABS: 1.0000 | ORAL_TABLET | ORAL | Status: AC | PRN

## 2023-12-30 NOTE — Assessment & Plan Note (Signed)
 Here for CPX  Other issues: HTN: Amlodipine, BP looks okay.  Recommend to check amb BPs from time to time OSA: On CPAP, good compliance Testosterone supplements: Currently not taking any supplements. Cardiovascular risk: 14%, qualify for statins, this was discussed with the patient, we agreed he will continue to work on his lifestyle. Back pain: On hydrocodone prescribed elsewhere. Increased hematocrit: Last hematocrit increased, recheck next year  RTC 1 year

## 2023-12-30 NOTE — Assessment & Plan Note (Signed)
 Here for CPX -Td 2020    - shingrix x2 - Rec flu shot q fall and a covid booster  CCS: Colonoscopy 01/2017, + polyps, cscope 04-2022, next per GI  Prostate cancer screening: Not on testosterone at this point, check PSA Recent labs reviewed, will check a PSA. Diet, exercise: Started to look better a few weeks ago, under the care of the wellness clinic

## 2023-12-30 NOTE — Patient Instructions (Addendum)
 Vaccines I recommend: Covid booster Flu shot every fall  Check the  blood pressure regularly Blood pressure goal:  between 110/65 and  135/85. If it is consistently higher or lower, let me know     GO TO THE LAB : Get the blood work     Please go to the front desk: Arrange for a visit exam in 1 year     "Health Care Power of attorney" (Also know as a  "Living will" or  Advance care planning documents)  If you already have a living will or healthcare power of attorney, is recommended you bring the copy to be scanned in your chart.   The document will be available to all the doctors you see in the system.  If you are over 59 y/o and don't have the document, please read:  Advance care planning is a process that supports adults in  understanding and sharing their preferences regarding future medical care.  The patient's preferences are recorded in documents called Advance Directives and the can be modified at any time while the patient is in full mental capacity.     More information at: [email or web address]

## 2023-12-30 NOTE — Progress Notes (Signed)
 Subjective:    Patient ID: Dylan Hatfield, male    DOB: 1965-12-09, 57 y.o.   MRN: [id number]  DOS:  12/30/2023 Type of visit - description: CPX  Here for CPX Few months ago his weight went up to 292 pounds. Started doing better w/ diet , was seen at the wellness clinic recently.  Wt Readings from Last 3 Encounters:  12/30/23 285 lb (129.3 kg)  12/27/23 282 lb (127.9 kg)  12/19/23 282 lb (127.9 kg)   BP Readings from Last 3 Encounters:  12/30/23 136/80  12/27/23 137/72  12/19/23 130/87    Review of Systems  Other than above, a 14 point review of systems is negative      Past Medical History:  Diagnosis Date   Arthritis    Chronic back pain    bilateral L5/S1 transforaminal lumbar epidural steroid injections, Dr. Pernell Dupre   DDD (degenerative disc disease), lumbosacral    Hypertension    Lumbosacral spondylosis    Sleep apnea    on CPAP, dx ~ 2008     Past Surgical History:  Procedure Laterality Date   COLONOSCOPY     POLYPECTOMY     VASECTOMY     Social History   Socioeconomic History   Marital status: Married    Spouse name: Not on file   Number of children: 2   Years of education: Not on file   Highest education level: Not on file  Occupational History   Occupation: Occupation-- Theatre manager: [employer]  Tobacco Use   Smoking status: Former    Passive exposure: Past (FATHER SMOKED)   Smokeless tobacco: Former   Tobacco comments:    1 ppd, quit 2003    Started ~ 1986    17 pack-year  Vaping Use   Vaping status: Never Used  Substance and Sexual Activity   Alcohol use: No   Drug use: No    Types: Hydrocodone   Sexual activity: Yes  Other Topics Concern   Not on file  Social History Narrative   Daughter: graduated from college   Son : graduated HS, works w/ father    Social Drivers of Corporate investment banker Strain: Not on Ship broker Insecurity: Not on file  Transportation Needs: Not on file  Physical  Activity: Not on file  Stress: Not on file  Social Connections: Not on file  Intimate Partner Violence: Not on file     Current Outpatient Medications  Medication Instructions   acyclovir (ZOVIRAX) 400 mg, Oral, 2 times daily   amLODipine (NORVASC) 5 mg, Oral, Daily   HYDROcodone-acetaminophen (NORCO) 10-325 MG per tablet 1 tablet, As needed   ibuprofen (ADVIL) 200 mg, Every 6 hours PRN   Multiple Vitamins-Minerals (MULTIVITAMIN ADULTS PO) Daily   Omega-3 Fatty Acids (FISH OIL PO) 2 times daily   OVER THE COUNTER MEDICATION Daily   OVER THE COUNTER MEDICATION 2 times daily   tamsulosin (FLOMAX) 0.4 MG CAPS capsule TAKE 1 CAPSULE(0.4 MG) BY MOUTH DAILY AFTER SUPPER   Testosterone 20.25 MG/ACT (1.62%) GEL 1 Pump, Transdermal, As directed, 1 pump to each shoulder daily       Objective:   Physical Exam BP 136/80   Pulse (!) 54   Temp 98 F (36.7 C) (Oral)   Resp 16   Ht 6\' 4"  (1.93 m)   Wt 285 lb (129.3 kg)   SpO2 97%   BMI 34.69 kg/m  General:  Well developed, NAD, BMI noted Neck: No  thyromegaly  HEENT:  Normocephalic . Face symmetric, atraumatic Lungs:  CTA B Normal respiratory effort, no intercostal retractions, no accessory muscle use. Heart: RRR,  no murmur.  Abdomen:  Not distended, soft, non-tender. No rebound or rigidity.   Lower extremities: no pretibial edema bilaterally  Skin: Exposed areas without rash. Not pale. Not jaundice Neurologic:  alert & oriented X3.  Speech normal, gait appropriate for age and unassisted Strength symmetric and appropriate for age.  Psych: Cognition and judgment appear intact.  Cooperative with normal attention span and concentration.  Behavior appropriate. No anxious or depressed appearing.     Assessment     Assessment  HTN OSA   Dx 2008, on CPAP MSK:DJD. Trigger fingers  Chronic back pain-- sees pain mngmt  LUTS -nocturia. Bony mass deformity of the chest wall, XR 06-2014 -- benign changes Lipoma: At the mid back,  diameter 10 cm    Palpitations: saw cards (echo 2018) Rx testosterone elsewhere, DC ~ 08-2021  PLAN   Here for CPX -Td 2020    - shingrix x2 - Rec flu shot q fall and a covid booster  CCS: Colonoscopy 01/2017, + polyps, cscope 04-2022, next per GI  Prostate cancer screening: Not on testosterone at this point, check PSA Recent labs reviewed, will check a PSA. Diet, exercise: Started to look better a few weeks ago, under the care of the wellness clinic Other issues: HTN: Amlodipine, BP looks okay.  Recommend to check amb BPs from time to time OSA: On CPAP, good compliance Testosterone supplements: Currently not taking any supplements. Cardiovascular risk: 14%, qualify for statins, this was discussed with the patient, we agreed he will continue to work on his lifestyle. Back pain: On hydrocodone prescribed elsewhere. Increased hematocrit: Last hematocrit increased, recheck next year  RTC 1 year

## 2024-01-02 ENCOUNTER — Ambulatory Visit (INDEPENDENT_AMBULATORY_CARE_PROVIDER_SITE_OTHER): Admitting: Family Medicine

## 2024-01-02 VITALS — BP 140/86 | HR 75 | Temp 97.8°F | Resp 15 | Ht 71.0 in | Wt 209.0 lb

## 2024-01-02 MED ORDER — TRIAMCINOLONE ACETONIDE 40 MG/ML IJ SUSP
40.0000 mg | Freq: Once | INTRAMUSCULAR | Status: AC
Start: 2024-01-02 — End: 2024-01-02
  Administered 2024-01-02: 40 mg

## 2024-01-02 NOTE — Assessment & Plan Note (Signed)
Active  Kenalog injection administered - 1 cc  Pt tolerated well  After care instructions given  Continue to monitor

## 2024-01-02 NOTE — Progress Notes (Signed)
 Encounter Date:  01/02/2024   PCP: Kara Pacer  MRN: [id number]  DOB: [birth year]     HPI:  Henry Norman, 58 year old male, presents   Chief Complaint   Patient presents with    Imm/Inj     Follow up on left knee pain. Requesting first cortisone injection.      Hx of torn ACL and PCL at 58 years old without post-op rehabilitation. Patient previously seen by ortho who recommended surgery. Patient consents to cortisone injection today.       CURRENT  MEDICATIONS:  Current Outpatient Medications on File Prior to Visit   Medication Sig Dispense Refill    hydrOXYzine HCL (ATARAX) 25 MG tablet Take 1 tablet (25 mg) by mouth 2 times daily.      mirtazapine (REMERON) 15 MG tablet Take 1 tablet (15 mg) by mouth nightly.  0     No current facility-administered medications on file prior to visit.     Outpatient Medications Marked as Taking for the 01/02/24 encounter (Office Visit) with Kara Pacer, MD   Medication Sig Dispense Refill    hydrOXYzine HCL (ATARAX) 25 MG tablet Take 1 tablet (25 mg) by mouth 2 times daily.      mirtazapine (REMERON) 15 MG tablet Take 1 tablet (15 mg) by mouth nightly.  0     Current Facility-Administered Medications for the 01/02/24 encounter (Office Visit) with Kara Pacer, MD   Medication Dose Route Frequency Provider Last Rate Last Admin    triamcinolone acetonide (KENALOG-40) 40 MG/ML injection 40 mg  40 mg Other Once Jolita Haefner, Tae-Woong, MD            ALLERGIES:    Allergies   Allergen Reactions    Fluoxetine Palpitations    Paroxetine Palpitations          REVIEW OF SYSTEMS:  Review of Systems   Constitutional:  Negative for chills and fever.   Respiratory:  Negative for cough and shortness of breath.    Cardiovascular:  Negative for chest pain.   Gastrointestinal:  Negative for abdominal pain, diarrhea, nausea and vomiting.   Musculoskeletal:  Positive for arthralgias.         PHYSICAL EXAM:   01/02/24  1607   BP: 140/86   Pulse: 75   Temp: 97.8 F (36.6 C)   Resp: 15     Body mass index is  29.15 kg/m.    Ht Readings from Last 1 Encounters:   01/02/24 5\' 11"  (1.803 m)     Wt Readings from Last 1 Encounters:   01/02/24 94.8 kg (209 lb)         Physical Exam  Vitals and nursing note reviewed. Exam conducted with a chaperone present.   Constitutional:       Appearance: Normal appearance.   Neurological:      Mental Status: He is alert and oriented to person, place, and time.   Psychiatric:         Mood and Affect: Mood normal.         Behavior: Behavior normal.         Thought Content: Thought content normal.         Judgment: Judgment normal.           ASSESSMENT & PLAN:    Kailer was seen today for imm/inj.    Diagnoses and all orders for this visit:    Chronic pain of left knee  Assessment & Plan:  Active  Kenalog  injection administered - 1 cc  Pt tolerated well  After care instructions given  Continue to monitor    Orders:  -     triamcinolone acetonide (KENALOG-40) 40 MG/ML injection 40 mg  -     Injection / Drainage / Aspiration of Joint or Bursa          ICD-10-CM ICD-9-CM    1. Chronic pain of left knee  M25.562 719.46 triamcinolone acetonide (KENALOG-40) 40 MG/ML injection 40 mg    G89.29 338.29 Injection / Drainage / Aspiration of Joint or Bursa            By signing below, I acknowledge that I have reviewed the above note,  dictated by me and scribed by Dellie Catholic for accuracy and edited  where necessary. The note accurately reflects the services provided at this  encounter. We retain the right to modify this information in the event of  errors. Occasional errors in punctuation, grammar and content may occur.        Electronically reviewed and signed by Kara Pacer, M.D.      [facility]  [email or web address]

## 2024-01-02 NOTE — Procedures (Signed)
Joint Injection (Knee Injection)      Left intra-articular Knee Steroid Injection     Medications used in left knee injected:   2 ccs of 1% lidocaine and 1 ccs of Kenalog (40mg /ml)    Informed consent was obtained.     The patient was brought to the examination room and placed in a seated position.     The lower extremity was placed in a free-fall, gravity-dependent position, flexed 90 degrees at the knee.     The medial area (arthroscopic port) to be injected was then cleansed thoroughly with an iodine swab and rubbing alcohol.     Next, using sterile technique, a 27 gauge, 1.5 inch-long needle was inserted through this port to the articulation of the knee.     There was no fluid on aspiration. The above medication solution was then gradually injected without difficulty. No paresthesias were reported by the patient. The needle was then withdrawn. No excessive bleeding occurred. The injection site was sterilely dressed. The patient tolerated the procedure well. There were no complications.     The patient was observed for an adequate period of time post injection. Thorough discharge instructions and precautions were reviewed with the patient. The patient was discharged home. The patient's pain was well-controlled and there were no neurological deficits.     I explained to the patient that he/she may have pain relief for the next several hours due to the anesthetic but that it could take 2-4 days for the steroid medication to begin working to provide adequate pain relief.     The patient was asked to contact us if any questions arose, preferably through the patient portal.    Note: Injection code 20610/J3301 for each joint injected      Reviewed and Signed By Kara Pacer, MD

## 2024-01-10 ENCOUNTER — Ambulatory Visit: Admitting: Bariatrics

## 2024-01-10 ENCOUNTER — Encounter: Payer: Self-pay | Admitting: Bariatrics

## 2024-01-10 VITALS — BP 153/88 | HR 72 | Temp 97.5°F | Ht 76.0 in | Wt 278.0 lb

## 2024-01-10 DIAGNOSIS — Z6833 Body mass index (BMI) 33.0-33.9, adult: Secondary | ICD-10-CM

## 2024-01-10 DIAGNOSIS — I10 Essential (primary) hypertension: Secondary | ICD-10-CM | POA: Diagnosis not present

## 2024-01-10 DIAGNOSIS — E669 Obesity, unspecified: Secondary | ICD-10-CM

## 2024-01-10 DIAGNOSIS — E785 Hyperlipidemia, unspecified: Secondary | ICD-10-CM

## 2024-01-10 DIAGNOSIS — G4733 Obstructive sleep apnea (adult) (pediatric): Secondary | ICD-10-CM

## 2024-01-10 DIAGNOSIS — E66811 Obesity, class 1: Secondary | ICD-10-CM

## 2024-01-10 NOTE — Progress Notes (Signed)
 First follow-up after initial visit.        WEIGHT SUMMARY AND BIOMETRICS  Weight Lost Since Last Visit: 4lb  Weight Gained Since Last Visit: 0   Vitals Temp: (!) 97.5 F (36.4 C) BP: (!) 153/88 Pulse Rate: 72 SpO2: 95 %   Anthropometric Measurements Height: 6\' 4"  (1.93 m) Weight: 278 lb (126.1 kg) BMI (Calculated): 33.85 Weight at Last Visit: 282lb Weight Lost Since Last Visit: 4lb Weight Gained Since Last Visit: 0 Starting Weight: 282lb Total Weight Loss (lbs): 4 lb (1.814 kg) Peak Weight: 292lb   Body Composition  Body Fat %: 29 % Fat Mass (lbs): 80.8 lbs Muscle Mass (lbs): 188.2 lbs Total Body Water (lbs): 135.4 lbs Visceral Fat Rating : 17   Other Clinical Data Fasting: no Labs: no Today's Visit #: 2 Starting Date: 12/27/23    OBESITY Dylan Hatfield is here to discuss his progress with his obesity treatment plan along with follow-up of his obesity related diagnoses.    Nutrition Plan: the Category 4 plan - 95% adherence.  Current exercise: no regular exercise  Interim History:  He is down 4 lbs since his initial visit.  Eating all of the food on the plan., Protein intake is as prescribed, and Water intake is adequate.  Initial positives regarding the dietary plan: " it has been easy"  Initial challenges regarding  the dietary plan: excluding snacks at a convenience mart.  Hunger is moderately controlled.  Cravings are moderately controlled.  Assessment/Plan:   Labs reviewed today (CMP, Lipids, HgbA1c, insulin, and thyroid panel).   Dyslipidemia LDL is not at goal. Medication(s): none Cardiovascular risk factors: advanced age (older than 49 for men, 12 for women), dyslipidemia, obesity (BMI >= 30 kg/m2), and sedentary lifestyle  Lab Results  Component Value Date   CHOL 192 12/27/2023   HDL 28 (L) 12/27/2023   LDLCALC 131 (H)  12/27/2023   LDLDIRECT 123.0 12/28/2022   TRIG 185 (H) 12/27/2023   CHOLHDL 6 12/28/2022   Lab Results  Component Value Date   ALT 30 12/27/2023   AST 21 12/27/2023   ALKPHOS 52 12/27/2023   BILITOT 1.2 12/27/2023   The 10-year ASCVD risk score (Arnett DK, et al., 2019) is: 17.1%   Values used to calculate the score:     Age: 58 years     Sex: Male     Is Non-Hispanic African American: No     Diabetic: No     Tobacco smoker: No     Systolic Blood Pressure: 153 mmHg     Is BP treated: Yes     HDL Cholesterol: 28 mg/dL     Total Cholesterol: 192 mg/dL  Plan:  Discussed with him that he is ASCVD score is elevated and if he is at a level where a statin or other appropriate medication would be warranted.  He does not want to begin any medication at this time.  Will further discuss this with his PCP at his next visit. Information sheet on healthy vs unhealthy fats.  Will avoid all trans fats.  Will read labels Will minimize saturated fats except the following: low fat meats in moderation, diary, and limited dark chocolate.  Increase Omega 3 in foods, and consider an Omega 3 supplement.    Hypertension Hypertension control uncertain.  Medication(s): Amlodipine 5 mg 1 daily  He was rushing to the office this morning.  BP Readings from Last 3 Encounters:  01/10/24 (!) 153/88  12/30/23 136/80  12/27/23 137/72  Lab Results  Component Value Date   CREATININE 0.93 12/27/2023   CREATININE 1.06 12/28/2022   CREATININE 0.89 07/23/2022   Lab Results  Component Value Date   GFR 77.89 12/28/2022   GFR 95.40 07/23/2022   GFR 88.03 07/02/2022    Plan: We will look at ways to decrease his stress level.  Continue all antihypertensives at current dosages. No added salt. Will keep sodium content to 1,500 mg or less per day.    Obstructive Sleep Apnea Dylan Hatfield has a diagnosis of sleep apnea. He reports that he is using a CPAP regularly. Reports restful sleep.   Plan: Continue CPAP  therapy. Continue to practice good sleep hygiene.  Will add in elements of an antiinflammatory diet and  add in elements of a Mediterranean diet.  Reduce intake of carbohydrates for weight loss.       Generalized Obesity: Current BMI BMI (Calculated): 33.85    Dylan Hatfield is currently in the action stage of change. As such, his goal is to continue with weight loss efforts.  He has agreed to the Category 4 plan.  Exercise goals: All adults should avoid inactivity. Some physical activity is better than none, and adults who participate in any amount of physical activity gain some health benefits.  Behavioral modification strategies: increasing lean protein intake, decreasing simple carbohydrates , no meal skipping, meal planning , decrease liquid calories, increase water intake, better snacking choices, planning for success, increasing vegetables, avoiding temptations, keep healthy foods in the home, weigh protein portions, and mindful eating.  Dylan Hatfield has agreed to follow-up with our clinic in 2 weeks.   Labs reviewed today from last visit (CMP, Lipids, HgbA1c, insulin, vitamin D, B 12, and thyroid panel).   Objective:   VITALS: Per patient if applicable, see vitals. GENERAL: Alert and in no acute distress. CARDIOPULMONARY: No increased WOB. Speaking in clear sentences.  PSYCH: Pleasant and cooperative. Speech normal rate and rhythm. Affect is appropriate. Insight and judgement are appropriate. Attention is focused, linear, and appropriate.  NEURO: Oriented as arrived to appointment on time with no prompting.   Attestation Statements:   This was prepared with the assistance of Engineer, civil (consulting).  Occasional wrong-word or sound-a-like substitutions may have occurred due to the inherent limitations of voice recognition software.   Kirk Peper, DO

## 2024-01-11 ENCOUNTER — Ambulatory Visit (INDEPENDENT_AMBULATORY_CARE_PROVIDER_SITE_OTHER): Admitting: Family

## 2024-01-11 ENCOUNTER — Encounter (INDEPENDENT_AMBULATORY_CARE_PROVIDER_SITE_OTHER): Payer: Self-pay | Admitting: Family

## 2024-01-11 VITALS — BP 140/90 | HR 84 | Temp 97.8°F | Ht 71.0 in | Wt 204.2 lb

## 2024-01-11 LAB — COMPREHENSIVE METABOLIC PANEL, BLOOD
ALT (SGPT): 15 U/L (ref 9–46)
AST (SGOT): 17 U/L (ref 10–35)
Albumin/Glob Ratio: 2.1 (calc) (ref 1.0–2.5)
Albumin: 4.8 g/dL (ref 3.6–5.1)
Alkaline Phos: 59 U/L (ref 35–144)
BUN: 17 mg/dL (ref 7–25)
Bilirubin, Total: 0.6 mg/dL (ref 0.2–1.2)
Calcium: 10 mg/dL (ref 8.6–10.3)
Carbon Dioxide: 33 mmol/L — ABNORMAL HIGH (ref 20–32)
Chloride: 100 mmol/L (ref 98–110)
Creatinine: 1.03 mg/dL (ref 0.70–1.30)
EGFR: 84 mL/min/{1.73_m2} (ref >=60)
Globulin: 2.3 g/dL (ref 1.9–3.7)
Glucose: 89 mg/dL (ref 65–99)
Potassium: 4.9 mmol/L (ref 3.5–5.3)
Sodium: 138 mmol/L (ref 135–146)
Total Protein: 7.1 g/dL (ref 6.1–8.1)

## 2024-01-11 LAB — CBC WITH DIFF, BLOOD
Abs Basophils: 27 {cells}/uL (ref 0–200)
Abs Eosinophils: 109 {cells}/uL (ref 15–500)
Abs Lymphs: 2033 {cells}/uL (ref 850–3900)
Abs Monocytes: 490 {cells}/uL (ref 200–950)
Abs Neutrophils: 4141 {cells}/uL (ref 1500–7800)
Basophils: 0.4 %
Eosinophils: 1.6 %
HCT: 51.8 % — ABNORMAL HIGH (ref 38.5–50.0)
HGB: 17.2 g/dL — ABNORMAL HIGH (ref 13.2–17.1)
Lymps: 29.9 %
MCH: 29.6 pg (ref 27.0–33.0)
MCHC: 33.2 g/dL (ref 32.0–36.0)
MCV: 89.2 fL (ref 80.0–100.0)
MPV: 9.1 fL (ref 7.5–12.5)
Monocytes: 7.2 %
PLT: 261 10*3/uL (ref 140–400)
RBC: 5.81 10*6/uL — ABNORMAL HIGH (ref 4.20–5.80)
RDW: 12.2 % (ref 11.0–15.0)
SEGS: 60.9 %
WBC: 6.8 10*3/uL (ref 3.8–10.8)

## 2024-01-11 MED ORDER — BUDESONIDE-FORMOTEROL FUMARATE 80-4.5 MCG/ACT IN AERO
2.0000 | INHALATION_SPRAY | Freq: Two times a day (BID) | RESPIRATORY_TRACT | 0 refills | Status: DC
Start: 2024-01-11 — End: 2024-02-07

## 2024-01-11 NOTE — Telephone Encounter (Signed)
 Patient has an appointment with Patterson Bora NP today. Should discuss with her.

## 2024-01-11 NOTE — Progress Notes (Signed)
 [place] - [place]-  [place] - [place] - [place]   [email or web address] and [email or web address]  Telephone: [phone]      CONCERN     Encounter Date: 01/11/2024  3:20 PM PDT    PCP: Valera Starring  MRN: [id number]   DOB: January 12, 1966     CONCERN:   Henry Norman is a 58 year old male presents   Chief Complaint   Patient presents with    Weight Loss     Rapid weight loss. Has lost 5 lbs from last week.   Has totally lost 20 lbs total.      Rapid Weight Loss, Shortness of Breath, and Former Smoker      01/11/2024     3:12 PM 01/02/2024     4:07 PM 03/21/2023     3:59 PM 11/05/2020     4:56 PM 08/29/2017    12:35 PM 07/04/2017     4:57 PM   Date Weight Recorded   Metric 92.625 kg 94.802 kg 102.059 kg 98.249 kg 96.616 kg 95.074 kg   Pounds/Ounces 204 lb 3.2 oz 209 lb 225 lb 216 lb 9.6 oz 213 lb 209 lb 9.6 oz      Patient complains of rapid weight loss and a change in his breathing  Has lost 5 lbs over the last week and has lost 20 lbs in total  He admits that he had changed his diet to be a bit healthier  Lost 16 lbs in two weeks   Has last 5 lbs since last week  He is a former smoker  Admits to being slightly short of breath for the last 2-3 weeks  5-6 weeks ago, he went to [facility] due to shortness of breath   He was having minor PND and cold symptoms about 2 weeks ago but they only lasted a few days  Reports that his appetite has slightly decreased  He can still eat but it doesn't taste as good      Other chronic conditions, if discussed, are documented below in the A/P  Problem List and Family history in the EMR have been reviewed and updated as appropriate    ASSESSMENT/   PLAN       Edem was seen today for weight loss.    Diagnoses and all orders for this visit:    Rapid weight loss  Assessment & Plan:  Active  Ordered CT Abdomen and labs  Review results with patient when available  Continue to monitor  Follow up pending results     Orders:  -     CBC w/ Diff Lavender  -     Comprehensive Metabolic Panel  -      TSH w/Reflex to FT4  -     Lipid Panel Green Plasma Separator Tube  -     CT Abdomen W/WO Contrast    Former smoker  Assessment & Plan:  Stable  Ordered Smoker's CT  Review results with patient when available  Continue to monitor  Follow up pending results     Orders:  -     Spirometry  -     CT Smokers Lung Screening - NON MEDICARE as of 10-25-14    Shortness of breath  Assessment & Plan:  Active  Start Symbicort  80-4.5 mg inhaler as directed  Ordered Smoker's CT and labs  Review results with patient when available  Ambulatory O2: 94 to 93 when ambulatory  Spirometry: FEV1: 116% of predicted value with restriction  Continue to monitor  Follow up pending results     Orders:  -     Spirometry  -     CT Smokers Lung Screening - NON MEDICARE as of 10-25-14  -     Comprehensive Metabolic Panel  -     TSH w/Reflex to FT4  -     Lipid Panel Green Plasma Separator Tube  -     budesonide -formoterol  (SYMBICORT ) 80-4.5 MCG/ACT inhaler; Inhale 2 puffs by mouth every 12 hours.    Hyperlipidemia, unspecified hyperlipidemia type  Assessment & Plan:  Stable  Lab Results   Component Value Date    CHOL 194 04/29/2023    HDL 53 04/29/2023    LDL 121 (H) 04/29/2023    TRIG 101 04/29/2023       Ordered labs  Review results with patient when available  150 minutes of exercise weekly advised.   Low fat, saturated and trans fats and a high fiber diet.   Over the counter omega 3 fatty acids, fish oils advised- 2 grams or 2000mg  at night.   Continue to monitor   Follow up upon lab results.    Recommend the following:  Eating a heart-healthy diet that is rich in fruits, vegetables, whole grains, lean proteins, and healthy fats.   Avoiding foods high in saturated and trans fats, such as fatty meats, fried foods, and processed snacks.  Physical activity can help increase HDL (good) cholesterol and lower LDL (bad) cholesterol levels.   Aim for at least 30 minutes of moderate-intensity exercise most days of the week.  Quit smoking, which can  raise LDL cholesterol levels and increase the risk of heart disease.  Practice stress-reducing techniques like yoga, meditation, or deep breathing exercises.     Some specific diets that have been shown to improve cholesterol levels include:  Mediterranean diet: This diet emphasizes the consumption of fruits, vegetables, whole grains, nuts, seeds, legumes, fish, and healthy fats, such as olive oil. It is associated with lower LDL cholesterol levels and a reduced risk of heart disease.  DASH diet: The Dietary Approaches to Stop Hypertension (DASH) diet is designed to lower blood pressure, but it has also been shown to improve cholesterol levels. It emphasizes the consumption of fruits, vegetables, whole grains, lean proteins, and low-fat dairy products.  Plant-based diets: Vegetarian and vegan diets have been shown to lower cholesterol levels, due to their high fiber content and low intake of saturated fats.    Orders:  -     CBC w/ Diff Lavender  -     Comprehensive Metabolic Panel  -     TSH w/Reflex to FT4  -     Lipid Panel Green Plasma Separator Tube    Encounter for screening prostate specific antigen (PSA) measurement  Assessment & Plan:  Stable  Ordered PSA  Review results with patient when available  Continue to monitor  Follow up pending results     Orders:  -     PSA (Screen), Blood          ICD-10-CM ICD-9-CM    1. Rapid weight loss  R63.4 783.21 CBC w/ Diff Lavender      Comprehensive Metabolic Panel      TSH w/Reflex to FT4      Lipid Panel Green Plasma Separator Tube      CT Abdomen W/WO Contrast      2. Former smoker  Z87.891 V15.82 Spirometry      CT Smokers Lung Screening -  NON MEDICARE as of 10-25-14      3. Shortness of breath  R06.02 786.05 Spirometry      CT Smokers Lung Screening - NON MEDICARE as of 10-25-14      Comprehensive Metabolic Panel      TSH w/Reflex to FT4      Lipid Panel Green Plasma Separator Tube      budesonide -formoterol  (SYMBICORT ) 80-4.5 MCG/ACT inhaler      4.  Hyperlipidemia, unspecified hyperlipidemia type  E78.5 272.4 CBC w/ Diff Lavender      Comprehensive Metabolic Panel      TSH w/Reflex to FT4      Lipid Panel Green Plasma Separator Tube      5. Encounter for screening prostate specific antigen (PSA) measurement  Z12.5 V76.44 PSA (Screen), Blood              SUPPORTING OBJECTIVE  / SUBJECTIVE     PHYSICAL EXAM:   01/11/24  1512   BP: 140/90   Pulse: 84   Temp: 97.8 F (36.6 C)     Ht Readings from Last 1 Encounters:   01/11/24 5' 11 (1.803 m)     Wt Readings from Last 1 Encounters:   01/11/24 92.6 kg (204 lb 3.2 oz)     Body mass index is 28.48 kg/m.        01/11/2024     3:12 PM 01/02/2024     4:07 PM 03/21/2023     3:59 PM 11/05/2020     4:56 PM 08/29/2017    12:35 PM 07/04/2017     4:57 PM   Date Weight Recorded   Metric 92.625 kg 94.802 kg 102.059 kg 98.249 kg 96.616 kg 95.074 kg   Pounds/Ounces 204 lb 3.2 oz 209 lb 225 lb 216 lb 9.6 oz 213 lb 209 lb 9.6 oz        Physical Exam  Vitals and nursing note reviewed. Exam conducted with a chaperone present.   Constitutional:       General: He is not in acute distress.     Appearance: Normal appearance. He is not ill-appearing or toxic-appearing.   HENT:      Head: Normocephalic and atraumatic.   Cardiovascular:      Rate and Rhythm: Normal rate and regular rhythm.   Pulmonary:      Effort: Pulmonary effort is normal.   Skin:     General: Skin is warm.   Neurological:      Mental Status: He is alert and oriented to person, place, and time.   Psychiatric:         Mood and Affect: Mood normal.         Behavior: Behavior normal.         Thought Content: Thought content normal.         Judgment: Judgment normal.           ALLERGY / MEDICATIONS / SURGICAL  HISTORY /  SOCIAL HISTORY / IMMUNICATIONS / ROS     ALLERGIES:  Allergies   Allergen Reactions    Fluoxetine Palpitations    Paroxetine Palpitations          CURRENT  MEDICATIONS:  No outpatient medications have been marked as taking for the 01/11/24 encounter (Office Visit)  with Elvin Sober, NP.          PAST SURGICAL HISTORY:  Past Surgical History:   Procedure Laterality Date    KNEE SURGERY Left 09/27/2004    Meniscus Tear  KNEE SURGERY Left 09/27/1986    Torn ACL    SHOULDER SURGERY Left     labrum tear        SOCIAL HISTORY:  Socioeconomic History    Marital status: Married   Tobacco Use    Smoking status: Former     Current packs/day: 0.00     Types: Cigarettes     Quit date: 2006     Years since quitting: 19.3    Smokeless tobacco: Former   Substance and Sexual Activity    Alcohol use: No       Immunization History   Administered Date(s) Administered    COVID-19 (Moderna) Red Cap >= 12 Years 12/24/2019, 01/21/2020    Hep-A Vaccine, Adult Dose 07/22/2016    Influenza Vaccine (High Dose) Quadrivalent >=65 Years 08/18/2015    Influenza Vaccine (Unspecified) 08/29/2012, 07/22/2016    Influenza Vaccine >=6 Months 07/09/2010, 07/16/2011, 07/28/2013    Tdap 07/09/2010, 03/21/2023         REVIEW OF SYSTEMS:   (unless listed below, the ROS  is negative except as listed in HPI)  Review of Systems   Constitutional:  Positive for unexpected weight change. Negative for activity change, appetite change, chills, fatigue and fever.   HENT:  Negative for congestion, rhinorrhea, sore throat and trouble swallowing.    Respiratory:  Positive for shortness of breath. Negative for cough and wheezing.    Cardiovascular:  Negative for chest pain.   Gastrointestinal:  Negative for abdominal pain, diarrhea, nausea and vomiting.   Genitourinary:  Negative for difficulty urinating.   Musculoskeletal:  Negative for gait problem.   Skin:  Negative for rash.   Neurological:  Negative for weakness and numbness.   Psychiatric/Behavioral:  Negative for agitation and sleep disturbance.    All other systems reviewed and are negative.    Spirometry Results: FEV1: 116% of predicted value with restriction        By signing below, I acknowledge that I have reviewed the above note,  dictated by me and scribed by  Wells Coffin, for accuracy and edited  where necessary. The note accurately reflects the services provided at this  encounter. We retain the right to modify this information in the event of  errors. Occasional errors in punctuation, grammar and content may occur.      Electronically signed and reviewed by Barnie Faith, NP    [facility]  [email or web address]

## 2024-01-11 NOTE — Procedures (Signed)
 Spirometry Results: FEV1: 116% of predicted value with restriction

## 2024-01-11 NOTE — Assessment & Plan Note (Addendum)
 Active  Start Symbicort 80-4.5 mg inhaler as directed  Ordered Smoker's CT and labs  Review results with patient when available  Ambulatory O2: 94 to 93 when ambulatory  Spirometry: FEV1: 116% of predicted value with restriction  Continue to monitor  Follow up pending results

## 2024-01-11 NOTE — Assessment & Plan Note (Signed)
 Active  Ordered CT Abdomen and labs  Review results with patient when available  Continue to monitor  Follow up pending results

## 2024-01-11 NOTE — Assessment & Plan Note (Signed)
 Stable  Ordered PSA  Review results with patient when available  Continue to monitor  Follow up pending results

## 2024-01-11 NOTE — Assessment & Plan Note (Signed)
 Stable  Ordered Smoker's CT  Review results with patient when available  Continue to monitor  Follow up pending results

## 2024-01-11 NOTE — Assessment & Plan Note (Signed)
 Stable  Lab Results   Component Value Date    CHOL 194 04/29/2023    HDL 53 04/29/2023    LDL 121 (H) 04/29/2023    TRIG 101 04/29/2023       Ordered labs  Review results with patient when available  150 minutes of exercise weekly advised.   Low fat, saturated and trans fats and a high fiber diet.   Over the counter omega 3 fatty acids, fish oils advised- 2 grams or 2000mg  at night.   Continue to monitor   Follow up upon lab results.    Recommend the following:  Eating a heart-healthy diet that is rich in fruits, vegetables, whole grains, lean proteins, and healthy fats.   Avoiding foods high in saturated and trans fats, such as fatty meats, fried foods, and processed snacks.  Physical activity can help increase HDL (good) cholesterol and lower LDL (bad) cholesterol levels.   Aim for at least 30 minutes of moderate-intensity exercise most days of the week.  Quit smoking, which can raise LDL cholesterol levels and increase the risk of heart disease.  Practice stress-reducing techniques like yoga, meditation, or deep breathing exercises.     Some specific diets that have been shown to improve cholesterol levels include:  Mediterranean diet: This diet emphasizes the consumption of fruits, vegetables, whole grains, nuts, seeds, legumes, fish, and healthy fats, such as olive oil. It is associated with lower LDL cholesterol levels and a reduced risk of heart disease.  DASH diet: The Dietary Approaches to Stop Hypertension (DASH) diet is designed to lower blood pressure, but it has also been shown to improve cholesterol levels. It emphasizes the consumption of fruits, vegetables, whole grains, lean proteins, and low-fat dairy products.  Plant-based diets: Vegetarian and vegan diets have been shown to lower cholesterol levels, due to their high fiber content and low intake of saturated fats.

## 2024-01-12 ENCOUNTER — Telehealth (INDEPENDENT_AMBULATORY_CARE_PROVIDER_SITE_OTHER): Payer: Self-pay | Admitting: Family Medicine

## 2024-01-12 LAB — TRIGLYCERIDES, BLOOD: Triglycerides: 114 mg/dL (ref <150)

## 2024-01-12 LAB — PSA (SCREEN), BLOOD: PSA, Total: 0.79 ng/mL (ref <=4.00)

## 2024-01-12 LAB — CHOLESTEROL, TOTAL BLOOD: Cholesterol: 223 mg/dL — ABNORMAL HIGH (ref <200)

## 2024-01-12 LAB — LDL CHOLESTEROL, DIRECT: LDL-Cholesterol: 137 mg/dL — ABNORMAL HIGH

## 2024-01-12 LAB — CHOLESTEROL/HDLC RATIO-QUEST: Chol/HDLC Ratio: 3.5 (calc) (ref <5.0)

## 2024-01-12 LAB — HDL-CHOLESTEROL, BLOOD: HDL Cholesterol: 63 mg/dL (ref >=40)

## 2024-01-12 LAB — TSH W/REFLEX TO FT4-QUEST: TSH: 2.02 m[IU]/L (ref 0.40–4.50)

## 2024-01-12 LAB — NON HDL CHOLESTEROL -QUEST: Non-HDL Cholesterol: 160 mg/dL — ABNORMAL HIGH (ref <130)

## 2024-01-12 NOTE — Telephone Encounter (Signed)
 Incoming call from Amy with [facility] stating that per their radiologist , they suggest that a new order for a CT chest scan w/o contrast be sent in for pt to complete. They stated pt shouldn't do a CT smoker lung screening since it has been 15 years since pt has last smoked.     Fax # [phone]    Please advise, thank you !

## 2024-01-12 NOTE — Result Encounter Note (Signed)
 Call pt w/ results.     Bad cholesterol is increasing. Along w/ your risk for heart disease.     A Mediterranean diet should be followed, low sat and trans fats. Mod to intense exercise , 5 days a week. Omega 3 Fish oils. You want 1200 mg between the DHA and EPA on the back label and it is to be taken at bedtime.     The non hdl is elevated. This indicates increased risk for heart disease. So we have to make sure we are exercising, eating right, getting 30-35 grams of fiber per day, yoga, antiinflammatory diet and activities.    We will check this annually.     Your rbc's are high. Have you ever been evaluated for  high rbc's? This is not significant for what he was being seen for. But needs to be addressed. Not taking testosterone therapy, correct?    We will see what the CT will show us  about the wt loss.

## 2024-01-12 NOTE — Telephone Encounter (Signed)
 FAXED ORDER TO [facility]  FAX# [phone]

## 2024-01-13 ENCOUNTER — Telehealth (INDEPENDENT_AMBULATORY_CARE_PROVIDER_SITE_OTHER): Payer: Self-pay | Admitting: Family

## 2024-01-13 ENCOUNTER — Encounter (INDEPENDENT_AMBULATORY_CARE_PROVIDER_SITE_OTHER): Payer: Self-pay | Admitting: Hospital

## 2024-01-13 DIAGNOSIS — R0602 Shortness of breath: Secondary | ICD-10-CM

## 2024-01-13 DIAGNOSIS — R590 Localized enlarged lymph nodes: Secondary | ICD-10-CM

## 2024-01-13 DIAGNOSIS — J9811 Atelectasis: Secondary | ICD-10-CM

## 2024-01-13 DIAGNOSIS — J439 Emphysema, unspecified: Secondary | ICD-10-CM

## 2024-01-13 DIAGNOSIS — R911 Solitary pulmonary nodule: Secondary | ICD-10-CM

## 2024-01-13 DIAGNOSIS — R634 Abnormal weight loss: Secondary | ICD-10-CM

## 2024-01-13 DIAGNOSIS — Z87891 Personal history of nicotine dependence: Secondary | ICD-10-CM

## 2024-01-13 NOTE — Telephone Encounter (Signed)
 Was referral faxed to [facility]? Fax if not done. Pulmo inform pt

## 2024-01-13 NOTE — Telephone Encounter (Signed)
 Patient wife called in to follow up on referral. She provided info on where to have order faxed to. [facility] [phone].

## 2024-01-13 NOTE — Result Encounter Note (Signed)
 Call pt w/ findings:    Pt has enlarged lymph glands in his chest.  Labs appear normal. But that is not. He needs to see pulmonology for that.  But also for these additional findings.  His  lungs show a collapse which was seen early in urgent xrays.  Atelectasis showing emphysema. That could explain his shortness of breath. Not necessarily the wt loss. Tissue  He needs to see pulmo.     Small cyst on liver. Not a concern at this time. I will submit the referral.

## 2024-01-13 NOTE — Telephone Encounter (Signed)
 Refer to pulmo. Stat d/t rapid wt loss and findings on ct

## 2024-01-16 ENCOUNTER — Telehealth (INDEPENDENT_AMBULATORY_CARE_PROVIDER_SITE_OTHER): Payer: Self-pay | Admitting: Family

## 2024-01-16 NOTE — Telephone Encounter (Signed)
-----   Message from Barnie CROME sent at 01/13/2024  7:16 AM PDT -----  Call pt w/ findings:    Pt has enlarged lymph glands in his chest.  Labs appear normal. But that is not. He needs to see pulmonology for that.  But also for these additional findings.  His  lungs show a collapse which was seen early in urgent xrays.  Atelectasis showing emphysema. That could explain his shortness of breath. Not necessarily the wt loss. Tissue  He needs to see pulmo.     Small cyst on liver. Not a concern at this time. I will submit the referral.

## 2024-01-16 NOTE — Telephone Encounter (Signed)
-----   Message from Barnie CROME sent at 01/12/2024  9:32 AM PDT -----  Call pt w/ results.     Bad cholesterol is increasing. Along w/ your risk for heart disease.     A Mediterranean diet should be followed, low sat and trans fats. Mod to intense exercise , 5 days a week. Omega 3 Fish oils. You want 1200 mg between the DHA and EPA on the back label and it is to be taken at bedtime.     The non hdl is elevated. This indicates increased risk for heart disease. So we have to make sure we are exercising, eating right, getting 30-35 grams of fiber per day, yoga, antiinflammatory diet and activities.    We will check this annually.     Your rbc's are high. Have you ever been evaluated for  high rbc's? This is not significant for what he was being seen for. But needs to be addressed. Not taking testosterone therapy, correct?    We will see what the CT will show us  about the wt loss.

## 2024-01-17 ENCOUNTER — Encounter (INDEPENDENT_AMBULATORY_CARE_PROVIDER_SITE_OTHER): Payer: Self-pay

## 2024-01-17 NOTE — Telephone Encounter (Signed)
 Faxed to number provided

## 2024-01-18 ENCOUNTER — Other Ambulatory Visit (INDEPENDENT_AMBULATORY_CARE_PROVIDER_SITE_OTHER): Payer: Self-pay

## 2024-01-19 ENCOUNTER — Encounter (INDEPENDENT_AMBULATORY_CARE_PROVIDER_SITE_OTHER): Payer: Self-pay | Admitting: Family

## 2024-01-20 ENCOUNTER — Telehealth (INDEPENDENT_AMBULATORY_CARE_PROVIDER_SITE_OTHER): Payer: Self-pay

## 2024-01-20 NOTE — Telephone Encounter (Signed)
 Patient has a STAT referral for pulmonary that did not fall into [facility]. Please review and advise on scheduling.

## 2024-01-22 NOTE — Progress Notes (Unsigned)
  Electrophysiology Office Note:   Date:  01/22/2024  ID:  Dylan Hatfield, DOB 06-14-1966, MRN [id number]  Primary Cardiologist: None Primary Heart Failure: None Electrophysiologist: Aiyonna Lucado Cortland Ding, MD  {Click to update primary MD,subspecialty MD or APP then REFRESH:1}    History of Present Illness:   Dylan Hatfield is a 58 y.o. male with h/o sleep apnea, palpitations seen today for routine electrophysiology followup.   Since last being seen in our clinic the patient reports doing ***.  he denies chest pain, palpitations, dyspnea, PND, orthopnea, nausea, vomiting, dizziness, syncope, edema, weight gain, or early satiety.   Review of systems complete and found to be negative unless listed in HPI.   EP Information / Studies Reviewed:    {EKGtoday:28818}        Risk Assessment/Calculations:            Physical Exam:   VS:  There were no vitals taken for this visit.   Wt Readings from Last 3 Encounters:  01/10/24 278 lb (126.1 kg)  12/30/23 285 lb (129.3 kg)  12/27/23 282 lb (127.9 kg)     GEN: Well nourished, well developed in no acute distress NECK: No JVD; No carotid bruits CARDIAC: {EPRHYTHM:28826}, no murmurs, rubs, gallops RESPIRATORY:  Clear to auscultation without rales, wheezing or rhonchi  ABDOMEN: Soft, non-tender, non-distended EXTREMITIES:  No edema; No deformity   ASSESSMENT AND PLAN:    1.  Palpitations:***  2.  Obstructive sleep apnea: CPAP compliance encouraged  Follow up with {WUJWJ:19147} {EPFOLLOW WG:95621}  Signed, Gibson Telleria Cortland Ding, MD

## 2024-01-23 ENCOUNTER — Encounter: Payer: Self-pay | Admitting: Cardiology

## 2024-01-23 ENCOUNTER — Ambulatory Visit: Attending: Cardiology | Admitting: Cardiology

## 2024-01-23 VITALS — BP 130/90 | HR 61 | Ht 76.0 in | Wt 273.0 lb

## 2024-01-23 DIAGNOSIS — R002 Palpitations: Secondary | ICD-10-CM | POA: Diagnosis not present

## 2024-01-23 NOTE — Telephone Encounter (Signed)
 Pt's spouse calling to schedule in pulm. See note below.     Agent advised clinic is reviewing and turnaround time is 5 business days.     CT scan on file under imaging.    FYI    Pt's spouse Call back-[phone](leave detailed VM)

## 2024-01-23 NOTE — Telephone Encounter (Signed)
 Patient is now scheduled 5/15 with Dr Thresa Floor.

## 2024-01-23 NOTE — Telephone Encounter (Signed)
 [facility] RN Note:     NEW- STAT- Schedule in E[facility].     Camilo Cella, BSN, RN  [facility]  P: [phone]

## 2024-01-24 ENCOUNTER — Ambulatory Visit: Admitting: Bariatrics

## 2024-01-24 ENCOUNTER — Encounter: Payer: Self-pay | Admitting: Bariatrics

## 2024-01-24 VITALS — BP 131/83 | HR 66 | Temp 97.5°F | Ht 76.0 in | Wt 273.0 lb

## 2024-01-24 DIAGNOSIS — E785 Hyperlipidemia, unspecified: Secondary | ICD-10-CM

## 2024-01-24 DIAGNOSIS — E669 Obesity, unspecified: Secondary | ICD-10-CM | POA: Diagnosis not present

## 2024-01-24 DIAGNOSIS — E66811 Obesity, class 1: Secondary | ICD-10-CM

## 2024-01-24 DIAGNOSIS — Z6833 Body mass index (BMI) 33.0-33.9, adult: Secondary | ICD-10-CM | POA: Diagnosis not present

## 2024-01-24 NOTE — Progress Notes (Signed)
 WEIGHT SUMMARY AND BIOMETRICS  Weight Lost Since Last Visit: 5lb  Weight Gained Since Last Visit: 0   Vitals Temp: (!) 97.5 F (36.4 C) BP: 131/83 Pulse Rate: 66 SpO2: 98 %   Anthropometric Measurements Height: 6\' 4"  (1.93 m) Weight: 273 lb (123.8 kg) BMI (Calculated): 33.24 Weight at Last Visit: 278lb Weight Lost Since Last Visit: 5lb Weight Gained Since Last Visit: 0 Starting Weight: 282lb Total Weight Loss (lbs): 9 lb (4.082 kg) Peak Weight: 292lb   Body Composition  Body Fat %: 28.7 % Fat Mass (lbs): 78.4 lbs Muscle Mass (lbs): 185.2 lbs Total Body Water (lbs): 134.2 lbs Visceral Fat Rating : 16   Other Clinical Data Fasting: no Labs: no Today's Visit #: 3 Starting Date: 12/27/23    OBESITY Miles is here to discuss his progress with his obesity treatment plan along with follow-up of his obesity related diagnoses.    Nutrition Plan: the Category 4 plan - 80% adherence.  Current exercise: no regular exercise  Interim History:  He is down 5 lbs since his last visit.  Eating all of the food on the plan., Protein intake is as prescribed, Water intake is adequate., Denies polyphagia, and Denies excessive cravings.  Hunger is moderately controlled.  Cravings are moderately controlled.  Assessment/Plan:   Hyperlipidemia LDL is not at goal. Medication(s): Omega-3-Fatty Acids.  Cardiovascular risk factors: dyslipidemia, male gender, and obesity (BMI >= 30 kg/m2)  Lab Results  Component Value Date   CHOL 192 12/27/2023   HDL 28 (L) 12/27/2023   LDLCALC 131 (H) 12/27/2023   LDLDIRECT 123.0 12/28/2022   TRIG 185 (H) 12/27/2023   CHOLHDL 6 12/28/2022   Lab Results  Component Value Date   ALT 30 12/27/2023   AST 21 12/27/2023   ALKPHOS 52 12/27/2023   BILITOT 1.2 12/27/2023   The 10-year ASCVD risk score (Arnett DK, et al., 2019) is:  13.2%   Values used to calculate the score:     Age: 89 years     Sex: Male     Is Non-Hispanic African American: No     Diabetic: No     Tobacco smoker: No     Systolic Blood Pressure: 131 mmHg     Is BP treated: Yes     HDL Cholesterol: 28 mg/dL     Total Cholesterol: 192 mg/dL  Plan:  Continue statin.  Will adhere to the plan at least 85 to 95%. Will consider yoga for flexibility, mind-body connection and exercise overall.  Will avoid all trans fats.  Will read labels Will minimize saturated fats except the following: low fat meats in moderation, diary, and limited dark chocolate.  Increase Omega 3 in foods, and consider an Omega 3 supplement.    Generalized Obesity: Current BMI BMI (Calculated): 33.24    Aamari is currently in the action stage of change. As  such, his goal is to continue with weight loss efforts.  He has agreed to the Category 4 plan.  Exercise goals: All adults should avoid inactivity. Some physical activity is better than none, and adults who participate in any amount of physical activity gain some health benefits.  Behavioral modification strategies: increasing lean protein intake, no meal skipping, meal planning , planning for success, increasing vegetables, and keep healthy foods in the home.  Lillian has agreed to follow-up with our clinic in 2 weeks.       Objective:   VITALS: Per patient if applicable, see vitals. GENERAL: Alert and in no acute distress. CARDIOPULMONARY: No increased WOB. Speaking in clear sentences.  PSYCH: Pleasant and cooperative. Speech normal rate and rhythm. Affect is appropriate. Insight and judgement are appropriate. Attention is focused, linear, and appropriate.  NEURO: Oriented as arrived to appointment on time with no prompting.   Attestation Statements:   This was prepared with the assistance of Engineer, civil (consulting).  Occasional wrong-word or sound-a-like substitutions may have occurred due to the inherent  limitations of voice recognition.   Kirk Peper, DO

## 2024-02-06 ENCOUNTER — Other Ambulatory Visit (INDEPENDENT_AMBULATORY_CARE_PROVIDER_SITE_OTHER): Payer: Self-pay | Admitting: Family

## 2024-02-06 DIAGNOSIS — R0602 Shortness of breath: Secondary | ICD-10-CM

## 2024-02-09 ENCOUNTER — Telehealth (INDEPENDENT_AMBULATORY_CARE_PROVIDER_SITE_OTHER): Admitting: Pulmonary Medicine

## 2024-02-09 DIAGNOSIS — R911 Solitary pulmonary nodule: Secondary | ICD-10-CM

## 2024-02-09 DIAGNOSIS — G4733 Obstructive sleep apnea (adult) (pediatric): Secondary | ICD-10-CM

## 2024-02-09 DIAGNOSIS — R590 Localized enlarged lymph nodes: Secondary | ICD-10-CM

## 2024-02-09 NOTE — Progress Notes (Signed)
 ---------------------(data below generated by Penne Debby Finders, MD)--------------------     Patient Verification & Telemedicine Consent & Financial Waiver:    1.   Identity: I have verified this patient's identity to be accurate.  2.   Consent: I verify consent has been secured in one of the following methods: (a) obtained written/ online attestation consent (via MyChartVideoVisit pathway), (b) the spoke-side provider has obtained verbal or written consent from patient/surrogate (if this is a provider to provider evaluation), or (c) in all other cases, I have personally obtained verbal consent from the patient/ surrogate (noting all elements below) to perform this voluntary telemedicine evaluation (including obtaining history, performing examination and reviewing data provided by the patient).   The patient/ surrogate has the right to refuse this evaluation.  I have explained risks (including potential loss of confidentiality), benefits, alternatives, and the potential need for subsequent face to face care. Patient/ surrogate understands that there is a risk of medical inaccuracies given that our recommendations will be made based on reported data (and we must therefore assume this information is accurate).  Knowing that there is a risk that this information is not reported accurately, and that the telemedicine video, audio, or data feed may be incomplete, the patient agrees to proceed with evaluation and holds us  harmless knowing these risks.  3.   Healthcare Team: The patient/ surrogate has been notified that other healthcare professionals (including students, residents and Engineer, maintenance) may be involved in this audio-video evaluation.   All laws concerning confidentiality and patient access to medical records and copies of medical records apply to telemedicine.  4.   Privacy: If this is a Restaurant manager, fast food Visit, the patient/ surrogate has received the [place] Notice of Privacy Practices via E-Checkin  process.  For all other video visit techniques, I have verbally provided the patient/ surrogate with the  web link in [place] (https://health.[email or web address].aspx) or Spanish (https://health.[email or web address].aspx).  The patient/ surrogate acknowledges both being provided the NPP link, and has been offered to have the NPP mailed to the patient/ surrogate by [place]  mail.  The patient/ surrogate has voiced understanding an acknowledgement of receipt of this NPP web address.  If the patient/surrogate has elected to receive the NPP via [place]  mail, I verify that the NPP will be sent promptly to the patient/surrogate via [place]  mail.  5.   Capacity: I have reviewed this above verification and consent paragraph with the patient/ surrogate and the patient is capacitated or has a surrogate. If the patient is not capacitated to understand the above, and no surrogate is available, since this is not an emergency evaluation, the visit will be rescheduled until such time that the patient can consent, or the surrogate is available to consent. If this is an emergency evaluation and the patient is not capacitated to understand the above, and no surrogate is available, I am proceeding with this evaluation as this is felt to be an emergency setting and no appropriate specialist is available at the bedside to perform these evaluations.  6.   Financial Waiver: If this is a Restaurant manager, fast food Visit, the patient has been made aware of the financial waiver via E-Checkin process.  For all other video visit techniques, an E-Checkin process is not performed.  As such, I have personally verbally informed the patient/ surrogate that this evaluation will be a billable encounter similar to an in-person clinic visit, and the patient/ surrogate has agreed to pay the fee for services rendered.  If we are billing insurance for the  patient's telehealth visit, his out-of-pocket cost will be determined based on his plan and will be billed to  him.    7.   Intra-State Location: The patient/ surrogate attests to understanding that if the patient accesses these services from a location outside of [place] , that the patient does so at the patient's own risk and initiative and that the patient is ultimately responsible for compliance with any laws or regulations associated with the patient's use.  8.   Specific Use:The patient/ surrogate understands that [facility] makes no representation that materials or servicesdelivered via telecommunication services, or listed on telemedicine websites, are appropriate or available for use in any other location.           Demographics:  Medical Record #: [id number]  Date: Feb 09, 2024  Patient Name: Henry Norman  DOB: 04/12/1966  Age: 58 year old  Sex: male  Location: Home address on file  Patient seen Status: Patient was evaluated via telemedicine (audio or audio/video)     Evaluator(s):  Henry Norman was evaluated by me today.    Clinic Location: [facility]  [address]  [place] [id number]     PULMONARY CLINIC    REFERRED BY: Elvin Sober, NP  [phone] Single [address]  [address]  [place],  [place] [address]    CHIEF COMPLAINT  Sob and weight loss    HISTORY OF PRESENT ILLNESS/INTERVAL EVENTS  Henry Norman is a 58 year old male is here for 2 months of rapid weight loss and hilar adenopathy on CT. Says he has had increased appetite and lost about 16lb. Has never had a colonoscopy. 20py smoking hx, quit 2007. Has untreated OSA, severity unknown. Wife has been checking his O2 sats at night and notes that he dips into the 80s. No constitutional symptoms, but does endorse constipation as well as post-nasal gtt. 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 Regarding these symptoms, he recently completed PFTs in [place] and is awaiting results  (will upload). CT chest completed with hilar adenopathy, 4mm LUL nodule, and emphysematous changes, but we are still awaiting raw data. Reviewed diagnostic considerations and encouraged imaging upload and urgent referral for EBUS.     ROS:   A full review of systems was performed and the pertinent positives and negatives were mentioned in the HPI, all other review of systems were negative.     PMH/PSH  Past Medical History:   Diagnosis Date    Preoperative clearance 08/29/2017    Screening for colon cancer 11/05/2020    Screening for endocrine, metabolic and immunity disorder 11/05/2020     Past Surgical History:   Procedure Laterality Date    KNEE SURGERY Left 09/27/2004    Meniscus Tear    KNEE SURGERY Left 09/27/1986    Torn ACL    SHOULDER SURGERY Left     labrum tear     Patient Active Problem List    Diagnosis Date Noted    Former smoker 01/11/2024    Shortness of breath 01/11/2024    Rapid weight loss 01/11/2024    Hyperlipidemia, unspecified hyperlipidemia type 01/11/2024    Encounter for screening prostate specific antigen (PSA) measurement 01/11/2024    Screening for endocrine, nutritional, metabolic and immunity disorder 03/21/2023    Chronic pain of left knee 03/21/2023  Need for vaccination 03/21/2023    Dermatitis 11/05/2020    Screening for colon cancer 11/05/2020    Shoulder pain, unspecified chronicity, unspecified laterality 08/29/2017    Encounter for routine adult health examination without abnormal findings 07/04/2017    Cervical radiculitis 07/17/2010       FH  Family History   Family history unknown: Yes       SH  none     Socioeconomic History    Marital status: Married   Tobacco Use    Smoking status: Former     Current packs/day: 0.00     Types: Cigarettes     Quit date: 2006     Years since quitting: 19.3    Smokeless tobacco: Former   Substance and Sexual Activity    Alcohol use: No     Social History     Social History Narrative    Not on file       ALLERGIES/MEDICATIONS  Allergies    Allergen Reactions    Fluoxetine Palpitations    Paroxetine Palpitations        budesonide -formoterol  inhale 2 puffs by mouth every 12 hours 10.2 g 1    hydrOXYzine  HCL Take 1 tablet (25 mg) by mouth 2 times daily.      mirtazapine  Take 1 tablet (15 mg) by mouth nightly.  0       VITALS  There were no vitals taken for this visit.     PHYSICAL EXAM  General: well appearing in nad.  Head/eyes/ears: normocephalic/atraumatic, perrl/eomi  Oropharynx/nares: without any lesions, nares patent  Heart:  No obvious JVD or peripheral edema  Lungs: normal wob. Normal I:E ratio  Extremities:  no cyanosis, clubbing, or edema.  Skin:  No rashes or lesions.  Psych: normal affect and mood     LABS  Lab Results   Component Value Date    WBC 6.8 01/11/2024    RBC 5.81 (H) 01/11/2024    HGB 17.2 (H) 01/11/2024    HCT 51.8 (H) 01/11/2024    MCV 89.2 01/11/2024    MCHC 33.2 01/11/2024    RDW 12.2 01/11/2024    PLT 261 01/11/2024    MPV 9.1 01/11/2024    LYMPHS 29.9 01/11/2024    MONOS 7.2 01/11/2024    EOS 1.6 01/11/2024    BASOS 0.4 01/11/2024     Lab Results   Component Value Date    NA 138 01/11/2024    K 4.9 01/11/2024    CL 100 01/11/2024    BICARB 33 (H) 01/11/2024    BUN 17 01/11/2024    CREAT 1.03 01/11/2024    GLU 89 01/11/2024    CA 10.0 01/11/2024     Lab Results   Component Value Date    AST 17 01/11/2024    ALT 15 01/11/2024    ALK 59 01/11/2024    TP 7.1 01/11/2024    ALB 4.8 01/11/2024    TBILI 0.6 01/11/2024      No results found for: PT, PTT, INR  No results found for: BNP, BNPP    Outside CT report 01/12/24 - raw imaging not yet uploaded   EXAM: CT CHEST AND ABDOMEN WITHOUT AND WITH CONTRAST     HISTORY: Abnormal weight loss     TECHNIQUE: Contiguous axial images of the chest, abdomen were obtained utilizing a multislice, multidetector CT scanner with contrast. Post-processed reformations were also submitted for review. The images were obtained following the administration of IV  contrast. Oral contrast  provided.     Contrast: 90 cc Omnipaque 350 IV contrast.     The total DLP was 2868.7 mGy-cm and the CTDI was 16.84 mGy. Low dose protocols were performed.     One or more of the following dose reduction techniques were used: automated exposure control, adjustment of the mA and/or kV according to patient size, use of iterative reconstruction technique. A total of 0 CT (Computed Tomography) examinations and 0   myocardial perfusion studies have been performed on this patient over the past 12 months. Counts as indicated include examinations performed within our network.     COMPARISON: Chest radiograph same day     FINDINGS:     CHEST     Vasculature: Normal size thoracic aorta.     Lymph Nodes: 1.4 cm x 2 cm right hilar lymph node short axis (3-52).     1.4 cm x 1.4 cm right infrahilar lymph node (3-64).     1.4 cm x 1.1 cm left infrahilar lymph node (3-58).     Mediastinum: Heart is normal in size. No pericardial effusion. Minimal soft tissue haziness in the anterior mediastinum. Attention on follow-up.     Atherosclerotic calcification of the coronary arteries.     Atherosclerotic calcification of the aorta.     Lungs/airway: No focal consolidation, pneumothorax, nor large pleural effusion.     4 mm pulmonary nodule left lung apex (4-22).     Dependent compressive atelectasis.     Emphysema, most pronounced in the upper lobes.     Trachea and mainstem bronchi are patent.     Musculoskeletal: No acute osseous abnormality.     ABDOMEN AND PELVIS     Liver: 1.2 cm hypoattenuated lesion left hepatic lobe located centrally, resembles a cyst. Small size limits assessment.     Gallbladder/biliary tree: Unremarkable.     Spleen: Unremarkable.     Pancreas: Unremarkable.      Adrenal Glands: Unremarkable.     Kidneys: No hydronephrosis.  No discrete calyceal stone. Symmetric enhancement of the parenchyma. Urinary bladder grossly unremarkable for degree of distention.     Bowel: No evidence of bowel obstruction. No acute  bowel inflammation. No free air. No significant free fluid.  Colonic diverticula. Partially visualized appendix appear unremarkable.     Lymphadenopathy: No bulky lymphadenopathy.     Abdominal Wall and Mesentery: Small umbilical fat filled hernia.     Vasculature: Aorta normal in caliber. Atherosclerosis.     Musculoskeletal: No acute osseous abnormality.     IMPRESSION:     1. Enlarged hilar and infrahilar lymph nodes. Correlate with patient's symptomatology and laboratory workup. Attention on follow-up.     2.  Minimal soft tissue haziness in the anterior mediastinum. Attention on follow-up.     3.  No suspicious pulmonary nodule.     4.  Emphysema.     5.  Probable small cyst in the liver. Attention on follow-up.     6.  Additional findings above.           All labs and imaging data reviewed and interpreted by me.    ASSESSMENT AND PLAN:   This is a 57yo former smoker presenting with rapid weight loss and hilar adenopathy on CT.     Rapid weight loss  Emphysema  Hilar adenopathy   4mm L apical lung nodule    -please upload raw data for outside CT  -please upload outside PFT  -HST  -stat  referral to IP for EBUS      # Health Maintenance  Annual vaccines encouraged    CC Elvin Sober, NP  [phone] Single [address]  [address]  [place],  [place] [address]    I personally spent total 60 minutes in face-to-face and non-face-to-face activities  related to the patient's visit today, excluding any separately reportable  services/procedures        Penne Finders, MD  Division of Pulmonary, Critical Care, Sleep Medicine and Physiology  [facility]

## 2024-02-10 ENCOUNTER — Telehealth (INDEPENDENT_AMBULATORY_CARE_PROVIDER_SITE_OTHER): Payer: Self-pay | Admitting: Pulmonary Medicine

## 2024-02-10 NOTE — Telephone Encounter (Signed)
 Pt called in to inform clinic that he will be coming in on Monday to drop off his chest images. Sending as an Financial planner, thank you.

## 2024-02-13 ENCOUNTER — Telehealth (HOSPITAL_BASED_OUTPATIENT_CLINIC_OR_DEPARTMENT_OTHER): Payer: Self-pay | Admitting: Pulmonary Medicine

## 2024-02-13 NOTE — Patient Instructions (Signed)
 Rapid weight loss  Emphysema  Hilar adenopathy   4mm L apical lung nodule     -please upload raw data for outside CT  -please upload outside PFT  -HST  -stat referral to IP for EBUS

## 2024-02-13 NOTE — Telephone Encounter (Signed)
 S/w patient today and he states he dropped CD of images from [facility] at [address] Clint Ree at pulmonary today.

## 2024-02-14 ENCOUNTER — Encounter (INDEPENDENT_AMBULATORY_CARE_PROVIDER_SITE_OTHER): Payer: Self-pay | Admitting: Pulmonary Medicine

## 2024-02-15 ENCOUNTER — Other Ambulatory Visit: Payer: Self-pay

## 2024-02-15 NOTE — Telephone Encounter (Signed)
 Received CD with imaging and reports. All have been uploaded. Patient informed via Production manager.    Imaging to be mailed to patient to retain for his records.     MD has been informed.

## 2024-02-16 ENCOUNTER — Encounter: Payer: Self-pay | Admitting: Bariatrics

## 2024-02-16 ENCOUNTER — Ambulatory Visit: Admitting: Bariatrics

## 2024-02-16 VITALS — BP 136/86 | HR 70 | Temp 97.5°F | Ht 76.0 in | Wt 268.0 lb

## 2024-02-16 DIAGNOSIS — E669 Obesity, unspecified: Secondary | ICD-10-CM | POA: Diagnosis not present

## 2024-02-16 DIAGNOSIS — E66811 Obesity, class 1: Secondary | ICD-10-CM

## 2024-02-16 DIAGNOSIS — E785 Hyperlipidemia, unspecified: Secondary | ICD-10-CM | POA: Diagnosis not present

## 2024-02-16 DIAGNOSIS — Z6832 Body mass index (BMI) 32.0-32.9, adult: Secondary | ICD-10-CM | POA: Diagnosis not present

## 2024-02-16 NOTE — Progress Notes (Unsigned)
 WEIGHT SUMMARY AND BIOMETRICS  Weight Lost Since Last Visit: 5lb  Weight Gained Since Last Visit: 0   Vitals Temp: (!) 97.5 F (36.4 C) BP: 136/86 Pulse Rate: 70 SpO2: 94 %   Anthropometric Measurements Height: 6\' 4"  (1.93 m) Weight: 268 lb (121.6 kg) BMI (Calculated): 32.64 Weight at Last Visit: 273lb Weight Lost Since Last Visit: 5lb Weight Gained Since Last Visit: 0 Starting Weight: 282lb Total Weight Loss (lbs): 14 lb (6.35 kg) Peak Weight: 292lb   Body Composition  Body Fat %: 27.9 % Fat Mass (lbs): 74.8 lbs Muscle Mass (lbs): 184.2 lbs Total Body Water (lbs): 135.2 lbs Visceral Fat Rating : 16   Other Clinical Data Fasting: no Labs: no Today's Visit #: 4 Starting Date: 12/27/23    OBESITY Dylan Hatfield is here to discuss his progress with his obesity treatment plan along with follow-up of his obesity related diagnoses.    Nutrition Plan: the Category 4 plan - 75% adherence.  Current exercise: no regular exercise  Interim History:  He is down 5 lbs since his last visit.  Eating all of the food on the plan., Protein intake is as prescribed, Is not skipping meals, and Water intake is adequate.  Hunger is moderately controlled.  Cravings are moderately controlled. He is having minimal cravings in the evening.  Assessment/Plan:   Dyslipidemia:  LDL is not at goal. Medication(s): Omega 3 fatty acids.  Cardiovascular risk factors: advanced age (older than 63 for men, 19 for women), dyslipidemia, male gender, obesity (BMI >= 30 kg/m2), and sedentary lifestyle  Lab Results  Component Value Date   CHOL 192 12/27/2023   HDL 28 (L) 12/27/2023   LDLCALC 131 (H) 12/27/2023   LDLDIRECT 123.0 12/28/2022   TRIG 185 (H) 12/27/2023   CHOLHDL 6 12/28/2022   Lab Results  Component Value Date   ALT 30 12/27/2023   AST 21 12/27/2023   ALKPHOS 52  12/27/2023   BILITOT 1.2 12/27/2023   The 10-year ASCVD risk score (Arnett DK, et al., 2019) is: 14.1%   Values used to calculate the score:     Age: 58 years     Sex: Male     Is Non-Hispanic African American: No     Diabetic: No     Tobacco smoker: No     Systolic Blood Pressure: 136 mmHg     Is BP treated: Yes     HDL Cholesterol: 28 mg/dL     Total Cholesterol: 192 mg/dL  Plan:  Will avoid all trans fats.  Will read labels Will minimize saturated fats except the following: low fat meats in moderation, diary, and limited dark chocolate.  Increase Omega 3 in foods, and his Omega 3 supplement.      Generalized Obesity: Current BMI BMI (Calculated): 32.64    Dylan Hatfield is currently in the action stage of change. As such,  his goal is to continue with weight loss efforts.  He has agreed to the Category 4 plan.  Exercise goals: All adults should avoid inactivity. Some physical activity is better than none, and adults who participate in any amount of physical activity gain some health benefits.  Behavioral modification strategies: increasing lean protein intake, no meal skipping, meal planning , increase water intake, increasing lower sugar fruits, decrease snacking , and mindful eating.  Dylan Hatfield has agreed to follow-up with our clinic in 4 weeks.    Objective:   VITALS: Per patient if applicable, see vitals. GENERAL: Alert and in no acute distress. CARDIOPULMONARY: No increased WOB. Speaking in clear sentences.  PSYCH: Pleasant and cooperative. Speech normal rate and rhythm. Affect is appropriate. Insight and judgement are appropriate. Attention is focused, linear, and appropriate.  NEURO: Oriented as arrived to appointment on time with no prompting.   Attestation Statements:   This was prepared with the assistance of Engineer, civil (consulting).  Occasional wrong-word or sound-a-like substitutions may have occurred due to the inherent limitations of voice recognition   Kirk Peper, DO

## 2024-02-17 ENCOUNTER — Encounter (INDEPENDENT_AMBULATORY_CARE_PROVIDER_SITE_OTHER): Payer: Self-pay | Admitting: Pulmonary Medicine

## 2024-02-26 NOTE — Progress Notes (Signed)
 REFERRING PHYSICIAN   Referring Provider:  Elvin Sober, NP  [address]  [address]  [place],  [place] [address]    Primary Care Provider:  Valera Starring  [address]  [facility] [place] [address]    PATIENT IDENTIFICATION   ROMULO OKRAY is a 58 year old male with hilar lymphadenopathy seen in consultation at the request of Dr. Elvin.    CHIEF COMPLAINT / HISTORY OF PRESENT ILLNESS   HANDY MCLOUD is a 58 year old male hx HLD, prior tobacco (20PY, quit 2007), OSA not on CPAP use who is evaluated for mLAD.     Met with Dr. Chapman from general pulmonary on 02/09/24 and noted rapid weight loss despite good appetite. Chest imaging findings of hilar adenopathy were noted and he was referred for urgent EBUS.     Patient presents today reporting a 20lb weight loss over a two month period, though has regained 6lbs at this point. Upon further reflection, he does admit to some healthier eating and increased physical activity around the time the weight loss started.  Endorses throat clearing and occasional sputum expectoration which is clear.   Reports mildly increased DOE with exertion.   Endorses right sided chest pain which is not exertional, but he thinks is positional and disappears immediately after onset.  Denies f/c, does endorse single episode of night sweats.     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      Family Hx:   - lung ca: unknown (adopted)   - lung disease: unknown (adopted)       Evaluation to date has also included CT Chest             INTERVENTIONAL PULMONOLOGY PERTINENT HISTORY     Social History     Tobacco Use   Smoking Status Former    Current packs/day: 0.00    Types: Cigarettes    Quit date: 2006    Years since quitting: 19.4   Smokeless Tobacco Former        IP PROCEDURES:  None     PAST  HISTORY     Probelms   Patient Active Problem List    Diagnosis Date Noted    Former smoker 01/11/2024    Shortness of breath 01/11/2024    Rapid weight loss 01/11/2024    Hyperlipidemia, unspecified hyperlipidemia type 01/11/2024    Encounter for screening prostate specific antigen (PSA) measurement 01/11/2024    Screening for endocrine, nutritional, metabolic and immunity disorder 03/21/2023    Chronic pain of left knee 03/21/2023    Need for vaccination 03/21/2023    Dermatitis 11/05/2020    Screening for colon cancer 11/05/2020    Shoulder pain, unspecified chronicity, unspecified laterality 08/29/2017    Encounter for routine adult health examination without abnormal findings 07/04/2017    Cervical radiculitis 07/17/2010          Past Medical History   Past Medical History:   Diagnosis Date    Preoperative clearance 08/29/2017    Screening for colon cancer 11/05/2020    Screening for endocrine, metabolic and immunity disorder 11/05/2020         Past Surgical History   Past Surgical History:   Procedure Laterality Date  KNEE SURGERY Left 09/27/2004    Meniscus Tear    KNEE SURGERY Left 09/27/1986    Torn ACL    SHOULDER SURGERY Left     labrum tear         Family History   Family History   Family history unknown: Yes         Social History   Social History     Tobacco Use    Smoking status: Former     Current packs/day: 0.00     Types: Cigarettes     Quit date: 2006     Years since quitting: 19.4    Smokeless tobacco: Former   Substance Use Topics    Alcohol use: No     Social History     Social History Narrative    Not on file        ALLERGIES     Allergies   Allergen Reactions    Fluoxetine Palpitations    Paroxetine Palpitations       HOME MEDICATIONS     Prior to Admission medications    Medication Sig Start Date End Date Taking? Authorizing Provider   budesonide -formoterol  (SYMBICORT ) 80-4.5 MCG/ACT inhaler inhale 2 puffs by mouth every 12 hours 02/07/24   Elvin Sober, NP   hydrOXYzine  HCL (ATARAX ) 25  MG tablet Take 1 tablet (25 mg) by mouth 2 times daily.    Historical, Documentation   mirtazapine  (REMERON ) 15 MG tablet Take 1 tablet (15 mg) by mouth nightly. 07/02/17   Historical, Documentation          REVIEW OF SYSTEMS   ROS neg except as per HPI     PHYSICAL EXAMINATION   BP 155/83 (BP Location: Left arm, BP Patient Position: Sitting, BP cuff size: Regular)   Pulse 67   Temp 98 F (36.7 C) (Temporal)   Resp 15   Ht 5' 11 (1.803 m)   Wt 96.2 kg (212 lb)   SpO2 98%   BMI 29.57 kg/m    Oxygen Therapy  SpO2: 98 %          Physical Exam  Gen: NAD, conversant  HEENT: PERRL, EOMI, MMM, OP clear  Neck: supple, full ROM  CV: RRR, no m/r/g  Pulm: CTAB, no w/r/c   Abd: soft, ND, NT  Ext: WWP, no LEE   Neuro: AAOx4, no focal deficits        LABS/STUDIES   All studies were reviewed personally     CBC:  BMP:   Lab Results   Component Value Date    WBC 6.8 01/11/2024    WBC 5.6 04/29/2023    WBC 5.3 07/09/2017    HGB 17.2 (H) 01/11/2024    HGB 17.5 (H) 04/29/2023    HGB 17.5 (H) 07/09/2017    HCT 51.8 (H) 01/11/2024    HCT 50.8 (H) 04/29/2023    HCT 50.4 (H) 07/09/2017    PLT 261 01/11/2024    PLT 221 04/29/2023    PLT 218 07/09/2017    SEG 60.9 01/11/2024    SEG 63.4 04/29/2023    SEG 60.5 07/09/2017    LYMPHS 29.9 01/11/2024    LYMPHS 28.5 04/29/2023    LYMPHS 29.8 07/09/2017    MONOS 7.2 01/11/2024    MONOS 6.0 04/29/2023    MONOS 6.7 07/09/2017     Lab Results   Component Value Date    NA 138 01/11/2024    NA 137 04/29/2023    NA 139 07/09/2017  K 4.9 01/11/2024    K 4.5 04/29/2023    K 5.4 (H) 07/09/2017    CL 100 01/11/2024    CL 102 04/29/2023    CL 103 07/09/2017    BICARB 33 (H) 01/11/2024    BICARB 27 04/29/2023    BICARB 28 07/09/2017    BUN 17 01/11/2024    BUN 12 04/29/2023    BUN 12 07/09/2017    CREAT 1.03 01/11/2024    CREAT 0.92 04/29/2023    CREAT 0.94 07/09/2017    GLU 89 01/11/2024    GLU 84 04/29/2023    GLU 93 07/09/2017    CA 10.0 01/11/2024    CA 9.5 04/29/2023    CA 9.7 07/09/2017         Coags:  LFTs:   No results found for: PTT, INR, PT   Lab Results   Component Value Date    ALK 59 01/11/2024    ALK 55 04/29/2023    ALK 56 07/09/2017    AST 17 01/11/2024    AST 20 04/29/2023    AST 18 07/09/2017    ALT 15 01/11/2024    ALT 22 04/29/2023    ALT 17 07/09/2017    TBILI 0.6 01/11/2024    TBILI 0.7 04/29/2023    TBILI 0.7 07/09/2017    TP 7.1 01/11/2024    TP 6.8 04/29/2023    TP 7.3 07/09/2017    ALB 4.8 01/11/2024    ALB 4.5 04/29/2023    ALB 4.5 07/09/2017        ABG   No results found for: ARTPH, ARTPCO2, ARTPO2, ARTHC03, ARTBE, ARTO2SAT        Microbiology:    No results found for: BLOODCULT, FUNGALBC, AFBBACTCULT, URINECULTURE, QTFERON, QUANTIFERON, CRYPTOAG, CRYPTOCAGCSF, COCCICF, COCCICFCSF, COCCIIMMDIIF, COCCIIMMCSF, HISTOAGUR, CMVDNAQT, CMVDNAQTCSF    No results found for this or any previous visit.    Imaging:   Last Chest X-ray Results (up to 3)    No resulted procedures found.       Last CT Chest Results (up to 3)    No resulted procedures found.       Last PET Result    No resulted procedures found.         CT Chest 01/12/24:         Outside CT report 01/12/24 - raw imaging not yet uploaded   EXAM: CT CHEST AND ABDOMEN WITHOUT AND WITH CONTRAST     HISTORY: Abnormal weight loss     TECHNIQUE: Contiguous axial images of the chest, abdomen were obtained utilizing a multislice, multidetector CT scanner with contrast. Post-processed reformations were also submitted for review. The images were obtained following the administration of IV    contrast. Oral contrast provided.     Contrast: 90 cc Omnipaque 350 IV contrast.     The total DLP was 2868.7 mGy-cm and the CTDI was 16.84 mGy. Low dose protocols were performed.     One or more of the following dose reduction techniques were used: automated exposure control, adjustment of the mA and/or kV according to patient size, use of iterative reconstruction technique. A total of 0 CT (Computed  Tomography) examinations and 0   myocardial perfusion studies have been performed on this patient over the past 12 months. Counts as indicated include examinations performed within our network.     COMPARISON: Chest radiograph same day     FINDINGS:     CHEST     Vasculature: Normal size  thoracic aorta.     Lymph Nodes: 1.4 cm x 2 cm right hilar lymph node short axis (3-52).     1.4 cm x 1.4 cm right infrahilar lymph node (3-64).     1.4 cm x 1.1 cm left infrahilar lymph node (3-58).     Mediastinum: Heart is normal in size. No pericardial effusion. Minimal soft tissue haziness in the anterior mediastinum. Attention on follow-up.     Atherosclerotic calcification of the coronary arteries.     Atherosclerotic calcification of the aorta.     Lungs/airway: No focal consolidation, pneumothorax, nor large pleural effusion.     4 mm pulmonary nodule left lung apex (4-22).     Dependent compressive atelectasis.     Emphysema, most pronounced in the upper lobes.     Trachea and mainstem bronchi are patent.     Musculoskeletal: No acute osseous abnormality.     ABDOMEN AND PELVIS     Liver: 1.2 cm hypoattenuated lesion left hepatic lobe located centrally, resembles a cyst. Small size limits assessment.     Gallbladder/biliary tree: Unremarkable.     Spleen: Unremarkable.     Pancreas: Unremarkable.      Adrenal Glands: Unremarkable.     Kidneys: No hydronephrosis.  No discrete calyceal stone. Symmetric enhancement of the parenchyma. Urinary bladder grossly unremarkable for degree of distention.     Bowel: No evidence of bowel obstruction. No acute bowel inflammation. No free air. No significant free fluid.  Colonic diverticula. Partially visualized appendix appear unremarkable.     Lymphadenopathy: No bulky lymphadenopathy.     Abdominal Wall and Mesentery: Small umbilical fat filled hernia.     Vasculature: Aorta normal in caliber. Atherosclerosis.     Musculoskeletal: No acute osseous abnormality.     IMPRESSION:     1.  Enlarged hilar and infrahilar lymph nodes. Correlate with patient's symptomatology and laboratory workup. Attention on follow-up.     2.  Minimal soft tissue haziness in the anterior mediastinum. Attention on follow-up.     3.  No suspicious pulmonary nodule.     4.  Emphysema.     5.  Probable small cyst in the liver. Attention on follow-up.     6.  Additional findings above.          Echo:      PFTs:         No data to display                     IMPRESSION   KAMARIUS BUCKBEE is a 58 year old male with a history of HLD, prior tobacco (20PY, quit 2007), OSA not on CPAP use who is evaluated for mLAD.     Patient largely denies respiratory or systemic symptoms but has endorsed a 20lb weight loss. He admits to lifestyle changes around the time of this but the degree of weight loss seems out of proportion. As part of his work up he had CT Chest which shows hilar LAD without other obvious abnormalities. We discussed bronchoscopy with biopsy of the lymph nodes to r/o more concerning etiologies which could be tied to his weight loss. I have recommended EBUS TBNA with possible cryobiopsy of lymph nodes as atypical diagnoses such as sarcoidosis or lymphoma could be possible. Patient is in agreement, consent signed in clinic.     PLAN   - bronchoscopy with EBUS TBNA +/- TBCBx of lymph nodes    - return to IP clinic  after above for results     Care Coordination:   Anticoagulation: None    GLP - 1 Medications Review:  Ozempic/Wegovy/Saxenda (semaglutide)  no   Rybelsus (semaglutide tablets)  no   Victoza (liraglutide)  no   Trulicity (dulaglutide)   no   Byetta (exenatide)  no   Adlyxin (lixisenatide)  no     Anesthesia Prep Clinic Requirement:  no  Overnight Stay Required:  no  New CT CHEST Required: no  Additional Labs Required:  no        I personally reviewed the radiographic studies and evaluated the patient.   I discussed the plan above with the patient.  Fairy KANDICE Ada expressed an understanding of this discussion and  recommendations above.  All questions were answered to patient's satisfaction.  The patient has my contact information, and understands to call with any additional questions or concerns.    I am seeing this patient for ongoing, long-term management of their chronic pulmonary condition (intrathoracic lymphadenopathy).  This is a chronic complex medical condition for which I have been managing longitudinally.       Sula RONAL Iva Bonnita, MD  Interventional Pulmonology   LPA# [id number]        CC:  Elvin Sober, NP  [address]  [address]  [place],  [place] [address]  Patient Care Team:  Valera Starring, MD as PCP - [facility])

## 2024-02-27 ENCOUNTER — Ambulatory Visit: Attending: Pulmonary Medicine | Admitting: Pulmonary Medicine

## 2024-02-27 VITALS — BP 155/83 | HR 67 | Temp 98.0°F | Resp 15 | Ht 71.0 in | Wt 212.0 lb

## 2024-02-27 DIAGNOSIS — R590 Localized enlarged lymph nodes: Secondary | ICD-10-CM | POA: Insufficient documentation

## 2024-02-27 NOTE — Patient Instructions (Signed)
 Thank you for coming to see us  in Interventional Pulmonology Clinic.     Please note the following is your plan:    - we will do bronchoscopy with biopsy of the lymph nodes     If bronchoscopy/pleuroscopy is recommended:  - Nothing to eat or drink after midnight the night before bronchoscopy/pleuroscopy.    Diagnoses and all orders for this visit:    Hilar adenopathy  -     Consult/Referral to Pulmonary and Sleep  -     IP Clinic Routine (f/u)  -     Case Request: BRONCHOSCOPY, WITH ENDOBRONCHIAL ULTRASOUND (EBUS) GUIDED SAMPLING WITH POSSIBLE CRYOBIOPSY OF LYMPH NODES        No follow-ups on file.      --------------------------------------------------------------------------  Your Team   Clinic Schedule: Monday 8AM-12:30PM    PROVIDERS:   Zachary Shoe, MD PhD  Eliot Fleeta Roan, MD  Cleotis Blanch, MD  Sula Quarry, MD  Nelwyn Pinal, MD  Burnard Furnace, NP    Interventional Pulmonology Fellow: Worth Birmingham, MD MPH    CLINICAL ADMINISTRATIVE ASSISTANTS:  Josselyn Riturban (M-F 8am-430pm)  Mennie Heal (M-F 8am-430pm)  Carleen Patch (M-F 8am-430pm)    Phone: [phone]  Fax: [phone]    Please contact them for assistance with scheduling/rescheduling appointments with our providers and sharing of medical records    Please use MyChart to communicate any questions or needs for prescription refills.                                                                                                                                                  If you have a medical emergency or life threatening situaton, please call 911 or go to the nearest Emergency Room.     PRESCRIPTION REFILLS:  In an effort to decrease medication refill delays due to weekends, please note that all prescription refill requests should be completed prior to Thursdays at noon. Any requests made after this time may not be filled until the following Monday. You may also have your pharmacy fax a request to the fax number above.     AFTER-HOURS  CARE:   If you develop a fever of 100.29F or greater for an hour, or at the first episode of a fever of 101F or greater, please call On-Call Pulmonary or Interventional Pulmonary Fellow, you may have to go to the ER; please come to the ER at [place] if possible.  If the fever happens after hours (On the weekends and/or Monday through Friday before 8:00am or after 4:30pm) please call the [facility] and ask to speak with the [facility] Pulmonology Fellow.      For any urgent/emergent symptoms that happen after hours (Weekends & Monday - Friday before 8:00am and after 4:30pm) please call   [facility] and ask for the [facility] Pulmonology Fellow.  [facility] Phone List for Patients   Clinic Hours of Operation: Monday-Friday 8:00-4:30p.m. (closed holidays and weekends)     Information Desk: [phone]   ** General information: directions, telephone numbers, or available services     Radiology (CT) Scheduling: [phone]     PET/CT or PET scheduler: [phone]    Pulmonary Function and Exercise Laboratories:   [facility]:  [phone]  [facility]:  [phone]    [facility]: phone [phone]/ fax [phone]  Open M-F 9AM-5PM     Medical Records: [phone] / Fax [phone]   [email or web address]    Requests for Radiology Records / X-Ray Images  As of Aug. 28, 2018, all imaging results -- including computed tomography (CT), magnetic resonance imaging (MRI), ultrasound, and others -- are released to patients' [facility] account automatically. Normal results are released four days after the result is available, and abnormal results are released seven days after the result is available.    For previous imaging records, or if you do not have a [facility] account, please use release of information authorization forms, or contact Radiology/Imaging Services at [phone]. Radiology may be able to release images by email or  answer questions about release of X-rays and other images.     [facility] (for oxygen supplies): [phone]

## 2024-03-13 ENCOUNTER — Ambulatory Visit (INDEPENDENT_AMBULATORY_CARE_PROVIDER_SITE_OTHER): Admitting: Neurology

## 2024-03-13 DIAGNOSIS — G4733 Obstructive sleep apnea (adult) (pediatric): Secondary | ICD-10-CM

## 2024-03-13 NOTE — Procedures (Signed)
 [facility]  [address] [place] [address]  Phone [phone]      MEDICAL EQUIPMENT DISCLOSURE      VARTAN KERINS Date of Birth: [birth year]  MRN [id number]      The equipment patient is taking is a Home Sleep Test 03/13/24:  He/She was referred for a home sleep test (HST) for symptoms and concerns associated with OSA. Education of the [employer] equipment was completed by Selinda Register, RPSGT.    Visit Summary: Instruction was given on the use and proper placement of each component of the HST.  After reviewing the procedure with the sleep tech, the patient verbally demonstrated how to properly place the HST device and all necessary components (respiratory belt, ECG, airflow/nasal pressure, and SpO2).  The patient was given an on call number of [phone] to call with any questions they may have.     This equipment is for diagnostic purposes only.    Further, your signature below indicates that patient has been instructed on operating the device.    Instructor's Name:  Selinda Register, RPSGT      Date: 03/13/24    Electronically signed by, Selinda Register, RPSGT

## 2024-03-20 ENCOUNTER — Ambulatory Visit: Admitting: Bariatrics

## 2024-03-20 ENCOUNTER — Encounter: Payer: Self-pay | Admitting: Bariatrics

## 2024-03-20 ENCOUNTER — Encounter (HOSPITAL_BASED_OUTPATIENT_CLINIC_OR_DEPARTMENT_OTHER): Payer: Self-pay

## 2024-03-20 VITALS — BP 133/84 | HR 83 | Temp 97.6°F | Ht 76.0 in | Wt 263.0 lb

## 2024-03-20 DIAGNOSIS — E785 Hyperlipidemia, unspecified: Secondary | ICD-10-CM | POA: Diagnosis not present

## 2024-03-20 DIAGNOSIS — E669 Obesity, unspecified: Secondary | ICD-10-CM

## 2024-03-20 DIAGNOSIS — Z6832 Body mass index (BMI) 32.0-32.9, adult: Secondary | ICD-10-CM

## 2024-03-20 DIAGNOSIS — E66811 Obesity, class 1: Secondary | ICD-10-CM

## 2024-03-20 DIAGNOSIS — G4733 Obstructive sleep apnea (adult) (pediatric): Secondary | ICD-10-CM

## 2024-03-20 NOTE — Procedures (Signed)
 Scoring Tech Checklist for Sleep Study Process  Steps to Complete:  Verify Orders:  [x]  Ensure that the sleep study order in G3 matches the order in EPIC.  Generate Correct Report:  [x]  Confirm that the correct report type is generated:  [x]  HST (Home Sleep Test)  [x]  Is the recording more than 6 hours (360 minutes of recording) , if no please document why.   []  PSG (Polysomnography)  []  SPLIT  []  TITRATION  Document on First Epoch:  [x]  Document your name on the first [facility] of the sleep study.  Enter Data in Custom Properties:  [x]  Enter all required data in the custom properties section in G3.  Document Appropriate Notes:  []  Include appropriate notes in the report for the following, if applicable:  []  CPAP (Continuous Positive Airway Pressure)  []  OAT (Oral Appliance Therapy)  []  SPLIT (Split-night Study)  []  O2 (Oxygen Therapy)  []  ASV (Adaptive Servo-Ventilation)  []  INSPIRE (Hypoglossal Nerve Stimulation)  Confirmed Diagnosis Code:  [x]  Confirm that the diagnosis code has been entered correctly.  Recording Tech Notes:  [x]  Confirm that recording tech notes have been entered.  Assign Study to Provider:  [x]  Confirm that the sleep study has been assigned to the correct reading provider.

## 2024-03-20 NOTE — Progress Notes (Signed)
 WEIGHT SUMMARY AND BIOMETRICS  Weight Lost Since Last Visit: 5lb  Weight Gained Since Last Visit: 0   Vitals Temp: 97.6 F (36.4 C) BP: 133/84 Pulse Rate: 83 SpO2: 96 %   Anthropometric Measurements Height: 6' 4 (1.93 m) Weight: 263 lb (119.3 kg) BMI (Calculated): 32.03 Weight at Last Visit: 268lb Weight Lost Since Last Visit: 5lb Weight Gained Since Last Visit: 0 Starting Weight: 282lb Total Weight Loss (lbs): 19 lb (8.618 kg) Peak Weight: 292lb   Body Composition  Body Fat %: 26.5 % Fat Mass (lbs): 69.8 lbs Muscle Mass (lbs): 184.2 lbs Total Body Water (lbs): 131.2 lbs Visceral Fat Rating : 15   Other Clinical Data Fasting: no Labs: no Today's Visit #: 5 Starting Date: 12/27/23    OBESITY Dylan Hatfield is here to discuss his progress with his obesity treatment plan along with follow-up of his obesity related diagnoses.    Nutrition Plan: the Category 4 plan - 65% adherence.  Current exercise: none  Interim History:  He is down 5 lbs since his last visit. He has been getting enough protein.  Eating all of the food on the plan., Protein intake is as prescribed, Is not skipping meals, and Water intake is adequate.  Hunger is moderately controlled.  Cravings are moderately controlled.  Assessment/Plan:   Hyperlipidemia LDL is not at goal. Medication(s): none Cardiovascular risk factors: advanced age (older than 9 for men, 96 for women), dyslipidemia, hypertension, male gender, obesity (BMI >= 30 kg/m2), and sedentary lifestyle  Lab Results  Component Value Date   CHOL 192 12/27/2023   HDL 28 (L) 12/27/2023   LDLCALC 131 (H) 12/27/2023   LDLDIRECT 123.0 12/28/2022   TRIG 185 (H) 12/27/2023   CHOLHDL 6 12/28/2022   Lab Results  Component Value Date   ALT 30 12/27/2023   AST 21 12/27/2023   ALKPHOS 52 12/27/2023   BILITOT 1.2 12/27/2023    The 10-year ASCVD risk score (Arnett DK, et al., 2019) is: 13.6%   Values used to calculate the score:     Age: 58 years     Clincally relevant sex: Male     Is Non-Hispanic African American: No     Diabetic: No     Tobacco smoker: No     Systolic Blood Pressure: 133 mmHg     Is BP treated: Yes     HDL Cholesterol: 28 mg/dL     Total Cholesterol: 192 mg/dL  Plan:  Will avoid all trans fats.  Will read labels Will minimize saturated fats except the following: low fat meats in moderation, diary, and limited dark chocolate.  Information sheets on healthy/unhealthy fats.  Will consider a coronary calcium score if his cholesterol levels do not improve.     Generalized Obesity: Current BMI BMI (Calculated): 32.03    Dylan Hatfield is currently in the action stage of change. As  such, his goal is to continue with weight loss efforts.  He has agreed to the Category 4 plan.  Exercise goals: For substantial health benefits, adults should do at least 150 minutes (2 hours and 30 minutes) a week of moderate-intensity, or 75 minutes (1 hour and 15 minutes) a week of vigorous-intensity aerobic physical activity, or an equivalent combination of moderate- and vigorous-intensity aerobic activity. Aerobic activity should be performed in episodes of at least 10 minutes, and preferably, it should be spread throughout the week.  Behavioral modification strategies: increasing lean protein intake, no meal skipping, meal planning , increase water intake, better snacking choices, planning for success, increasing vegetables, keep healthy foods in the home, weigh protein portions, measure portion sizes, and mindful eating.He is active at work.   Dylan Hatfield has agreed to follow-up with our clinic in 4 weeks.     Objective:   VITALS: Per patient if applicable, see vitals. GENERAL: Alert and in no acute distress. CARDIOPULMONARY: No increased WOB. Speaking in clear sentences.  PSYCH: Pleasant and cooperative. Speech  normal rate and rhythm. Affect is appropriate. Insight and judgement are appropriate. Attention is focused, linear, and appropriate.  NEURO: Oriented as arrived to appointment on time with no prompting.   Attestation Statements:   This was prepared with the assistance of Engineer, civil (consulting).  Occasional wrong-word or sound-a-like substitutions may have occurred due to the inherent limitations of voice recognition   Clayborne Daring, DO

## 2024-03-21 ENCOUNTER — Encounter (INDEPENDENT_AMBULATORY_CARE_PROVIDER_SITE_OTHER): Payer: Self-pay | Admitting: Hospital

## 2024-03-26 ENCOUNTER — Telehealth (HOSPITAL_BASED_OUTPATIENT_CLINIC_OR_DEPARTMENT_OTHER): Payer: Self-pay | Admitting: Pulmonary Medicine

## 2024-03-26 NOTE — Telephone Encounter (Signed)
 TC placed to inform patient to check in at 9:30am for 10:30 procedure

## 2024-03-26 NOTE — Anesthesia Preprocedure Evaluation (Addendum)
 ANESTHESIA PRE-OPERATIVE EVALUATION    Patient Information    Name: Henry Norman    MRN: [id number]    DOB: 03-20-1966    Age: 58 year old    Sex: male  Procedure(s):  BRONCHOSCOPY, WITH ENDOBRONCHIAL ULTRASOUND (EBUS) GUIDED SAMPLING WITH POSSIBLE CRYOBIOPSY OF LYMPH NODES      Pre-op Vitals:   There were no vitals taken for this visit.        Primary language spoken:  English    ROS/Medical History:      History of Present Illness: Henry Norman is a 58 year old male with a history of HLD, prior tobacco (20PY, quit 2007), OSA not on CPAP use who is evaluated for mLAD. Met with Dr. Chapman from general pulmonary on 02/09/24 and noted rapid weight loss despite good appetite. Chest imaging findings of hilar adenopathy were noted and he was referred for urgent EBUS.          General:  weight loss >10% of BW in last 6 months (20lb weight loss over a two month period, though has regained 6lbs at this point),   Cardiovascular:     Anesthesia History:  chronic pain patient (left knee),   Pulmonary:   sleep apnea,  Former smoker   Neuro/Psych:   negative neuro/psych ROS   Hematology/Oncology:       GI/Hepatic:  negative GI/hepatic ROS Infectious Disease:  negative for infectious disease     Renal:  negative renal ROS   Endocrine/Other:  negative endo/other ROS,      Pregnancy History:   Pediatrics:         Pre Anesthesia Testing (PCC/CPC) notes/comments:               Physical Exam    Airway:     Inter-inciser distance > 4 cm  Prognanth Able    Mallampati: I  Neck ROM: full  TM distance: > 6 cm  Short thick neck: No          Cardiovascular:  - cardiovascular exam normal             Pulmonary:  - pulmonary exam normal              Neuro/Neck/Skeletal/Skin:  - Neck/Neuro/Skeletal/Skin exam normal          Dental:    (+) upper dentures and lower dentures    Abdominal:   - normal exam           Additional Clinical Notes:           Last OSA (STOP BANG) Score:   No data recorded    Past Medical History:   Diagnosis Date   .  Preoperative clearance 08/29/2017   . Screening for colon cancer 11/05/2020   . Screening for endocrine, metabolic and immunity disorder 11/05/2020     Past Surgical History:   Procedure Laterality Date   . KNEE SURGERY Left 09/27/2004    Meniscus Tear   . KNEE SURGERY Left 09/27/1986    Torn ACL   . SHOULDER SURGERY Left     labrum tear     none     Socioeconomic History   . Marital status: Married   Tobacco Use   . Smoking status: Former     Current packs/day: 0.00     Types: Cigarettes     Quit date: 2006     Years since quitting: 19.5   . Smokeless tobacco: Former   Substance and Sexual  Activity   . Alcohol use: No     Alcohol Use: Not on file       Current Outpatient Medications   Medication Sig Dispense Refill   . hydrOXYzine  HCL (ATARAX ) 25 MG tablet Take 1 tablet (25 mg) by mouth 2 times daily.     . mirtazapine  (REMERON ) 15 MG tablet Take 1 tablet (15 mg) by mouth nightly.  0     Allergies   Allergen Reactions   . Fluoxetine Palpitations   . Paroxetine Palpitations       Labs and Other Data  Lab Results   Component Value Date    NA 138 01/11/2024    K 4.9 01/11/2024    CL 100 01/11/2024    BICARB 33 (H) 01/11/2024    BUN 17 01/11/2024    CREAT 1.03 01/11/2024    GLU 89 01/11/2024    CA 10.0 01/11/2024     Lab Results   Component Value Date    AST 17 01/11/2024    ALT 15 01/11/2024    ALK 59 01/11/2024    TP 7.1 01/11/2024    ALB 4.8 01/11/2024    TBILI 0.6 01/11/2024     Lab Results   Component Value Date    WBC 6.8 01/11/2024    RBC 5.81 (H) 01/11/2024    HGB 17.2 (H) 01/11/2024    HCT 51.8 (H) 01/11/2024    MCV 89.2 01/11/2024    MCHC 33.2 01/11/2024    RDW 12.2 01/11/2024    PLT 261 01/11/2024    MPV 9.1 01/11/2024    SEG 60.9 01/11/2024    LYMPHS 29.9 01/11/2024    MONOS 7.2 01/11/2024    EOS 1.6 01/11/2024    BASOS 0.4 01/11/2024     No results found for: INR, PTT  No results found for: ARTPH, ARTPO2, ARTPCO2    Anesthesia Plan:  Risks and Benefits of Anesthesia  I have personally performed  an appropriate pre-anesthesia physical exam of the patient (including heart, lungs, and airway) prior to the anesthetic and reviewed the pertinent medical history, drug and allergy history, laboratory and imaging studies and consultations.   I have determined that the patient has had adequate assessment and testing.  I have validated the documentation of these elements of the patient exam and/or have made necessary changes to reflect my own observations during my pre-anesthesia exam.  Anesthetic techniques, invasive monitors, anesthetic drugs for induction, maintenance and post-operative analgesia, risks and alternatives have been explained to the patient and/or patient's representatives.    I have prescribed the anesthetic plan:         Planned anesthesia method: General         ASA 3 (Severe systemic disease)     Potential anesthesia problems identified and risks including but not limited to the following were discussed with patient and/or patient's representative: Adverse or allergic drug reaction, Administration of blood products, Recall, Ocular injury, Dental injury or sore throat, Nerve injury, Injury to brain, heart and other organs and Death    No Beta Blocker Indicated: Does not meet criteria    Planned monitoring method: Routine monitoring    Informed Consent:  Anesthetic plan and risks discussed with Patient.  Use of blood products discussed with patient who consented to blood products.         Plan discussed with OR Nurse, Surgeon and Attending.

## 2024-03-27 ENCOUNTER — Ambulatory Visit (HOSPITAL_BASED_OUTPATIENT_CLINIC_OR_DEPARTMENT_OTHER)

## 2024-03-27 ENCOUNTER — Ambulatory Visit (HOSPITAL_BASED_OUTPATIENT_CLINIC_OR_DEPARTMENT_OTHER): Admitting: Student in an Organized Health Care Education/Training Program

## 2024-03-27 ENCOUNTER — Ambulatory Visit
Admission: RE | Admit: 2024-03-27 | Discharge: 2024-03-27 | Disposition: A | Discharge status: Home / Self Care | Attending: Pulmonary Medicine | Admitting: Pulmonary Medicine

## 2024-03-27 ENCOUNTER — Encounter (HOSPITAL_COMMUNITY): Admission: RE | Disposition: A | Discharge status: Home Health | Payer: Self-pay | Attending: Pulmonary Medicine

## 2024-03-27 DIAGNOSIS — G4733 Obstructive sleep apnea (adult) (pediatric): Secondary | ICD-10-CM | POA: Insufficient documentation

## 2024-03-27 DIAGNOSIS — R590 Localized enlarged lymph nodes: Secondary | ICD-10-CM | POA: Insufficient documentation

## 2024-03-27 DIAGNOSIS — Z87891 Personal history of nicotine dependence: Secondary | ICD-10-CM | POA: Insufficient documentation

## 2024-03-27 DIAGNOSIS — E785 Hyperlipidemia, unspecified: Secondary | ICD-10-CM | POA: Insufficient documentation

## 2024-03-27 DIAGNOSIS — Z79899 Other long term (current) drug therapy: Secondary | ICD-10-CM | POA: Insufficient documentation

## 2024-03-27 LAB — BODY FLUID CELL CT / DIFF
Fluid Number of Total Nucleated Cell Counted: 100
Fluid Other Non Hematogenous Cells: 34 %
Lymphocytes fluid %: 14 %
Macrophages fluid %: 20 %
Manual Fluid Total Nucleated Cell Count: 21 uL
Neutrophils Fluid %: 32 %

## 2024-03-27 LAB — PNEUMONIA PATHOGENS NUCLEIC ACID DETECTION TEST
(NON-COVID-19) Coronavirus PCR, BAL: NOT DETECTED
Acinetobacter calcoaceticus-baumannii complex PCR, BAL: NOT DETECTED
Adenovirus PCR, BAL: NOT DETECTED
Chlamydophila (Chlamydia) pneumoniae PCR, BAL: NOT DETECTED
Enterobacter cloacae complex PCR, BAL: NOT DETECTED
Escherichia coli PCR, BAL: NOT DETECTED
Haemophilus influenzae PCR, BAL: NOT DETECTED
Human Metapneumovirus PCR, BAL: NOT DETECTED
Human Rhinovirus/Entrovirus PCR, BAL: NOT DETECTED
Influenza A Virus PCR, BAL: NOT DETECTED
Influenza B Virus PCR, BAL: NOT DETECTED
Klebsiella aerogenes PCR, BAL: NOT DETECTED
Klebsiella oxytoca PCR, BAL: NOT DETECTED
Klebsiella pneumoniae complex PCR, BAL: NOT DETECTED
Legionella pneumophila PCR, BAL: NOT DETECTED
Moraxella catarrhalis PCR, BAL: NOT DETECTED
Mycoplasma pneumoniae PCR, BAL: NOT DETECTED
Parainfluenza virus PCR, BAL: NOT DETECTED
Proteus species PCR, BAL: NOT DETECTED
Pseudomonas aeruginosa PCR, Bal: NOT DETECTED
Respiratory syncytial virus PCR, BAL: NOT DETECTED
Serratia marcescens PCR, BAL: NOT DETECTED
Staphylococcus aureus PCR, BAL: NOT DETECTED
Streptococcus agalactiae (Group B) PCR, BAL: NOT DETECTED
Streptococcus pneumoniae PCR, BAL: NOT DETECTED
Streptococcus pyogenes (Group A) PCR, BAL: NOT DETECTED

## 2024-03-27 SURGERY — BRONCHOSCOPY, WITH ENDOBRONCHIAL ULTRASOUND (EBUS) GUIDED SAMPLING
Anesthesia: General | Site: Bronchus

## 2024-03-27 MED ORDER — LIDOCAINE HCL 2 % IJ SOLN WRAPPED RECORD
INTRAMUSCULAR | Status: DC | PRN
Start: 2024-03-27 — End: 2024-03-27
  Administered 2024-03-27: 100 mg via INTRATHECAL

## 2024-03-27 MED ORDER — PROPOFOL 200 MG/20ML IV EMUL
INTRAVENOUS | Status: DC | PRN
Start: 2024-03-27 — End: 2024-03-27
  Administered 2024-03-27: 150 mg via INTRAVENOUS

## 2024-03-27 MED ORDER — TRANEXAMIC ACID 1000 MG/10ML IV SOLN
INTRAVENOUS | Status: AC
Start: 2024-03-27 — End: 2024-03-27
  Filled 2024-03-27: qty 10

## 2024-03-27 MED ORDER — PROPOFOL 1000 MG/100ML IV EMUL
INTRAVENOUS | Status: DC | PRN
Start: 2024-03-27 — End: 2024-03-27
  Administered 2024-03-27: 150 ug/kg/min via INTRAVENOUS

## 2024-03-27 MED ORDER — FENTANYL CITRATE (PF) 100 MCG/2ML IJ SOLN
INTRAMUSCULAR | Status: AC
Start: 2024-03-27 — End: 2024-03-27
  Filled 2024-03-27: qty 2

## 2024-03-27 MED ORDER — NALOXONE HCL 0.4 MG/ML IJ SOLN
0.1000 mg | INTRAMUSCULAR | Status: DC | PRN
Start: 2024-03-27 — End: 2024-03-27

## 2024-03-27 MED ORDER — FENTANYL CITRATE (PF) 250 MCG/5ML IJ SOLN
INTRAMUSCULAR | Status: DC | PRN
Start: 2024-03-27 — End: 2024-03-27
  Administered 2024-03-27: 100 ug via INTRAVENOUS

## 2024-03-27 MED ORDER — ROCURONIUM BROMIDE 100 MG/10ML IV SOLN
INTRAVENOUS | Status: DC | PRN
Start: 2024-03-27 — End: 2024-03-27
  Administered 2024-03-27: 30 mg via INTRAVENOUS

## 2024-03-27 MED ORDER — LIDOCAINE HCL 2 % IJ SOLN WRAPPED RECORD
INTRAMUSCULAR | Status: DC | PRN
Start: 2024-03-27 — End: 2024-03-27
  Administered 2024-03-27 (×4): 4 mL

## 2024-03-27 MED ORDER — SUGAMMADEX SODIUM 200 MG/2ML IV SOLN
INTRAVENOUS | Status: DC | PRN
Start: 2024-03-27 — End: 2024-03-27
  Administered 2024-03-27: 200 mg via INTRAVENOUS

## 2024-03-27 MED ORDER — FENTANYL CITRATE (PF) 50 MCG/ML IJ SOLN (WRAPPED RECORD) ~~LOC~~
25.0000 ug | INTRAMUSCULAR | Status: DC | PRN
Start: 2024-03-27 — End: 2024-03-27

## 2024-03-27 MED ORDER — MIDAZOLAM HCL 2 MG/2ML IJ SOLN
INTRAMUSCULAR | Status: AC
Start: 2024-03-27 — End: 2024-03-27
  Filled 2024-03-27: qty 2

## 2024-03-27 MED ORDER — LABETALOL HCL 5 MG/ML IV SOLN
5.0000 mg | INTRAVENOUS | Status: DC | PRN
Start: 2024-03-27 — End: 2024-03-27

## 2024-03-27 MED ORDER — LACTATED RINGERS IV SOLN
INTRAVENOUS | Status: DC | PRN
Start: 2024-03-27 — End: 2024-03-27

## 2024-03-27 MED ORDER — FENTANYL CITRATE (PF) 50 MCG/ML IJ SOLN (WRAPPED RECORD) ~~LOC~~
50.0000 ug | INTRAMUSCULAR | Status: DC | PRN
Start: 2024-03-27 — End: 2024-03-27

## 2024-03-27 MED ORDER — DEXAMETHASONE SODIUM PHOSPHATE 4 MG/ML IJ SOLN (CUSTOM)
INTRAMUSCULAR | Status: DC | PRN
Start: 2024-03-27 — End: 2024-03-27
  Administered 2024-03-27: 6 mg via INTRAVENOUS

## 2024-03-27 MED ORDER — ONDANSETRON HCL 4 MG/2ML IV SOLN
4.0000 mg | Freq: Once | INTRAMUSCULAR | Status: DC | PRN
Start: 2024-03-27 — End: 2024-03-27

## 2024-03-27 MED ORDER — LIDOCAINE HCL 2 % IJ SOLN WRAPPED RECORD
INTRAMUSCULAR | Status: AC
Start: 2024-03-27 — End: 2024-03-27
  Filled 2024-03-27: qty 20

## 2024-03-27 MED ORDER — MIDAZOLAM HCL 2 MG/2ML IJ SOLN
INTRAMUSCULAR | Status: DC | PRN
Start: 2024-03-27 — End: 2024-03-27
  Administered 2024-03-27: 2 mg via INTRAVENOUS

## 2024-03-27 MED ORDER — EPINEPHRINE 1 MG/10ML IJ SOSY
PREFILLED_SYRINGE | INTRAMUSCULAR | Status: AC
Start: 2024-03-27 — End: 2024-03-27
  Filled 2024-03-27: qty 10

## 2024-03-27 MED ORDER — ONDANSETRON HCL 4 MG/2ML IV SOLN
INTRAMUSCULAR | Status: DC | PRN
Start: 2024-03-27 — End: 2024-03-27
  Administered 2024-03-27: 4 mg via INTRAVENOUS

## 2024-03-27 MED ORDER — SODIUM CHLORIDE 0.9 % IV SOLN
INTRAVENOUS | Status: DC | PRN
Start: 2024-03-27 — End: 2024-03-27
  Administered 2024-03-27: 20 ug/min via INTRAVENOUS

## 2024-03-27 MED ORDER — OXYCODONE HCL 5 MG OR TABS
5.0000 mg | ORAL_TABLET | Freq: Once | ORAL | Status: DC | PRN
Start: 2024-03-27 — End: 2024-03-27

## 2024-03-27 MED ORDER — SUGAMMADEX SODIUM 500 MG/5ML IV SOLN
INTRAVENOUS | Status: AC
Start: 2024-03-27 — End: 2024-03-27
  Filled 2024-03-27: qty 5

## 2024-03-27 SURGICAL SUPPLY — 52 items
BALLOON EBUS (Misc Surgical Supply) ×1 IMPLANT
BOWL SPONGE STRL 16OZ (Misc Medical Supply) ×1 IMPLANT
BRUSH HARRELL CYTOLOGY #4 (Misc Medical Supply) IMPLANT
CANISTER SUCTION 1200CC (Tubing/Suction) ×1 IMPLANT
CANNULA NASAL FLARED W/7' TUBE (Misc Medical Supply) IMPLANT
CONNECTOR FLUID DISPENSING (Misc Medical Supply) ×1 IMPLANT
CONTAINER PRECISION SPECIMEN, 4OZ- STERILE (Misc Medical Supply) IMPLANT
CONTAINER PREFIL FORMALIN 60ML (Misc Medical Supply) ×2 IMPLANT
DRESSING SPONGE CURITY 4X4 16 PLY (Dressings/packing) ×1 IMPLANT
EXTENDER TRACHEA, STERILE (Misc Medical Supply) IMPLANT
FLEXIBLE CRYOPROBE SINGLE USE 1.1MM X 1150MM W/OVERSHEATH LENGTH 817MM BOX/5 (Misc Medical Supply) ×1 IMPLANT
FLUID SILICONE .5 OZ (Misc Medical Supply) IMPLANT
FORCEP BIOPSY ALLIGATOR JAW (Misc Medical Supply) IMPLANT
FORCEP BIOPSY RADIAL JAW 3 PULMONARY STD 2MM X 100CM (Needles/punch/cannula/biopsy) IMPLANT
FORCEPSPRECISOR BRONCH DISPOSABLE ALLIGATOR CUP (Misc Medical Supply) IMPLANT
GLOVE BIOGEL PI ULTRATOUCH SIZE 7.5 (Gloves/Gowns) IMPLANT
GLOVE BIOGEL PI ULTRATOUCH SIZE 8 (Gloves/Gowns) IMPLANT
GLOVE BIOGEL SUPER-SENSITIVE SIZE 7.5 (Gloves/Gowns) IMPLANT
GLOVE SKINSENSE PF LF SZ 6.5 (Gloves/Gowns) IMPLANT
GLOVE SURGICAL BIOGEL SIZE 8 (Gloves/Gowns) IMPLANT
INTERSURG GREEN ADAPTOR AKA CONNECTOR TRACH SWIVEL (Misc Medical Supply) ×1 IMPLANT
MASK ADULT TRACH AEROSOL (Anesthesia Supply) IMPLANT
MASK OXYGEN ADULT SOFT DSP LF (Anesthesia Supply) IMPLANT
NEEDLE ASPIRATION EBUS SINGLE-USE 21 GAUGE (NEWER VERSION) (Needles/punch/cannula/biopsy) IMPLANT
NEEDLE ASPIRATION SINGLE USE 19GA (Needles/punch/cannula/biopsy) ×1 IMPLANT
NEEDLE ASPIRATION SINGLE USE 21 GAUGE (Needles/punch/cannula/biopsy) IMPLANT
NEEDLE ASPIRATION SMOOTHSHOT 21 GAUGE (Needles/punch/cannula/biopsy) IMPLANT
NEEDLE BLUNT FILL 18G X 1.5" (Needles/punch/cannula/biopsy) IMPLANT
NEEDLE EBUS FNA EXPECT PULMONARY 25GA (Needles/punch/cannula/biopsy) IMPLANT
NEEDLE VIZISHOT EBUS 22G NA-201SX-4022-A (OLD VERSION) (Needles/punch/cannula/biopsy) ×1 IMPLANT
NEEDLE WANG TRANSBRONCH 21GA INNER 19G OUTER (Needles/punch/cannula/biopsy) IMPLANT
NEEDLELESS VIAL ACCESS (Needles/punch/cannula/biopsy) ×1 IMPLANT
SOLUTION IRR POUR BTL .9% N/S 500 (Non-Pharmacy Meds/Solutions) ×2 IMPLANT
SOLUTION IRR POUR BTL 0.9% NS 1000ML (Non-Pharmacy Meds/Solutions) IMPLANT
SPONGE ENDO-SCRUB (Misc Medical Supply) IMPLANT
STOPCOCK 3-WAY W/SWIVEL MALE LUER LOCK (Needles/punch/cannula/biopsy) ×1 IMPLANT
SYRINGE HYPO LL 20CC (Needles/punch/cannula/biopsy) ×1 IMPLANT
SYRINGE HYPO LL 3CC (Needles/punch/cannula/biopsy) ×1 IMPLANT
SYRINGE HYPO LL 5CC (Needles/punch/cannula/biopsy) IMPLANT
SYRINGE HYPO LS 10CC (Needles/punch/cannula/biopsy) ×1 IMPLANT
SYRINGE HYPO LS 20CC (Needles/punch/cannula/biopsy) ×4 IMPLANT
SYRINGE HYPO LS 5CC (Needles/punch/cannula/biopsy) IMPLANT
SYRINGE HYPO LS 60CC (Needles/punch/cannula/biopsy) IMPLANT
TIP NEEDLE SUPERTRAX CYTOLOGY BRUSH (Misc Medical Supply) IMPLANT
TOWELS OR BLUE 4-PACK STERILE, DISPOSABLE (Drapes/towels) IMPLANT
TRAP LUKENS SPECIMEN 40ML STERILE (Misc Medical Supply) ×2 IMPLANT
TRAP SPECIMEN 70CC LTX (Misc Medical Supply) ×1 IMPLANT
TUBING CONN SUCTN 1/4 X 6 (Tubing/Suction) ×2 IMPLANT
TUBING CORR-A-FLEX CORRUGATED 72" (Tubing/Suction) IMPLANT
VALVE BIOPSY OLYMPUS MAJ-210- SINGLE USE (Tubing/Suction) ×1 IMPLANT
VALVE SUCTION OLYMPUS- SINGLE USE (Tubing/Suction) ×1 IMPLANT
WARMER SCOPE 15" PRE SURG (Misc Surgical Supply) IMPLANT

## 2024-03-27 NOTE — Discharge Instructions (Signed)
 You have received sleeping medicines for your bronchoscopy.      FOR YOUR SAFETY:  1. You should NOT operate machines, drink alcohol, or take sleeping/nerve drugs for 24 hours unless the doctor tells you to.  You should NOT swim, ride a bicycle, skateboard or scooter, use roller blades or engage in rough physical play.  2. Avoid signing legal papers or making important decisions for 24 hours.  3. You should NOT drive for AT LEAST 24 HOURS. Patients who get sleeping drugs will be discharged only if an adult leaves with them.  After 24 hours you may drive if you no longer feel drowsy.    POST PROCEDURE INSTRUCTIONS:  1. You may resume your regular diet.  2. You may resume your normal prescription drugs unless told otherwise.  3. You may take Tylenol for low grade fevers the night of the procedure.  4. Rest for the remainder of the day.  You may resume normal activities tomorrow.   5. You may return to work or school tomorrow.   6. You may have a slight sore throat following the procedure.  Throat lozenges may be used as needed for a sore throat.      CALL YOUR DOCTOR FOR THE FOLLOWING:  * Coughing up large amount of blood clots (2 TBSP or more)  * Severe/worsening pain or vomiting  * Any chest pain  * Shortness of breath different than what you normally have  * Redness, pain or swelling at IV site that lasts greater than 2 days  * Fever the first night is expected.  Call if persistent or greater than 101    For any questions regarding your procedure please contact us at the numbers below.      Call 911 for any Emergency, Chest pain or Shortness of breath.    For procedures completed at the [facility] [place] please call [phone].  Monday - Friday 8am - 5pm     After hours or on weekends, please call [phone] and ask for pulmonology fellow on-call.

## 2024-03-27 NOTE — H&P (Signed)
 PRE-PROCEDURE HISTORY AND PHYSICAL     Henry Norman presents for his scheduled Procedure(s):  BRONCHOSCOPY, WITH ENDOBRONCHIAL ULTRASOUND (EBUS) GUIDED SAMPLING WITH POSSIBLE CRYOBIOPSY OF LYMPH NODES.    The indication for the procedure(s) is Hilar adenopathy.    There have been no significant recent changes in the patient's medical status.    Past Medical History:   Diagnosis Date    Preoperative clearance 08/29/2017    Screening for colon cancer 11/05/2020    Screening for endocrine, metabolic and immunity disorder 11/05/2020     Past Surgical History:   Procedure Laterality Date    KNEE SURGERY Left 09/27/2004    Meniscus Tear    KNEE SURGERY Left 09/27/1986    Torn ACL    SHOULDER SURGERY Left     labrum tear       Allergies  Allergies   Allergen Reactions    Fluoxetine Palpitations    Paroxetine Palpitations       Medications  No current facility-administered medications on file prior to encounter.     Current Outpatient Medications on File Prior to Encounter   Medication Sig Dispense Refill    hydrOXYzine  HCL (ATARAX ) 25 MG tablet Take 1 tablet (25 mg) by mouth 2 times daily.      mirtazapine  (REMERON ) 15 MG tablet Take 1 tablet (15 mg) by mouth nightly.  0       Physical Examination  BP 137/85 (BP Location: Right arm, BP Patient Position: Semi-Fowlers)   Pulse 59   Temp 97 F (36.1 C)   Resp 16   SpO2 95%   There is no height or weight on file to calculate BMI.  ASA Status: 3 - Moderate to severe systemic disease that limits activity but not incapacitating  Mental Status: alert  Mallimpati Score: Class I - Soft palate, uvula, fauces, and pillars are visible.  Lungs: clear to auscultation  Heart: normal  Abdomen: abdomen is soft without significant tenderness, masses, organomegaly or guarding    LABS:  CBC:  Lab Results   Component Value Date    WBC 6.8 01/11/2024    HGB 17.2 (H) 01/11/2024    HCT 51.8 (H) 01/11/2024    PLT 261 01/11/2024     CHEM:  Lab Results   Component Value Date    NA 138 01/11/2024     K 4.9 01/11/2024    CL 100 01/11/2024    BICARB 33 (H) 01/11/2024    BUN 17 01/11/2024    CREAT 1.03 01/11/2024    GLU 89 01/11/2024    CA 10.0 01/11/2024     COAG:  No results found for: PT, PTT, INR  LFTs:  Lab Results   Component Value Date    AST 17 01/11/2024    ALT 15 01/11/2024    ALK 59 01/11/2024    TP 7.1 01/11/2024    ALB 4.8 01/11/2024    TBILI 0.6 01/11/2024        ASSESSMENT AND PLAN   [name] has been evaluated and deemed appropriate to undergo the planned Procedure(s):  BRONCHOSCOPY, WITH ENDOBRONCHIAL ULTRASOUND (EBUS) GUIDED SAMPLING WITH POSSIBLE CRYOBIOPSY OF LYMPH NODES      Zachary Adelita Shoe, MD, PhD  03/27/2024

## 2024-03-27 NOTE — Op Note (Addendum)
 INTERVENTIONAL PULMONOLOGY OPERATIVE REPORT     DATE OF PROCEDURE: 03/27/2024    CC Referred Physician:  Elvin Sober, NP  [address]  [address]  [place],  [place] [address]    INDICATION FOR OPERATION:  Henry Norman is a 58 year old-year-old male who presents with lymphadenopathy.  The nature, purpose, risks, benefits and alternatives to Bronchoscopy were discussed with the patient in detail.  Patient indicated a wish to proceed with surgery and informed consent was signed.    CONSENT : Obtained before the procedure. Its indications and potential complications and alternatives were discussed with the patient or surrogate. The patient or surrogate read and signed the provided consent form / provided consent over the phone. The consent was witnessed by an assisting medical professional.    PREOPERATIVE DIAGNOSIS: R59.0 Localized enlarged lymph nodes    POSTOPERATIVE DIAGNOSIS:  R59.0 Localized enlarged lymph nodes    PROCEDURE:    31645 Therapeutic aspiration initial episode  68375 Dx bronchoscope/lavage (BAL)      31653 EBUS sampling 3 or more nodes    76981 Ultrasound Elastography, Parenchyma of Organ  76982 Ultrasound Elastography, First Target Lesion  [phone] Ultrasound Elastography, Additional Targets   [phone] Ultrasound Elastography, third targets    22 Substantially greater work than normal (i.e., increased intensity, time, technical difficulty of procedure, and severity of patient's condition, physical and mental effort required)    IP [place] CODE MOD DETAILS:   Unusual Procedure:  This patient required a EBUS lymph node forceps/cryo biopsies. This resulted in >40% increased work due to Scientist, product/process development difficulty of procedure and Physical and mental effort required. Apply to: 68346 EBUS sampling 3 or more nodes  .       ATTENDING:    Zachary Shoe MD PhD    ASSISTANT:   none    Support Staff:   RN: Natalie Montehermoso  RT: Gwenn Mater    ANESTHESIA:   General Anesthesia    MONITORING : Pulse oximetry, heart  rate, telemetry, and BP were continuously monitored by an independent trained observer that was present throughout the entire procedure.    INSTRUMENT :   Flexible Therapeutic Bronchoscope  Linear EBUS     ESTIMATED BLOOD LOSS:   None    COMPLICATIONS: None    PROCEDURE IN DETAIL:  After the successful induction of anesthesia, a timeout was performed (confirming the patient's name, procedure type, and procedure location).    Initial Airway Inspection Findings:    Successful therapeutic aspiration was performed to clean out the Right Mainstem, Bronchus Intermedius , and Left Mainstem from mucus.     Bronchial alveolar lavage was performed at Medial Segment of RML (RB5).  Instilled 60 cc of NS, suction returned with 20 cc of NS.  Samples sent for Cell Count, Microbiology (Cultures/Viral/Fungal), and Cytology.    EBUS-Findings  Indications: Diagnostic  Technique:  All lymph node stations were assessed. Only those 5 mm or greater in short axis were sampled.    Lymph node sizing was performed by EBUS and sampling by transbronchial needle aspiration was performed using 22-gauge Needle and 19-gauge Needle.    Lymph Nodes/Sites Inspected: 7 (subcarinal) node  10L lymph node  11Rs lymph node  11Ri lymph node  11L lymph node    Overall ROSE Diagnosis: Suggestive of benign-appearing lymphoid tissue    No immediate complications      Lymph Nodes Sampled:  Site 1: The 11L lymph node was => 10 mm on CT and Metabolic  activity unknown or PET-CT scan unavailable. The lymph node was photographed. The site was sampled.. 8 endobronchial ultrasound guided transbronchial biopsies were performed with samples obtained.   Preliminary ROSE Cytology was reported as adequate and suggestive of Suggestive of benign-appearing lymphoid tissue. Final results are pending.     Site 2: The 7 (subcarinal) node was < 10 mm on CT  and Metabolic activity unknown or PET-CT scan unavailable. The lymph node was photographed. The site was sampled.. 4  endobronchial ultrasound guided transbronchial biopsies were performed with samples obtained.   Preliminary ROSE Cytology was reported as adequate and suggestive of Suggestive of benign-appearing lymphoid tissue. Final results are pending.    Site 3: The 11Rs lymph node was => 10 mm on CT and Metabolic activity unknown or PET-CT scan unavailable. The lymph node was photographed. The site was sampled.. 4 endobronchial ultrasound guided transbronchial biopsies were performed with samples obtained.   Preliminary ROSE Cytology was reported as adequate and suggestive of Suggestive of benign-appearing lymphoid tissue. Final results are pending.    The ultrasound elastography mode was used to identify different densities in the mediastinum and lymph nodes (station 11L, 10L, 7, 11Rs, 11Ri).  Each site biopsied, as noted above, was selected and parts of each lymph node were targeted based upon the elastography findings. The elastography interpretation and use to target specific sites within the target lymph nodes was separate from the typical interpretation of linear EBUS ultrasound images. Pictures/Videos were saved.                                    The patient tolerated the procedure well.  There were no immediate complications.  At the conclusion of the operation, the patient was extubated in the operating room and transported to the recovery room in stable condition.     SPECIMEN(S):   11L TBNA, TBCBX   11R, 7 TBNA    IMPRESSION/PLAN: Henry Norman is a 58 year old-year-old male who presents for bronchoscopy for lymphadenopathy.  - f/u in clinic for results    Zachary Adelita Shoe, MD, PhD  03/27/2024

## 2024-03-27 NOTE — Anesthesia Postprocedure Evaluation (Signed)
 Anesthesia Post Note    Patient: Henry Norman    Procedure(s) Performed: Procedure(s):  BRONCHOSCOPY, WITH ENDOBRONCHIAL ULTRASOUND (EBUS) GUIDED SAMPLING WITH POSSIBLE CRYOBIOPSY OF LYMPH NODES      Final anesthesia type: General    Patient location: PACU    Post anesthesia pain: adequate analgesia    Mental status: awake, alert , and oriented    Airway Patent: Yes    Last Vitals:   Vitals Value Taken Time   BP 115/78 03/27/24 1430   Temp 35.8 C 03/27/24 1430   Pulse 61 03/27/24 1434   Resp 12 03/27/24 1434   SpO2 100 % 03/27/24 1434   Vitals shown include unfiled device data.     Post vital signs: stable    Hydration: adequate    N/V:no    Anesthetic complications: no    Plan of care per primary team.

## 2024-03-27 NOTE — Plan of Care (Signed)
 Problem: Promotion of Perioperative Health and Safety  Goal: Promotion of Health and Safety of the Perioperative Patient  Description: The patient remains safe, receives treatment appropriate to the surgical intervention and patient's physiological needs and is discharged or transferred to the appropriate level of care.Information below is the current care plan.  03/27/2024 1406 by Jenetta Mighty Lugod, RN  Flowsheets  Taken 03/27/2024 1400  Patient /Family stated Goal: to recover and go home  Taken 03/27/2024 1348  Guidelines: PACU  Individualized Interventions/Recommendations #1: stabilize vs  Individualized Interventions/Recommendations #2 (if applicable): control pain  Individualized Interventions/Recommendations #3 (if applicable): comfort measures  03/27/2024 1402 by Jenetta, Jelene Albano Lugod, RN  Flowsheets (Taken 03/27/2024 1348)  Guidelines: PACU  Individualized Interventions/Recommendations #1: stabilize vs  Individualized Interventions/Recommendations #2 (if applicable): control pain  Individualized Interventions/Recommendations #3 (if applicable): comfort measures

## 2024-03-28 DIAGNOSIS — I498 Other specified cardiac arrhythmias: Secondary | ICD-10-CM

## 2024-03-28 DIAGNOSIS — R001 Bradycardia, unspecified: Secondary | ICD-10-CM

## 2024-03-28 LAB — ECG 12-LEAD
ATRIAL RATE: 56 {beats}/min
P AXIS: 46 degrees
PR INTERVAL: 170 ms
QRS INTERVAL/DURATION: 112 ms
QT: 418 ms
QTc (Bazett): 403 ms
QTc (Fredericia): 408 ms
R AXIS: -18 degrees
T AXIS: 26 degrees
VENTRICULAR RATE: 56 {beats}/min

## 2024-03-28 LAB — ASPERGILLUS GALACTOMANNAN AG, BRONCH WASH: Aspergillus Galactomannan Ag, Bronch Wash: NEGATIVE

## 2024-03-28 LAB — PNEUMOCYSTIS JIROVECII NUCLEIC ACID AMPLIFICATION TEST: Pneumocystis jirovecii Nucleic Acid Amplification Test: NOT DETECTED

## 2024-03-29 LAB — M. TUBERCULOSIS AND RIFAMPIN RESISTANCE PCR: M. tuberculosis PCR: NOT DETECTED

## 2024-03-30 LAB — QUANT BRONCH CULTURE W/GRAM STAIN
Gram Stain Result: NONE SEEN
Quant Bronch Wash Culture Result: <10000

## 2024-04-02 LAB — LYMPHOMA FLOW CYTOMETRY PANEL

## 2024-04-03 LAB — TISSUE CULTURE W/GRAM STAIN, AEROBIC AND ANAEROBIC

## 2024-04-18 ENCOUNTER — Ambulatory Visit: Admitting: Bariatrics

## 2024-04-20 ENCOUNTER — Encounter: Payer: Self-pay | Admitting: Internal Medicine

## 2024-04-20 NOTE — Interdisciplinary (Signed)
 New General Pulm Pre-Visit Planning.    [x]  Reconciled medications, allergies, immunizations, and Care Everywhere records.   [x]  Pulmonary Function Test (PFT) completed and available in Epic.  [x]  CT Chest images completed and available in Epic.  []  External records obtained and reviewed, including the referral note.   [x]  Pended all necessary orders.     Harlene Jenkins Nida, April 20, 2024 8:53 AM    Injection/Immunization/Medication Administration Documentation    Patients Name: Henry Norman   Patients Date of Birth: [birth year]   Age: 58 year old     For Immunizations only:    The patient was given a Vaccine Information Statement (CDC VIS) with the most up to date publication date Yes/No: Yes.    2 Patient Identifiers verified.  Before administration, the dose, medication, and physician's order were double-checked.  Patient allergies and any special considerations were thoroughly reviewed and validated before proceeding.    The patient's medical record has been updated to reflect the administration of the immunization(s). Prior to administration, the patient was given a Vaccine Information Statement (VIS) to review.    The patient tolerated the procedure well and received appropriate discharge information.    Harlene Jenkins Nida  Signature Generated from [employer], May 03, 2024, 1:40 PM.

## 2024-04-23 ENCOUNTER — Ambulatory Visit: Attending: Pulmonary Medicine | Admitting: Pulmonary Medicine

## 2024-04-23 DIAGNOSIS — R590 Localized enlarged lymph nodes: Secondary | ICD-10-CM

## 2024-04-23 NOTE — Progress Notes (Signed)
 ---------------------(data below generated by Sula Lyle Lorriane Bonnita, MD)--------------------     Patient Verification & Telemedicine Consent & Financial Waiver:    1.   Identity: I have verified this patient's identity to be accurate.  2.   Consent: I verify consent has been secured in one of the following methods: (a) obtained written/ online attestation consent (via MyChartVideoVisit pathway), (b) the spoke-side provider has obtained verbal or written consent from patient/surrogate (if this is a provider to provider evaluation), or (c) in all other cases, I have personally obtained verbal consent from the patient/ surrogate (noting all elements below) to perform this voluntary telemedicine evaluation (including obtaining history, performing examination and reviewing data provided by the patient).   The patient/ surrogate has the right to refuse this evaluation.  I have explained risks (including potential loss of confidentiality), benefits, alternatives, and the potential need for subsequent face to face care. Patient/ surrogate understands that there is a risk of medical inaccuracies given that our recommendations will be made based on reported data (and we must therefore assume this information is accurate).  Knowing that there is a risk that this information is not reported accurately, and that the telemedicine video, audio, or data feed may be incomplete, the patient agrees to proceed with evaluation and holds us  harmless knowing these risks.  3.   Healthcare Team: The patient/ surrogate has been notified that other healthcare professionals (including students, residents and Engineer, maintenance) may be involved in this audio-video evaluation.   All laws concerning confidentiality and patient access to medical records and copies of medical records apply to telemedicine.  4.   Privacy: If this is a Restaurant manager, fast food Visit, the patient/ surrogate has received the [place] Notice of Privacy Practices via E-Checkin  process.  For all other video visit techniques, I have verbally provided the patient/ surrogate with the Port  web link in [place] (https://health.dDotCom.si.aspx) or Spanish (https://health.LavishToys.ch.aspx).  The patient/ surrogate acknowledges both being provided the NPP link, and has been offered to have the NPP mailed to the patient/ surrogate by [place]  mail.  The patient/ surrogate has voiced understanding an acknowledgement of receipt of this NPP web address.  If the patient/surrogate has elected to receive the NPP via US  mail, I verify that the NPP will be sent promptly to the patient/surrogate via [place]  mail.  5.   Capacity: I have reviewed this above verification and consent paragraph with the patient/ surrogate and the patient is capacitated or has a surrogate. If the patient is not capacitated to understand the above, and no surrogate is available, since this is not an emergency evaluation, the visit will be rescheduled until such time that the patient can consent, or the surrogate is available to consent. If this is an emergency evaluation and the patient is not capacitated to understand the above, and no surrogate is available, I am proceeding with this evaluation as this is felt to be an emergency setting and no appropriate specialist is available at the bedside to perform these evaluations.  6.   Financial Waiver: If this is a Restaurant manager, fast food Visit, the patient has been made aware of the financial waiver via E-Checkin process.  For all other video visit techniques, an E-Checkin process is not performed.  As such, I have personally verbally informed the patient/ surrogate that this evaluation will be a billable encounter similar to an in-person clinic visit, and the patient/ surrogate has agreed to pay the fee for services rendered.  If we are billing insurance for  the patient's telehealth visit, his out-of-pocket cost will be determined based on his plan and will be billed to  him.    7.   Intra-State Location: The patient/ surrogate attests to understanding that if the patient accesses these services from a location outside of [place] , that the patient does so at the patient's own risk and initiative and that the patient is ultimately responsible for compliance with any laws or regulations associated with the patient's use.  8.   Specific Use:The patient/ surrogate understands that [facility] makes no representation that materials or servicesdelivered via telecommunication services, or listed on telemedicine websites, are appropriate or available for use in any other location.           Demographics:  Medical Record #: [id number]  Date: April 23, 2024  Patient Name: Henry Norman  DOB: [birth year]  Age: 58 year old  Sex: male  Location: Home address on file  Patient seen Status: Patient was evaluated via telemedicine (audio or audio/video)     Evaluator(s):  Henry Norman was evaluated by me today.    Clinic Location: [facility]  [facility]  [address] [place] [address]          REFERRING PHYSICIAN   Referring Provider:  Elvin Sober, NP  [address]  [address]  [place],  [place] [address]    Primary Care Provider:  Valera Starring  [address]  [facility] [place] [address]    PATIENT IDENTIFICATION   Henry Norman is a 58 year old male with hilar lymphadenopathy seen in consultation at the request of Dr. Lorriane Dines.    CHIEF COMPLAINT / HISTORY OF PRESENT ILLNESS   Henry Norman is a 58 year old male hx HLD, prior tobacco (20PY, quit 2007), OSA not on CPAP use who is evaluated for mLAD.     Met with Dr. Chapman from general pulmonary on 02/09/24 and noted rapid weight loss despite good appetite. Chest imaging findings of hilar adenopathy were noted and he was referred for urgent EBUS.   Underwent bronch with EBUS TBNA/TBCBx on 03/27/24 and presents for post-bronch f/u.      Presents today reporting no issues with recovery after  the bronch. Mild cough and sore throat afterwards but went away within 1-2days. Feels back to his baseline.   Denies ongoing weight loss, it has stabilized at this point.     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      Family Hx:   - lung ca: unknown (adopted)   - lung disease: unknown (adopted)       Evaluation to date has also included CT Chest             INTERVENTIONAL PULMONOLOGY PERTINENT HISTORY     Social History     Tobacco Use   Smoking Status Former    Current packs/day: 0.00    Types: Cigarettes    Quit date: 2006    Years since quitting: 19.5   Smokeless Tobacco Former        IP PROCEDURES:  - 03/27/24: bronch with EBUS TBNA/TBCBx      PAST HISTORY     Probelms   Patient  Active Problem List    Diagnosis Date Noted    Hilar adenopathy 02/27/2024    Former smoker 01/11/2024    Shortness of breath 01/11/2024    Rapid weight loss 01/11/2024    Hyperlipidemia, unspecified hyperlipidemia type 01/11/2024    Encounter for screening prostate specific antigen (PSA) measurement 01/11/2024    Screening for endocrine, nutritional, metabolic and immunity disorder 03/21/2023    Chronic pain of left knee 03/21/2023    Need for vaccination 03/21/2023    Dermatitis 11/05/2020    Screening for colon cancer 11/05/2020    Shoulder pain, unspecified chronicity, unspecified laterality 08/29/2017    Encounter for routine adult health examination without abnormal findings 07/04/2017    Cervical radiculitis 07/17/2010          Past Medical History   Past Medical History:   Diagnosis Date    Preoperative clearance 08/29/2017    Screening for colon cancer 11/05/2020    Screening for endocrine, metabolic and immunity disorder 11/05/2020         Past Surgical History   Past Surgical History:   Procedure Laterality Date    KNEE SURGERY Left  09/27/2004    Meniscus Tear    KNEE SURGERY Left 09/27/1986    Torn ACL    SHOULDER SURGERY Left     labrum tear         Family History   Family History   Family history unknown: Yes         Social History   Social History     Tobacco Use    Smoking status: Former     Current packs/day: 0.00     Types: Cigarettes     Quit date: 2006     Years since quitting: 19.5    Smokeless tobacco: Former   Substance Use Topics    Alcohol use: No     Social History     Social History Narrative    Not on file        ALLERGIES     Allergies   Allergen Reactions    Fluoxetine Palpitations    Paroxetine Palpitations       HOME MEDICATIONS     Prior to Admission medications    Medication Sig Start Date End Date Taking? Authorizing Provider   budesonide -formoterol  (SYMBICORT ) 80-4.5 MCG/ACT inhaler inhale 2 puffs by mouth every 12 hours 02/07/24   Elvin Sober, NP   hydrOXYzine  HCL (ATARAX ) 25 MG tablet Take 1 tablet (25 mg) by mouth 2 times daily.    Historical, Documentation   mirtazapine  (REMERON ) 15 MG tablet Take 1 tablet (15 mg) by mouth nightly. 07/02/17   Historical, Documentation          REVIEW OF SYSTEMS   ROS neg except as per HPI     PHYSICAL EXAMINATION   There were no vitals taken for this visit.            Physical Exam  Gen: conversant, NAD, non-toxic  HEENT: EOMI, anicteric  Resp: no increased WOB  Neuro: AAOx4, no focal deficits       LABS/STUDIES   All studies were reviewed personally     CBC:  BMP:   Lab Results   Component Value Date    WBC 6.8 01/11/2024    WBC 5.6 04/29/2023    WBC 5.3 07/09/2017    HGB 17.2 (H) 01/11/2024    HGB 17.5 (H) 04/29/2023    HGB 17.5 (H) 07/09/2017    HCT  51.8 (H) 01/11/2024    HCT 50.8 (H) 04/29/2023    HCT 50.4 (H) 07/09/2017    PLT 261 01/11/2024    PLT 221 04/29/2023    PLT 218 07/09/2017    SEG 60.9 01/11/2024    SEG 63.4 04/29/2023    SEG 60.5 07/09/2017    LYMPHS 29.9 01/11/2024    LYMPHS 28.5 04/29/2023    LYMPHS 29.8 07/09/2017    MONOS 7.2 01/11/2024    MONOS 6.0 04/29/2023     MONOS 6.7 07/09/2017     Lab Results   Component Value Date    NA 138 01/11/2024    NA 137 04/29/2023    NA 139 07/09/2017    K 4.9 01/11/2024    K 4.5 04/29/2023    K 5.4 (H) 07/09/2017    CL 100 01/11/2024    CL 102 04/29/2023    CL 103 07/09/2017    BICARB 33 (H) 01/11/2024    BICARB 27 04/29/2023    BICARB 28 07/09/2017    BUN 17 01/11/2024    BUN 12 04/29/2023    BUN 12 07/09/2017    CREAT 1.03 01/11/2024    CREAT 0.92 04/29/2023    CREAT 0.94 07/09/2017    GLU 89 01/11/2024    GLU 84 04/29/2023    GLU 93 07/09/2017    CA 10.0 01/11/2024    CA 9.5 04/29/2023    CA 9.7 07/09/2017        Coags:  LFTs:   No results found for: PTT, INR, PT   Lab Results   Component Value Date    ALK 59 01/11/2024    ALK 55 04/29/2023    ALK 56 07/09/2017    AST 17 01/11/2024    AST 20 04/29/2023    AST 18 07/09/2017    ALT 15 01/11/2024    ALT 22 04/29/2023    ALT 17 07/09/2017    TBILI 0.6 01/11/2024    TBILI 0.7 04/29/2023    TBILI 0.7 07/09/2017    TP 7.1 01/11/2024    TP 6.8 04/29/2023    TP 7.3 07/09/2017    ALB 4.8 01/11/2024    ALB 4.5 04/29/2023    ALB 4.5 07/09/2017        ABG   No results found for: ARTPH, ARTPCO2, ARTPO2, ARTHC03, ARTBE, ARTO2SAT        Microbiology:    No results found for: BLOODCULT, FUNGALBC, AFBBACTCULT, URINECULTURE, QTFERON, QUANTIFERON, CRYPTOAG, CRYPTOCAGCSF, COCCICF, COCCICFCSF, COCCIIMMDIIF, COCCIIMMCSF, HISTOAGUR, CMVDNAQT, CMVDNAQTCSF    No results found for this or any previous visit.      Imaging:   Last Chest X-ray Results (up to 3)       X-Ray Chest Single View  Exam End: 03/27/2024  2:11 PM (Final result)   Impression: IMPRESSION:  Patchy bilateral pulmonary opacities favored to represent post bronchoscopic changes and subsegmental left basilar atelectasis.SABRA No pneumothorax. No large pleural effusion. Stable cardiomediastinal silhouette.                   Last CT Chest Results (up to 3)    No resulted procedures found.       Last PET  Result    No resulted procedures found.         CT Chest 01/12/24:         Outside CT report 01/12/24 - raw imaging not yet uploaded   EXAM: CT CHEST AND ABDOMEN WITHOUT AND WITH CONTRAST  HISTORY: Abnormal weight loss     TECHNIQUE: Contiguous axial images of the chest, abdomen were obtained utilizing a multislice, multidetector CT scanner with contrast. Post-processed reformations were also submitted for review. The images were obtained following the administration of IV    contrast. Oral contrast provided.     Contrast: 90 cc Omnipaque 350 IV contrast.     The total DLP was 2868.7 mGy-cm and the CTDI was 16.84 mGy. Low dose protocols were performed.     One or more of the following dose reduction techniques were used: automated exposure control, adjustment of the mA and/or kV according to patient size, use of iterative reconstruction technique. A total of 0 CT (Computed Tomography) examinations and 0   myocardial perfusion studies have been performed on this patient over the past 12 months. Counts as indicated include examinations performed within our network.     COMPARISON: Chest radiograph same day     FINDINGS:     CHEST   Vasculature: Normal size thoracic aorta.   Lymph Nodes: 1.4 cm x 2 cm right hilar lymph node short axis (3-52).   1.4 cm x 1.4 cm right infrahilar lymph node (3-64).   1.4 cm x 1.1 cm left infrahilar lymph node (3-58).   Mediastinum: Heart is normal in size. No pericardial effusion. Minimal soft tissue haziness in the anterior mediastinum. Attention on follow-up.   Atherosclerotic calcification of the coronary arteries.   Atherosclerotic calcification of the aorta.   Lungs/airway: No focal consolidation, pneumothorax, nor large pleural effusion.   4 mm pulmonary nodule left lung apex (4-22).   Dependent compressive atelectasis.   Emphysema, most pronounced in the upper lobes.   Trachea and mainstem bronchi are patent.   Musculoskeletal: No acute osseous abnormality.     ABDOMEN AND PELVIS    Liver: 1.2 cm hypoattenuated lesion left hepatic lobe located centrally, resembles a cyst. Small size limits assessment.   Gallbladder/biliary tree: Unremarkable.   Spleen: Unremarkable.   Pancreas: Unremarkable.    Adrenal Glands: Unremarkable.   Kidneys: No hydronephrosis.  No discrete calyceal stone. Symmetric enhancement of the parenchyma. Urinary bladder grossly unremarkable for degree of distention.   Bowel: No evidence of bowel obstruction. No acute bowel inflammation. No free air. No significant free fluid.  Colonic diverticula. Partially visualized appendix appear unremarkable.   Lymphadenopathy: No bulky lymphadenopathy.   Abdominal Wall and Mesentery: Small umbilical fat filled hernia.   Vasculature: Aorta normal in caliber. Atherosclerosis.   Musculoskeletal: No acute osseous abnormality.     IMPRESSION:   1. Enlarged hilar and infrahilar lymph nodes. Correlate with patient's symptomatology and laboratory workup. Attention on follow-up.   2.  Minimal soft tissue haziness in the anterior mediastinum. Attention on follow-up.   3.  No suspicious pulmonary nodule.   4.  Emphysema.   5.  Probable small cyst in the liver. Attention on follow-up.   6.  Additional findings above.       Echo:      PFTs:         No data to display                     IMPRESSION   Henry Norman is a 58 year old male with a history of HLD, prior tobacco (20PY, quit 2007), OSA not on CPAP use who is evaluated for mLAD.     Patient largely denies respiratory or systemic symptoms but has endorsed a 20lb weight loss. He admits to  lifestyle changes around the time of this but the degree of weight loss seems out of proportion. As part of his work up he had CT Chest which shows hilar LAD without other obvious abnormalities.   He underwent bronchoscopy with EBUS biopsy of lymph nodes showing normal lymphoid tissue and anthracosis of the LN's, flow cytometry normal. Tissue culture with strep parasanguinis but likely contaminant.     We  discussed the reassuring nature of these results as they reflect benign inflammation without cause for concern. Patient had several questions re: nocturnal hypoxia with apneic episodes but has already undergone sleep testing with general pulmonary and is scheduled to see them early August.     PLAN   - f/u general pulmonology   - return to IP clinic prn       I personally reviewed the radiographic studies and evaluated the patient.   I discussed the plan above with the patient.  Henry Norman expressed an understanding of this discussion and recommendations above.  All questions were answered to patient's satisfaction.  The patient has my contact information, and understands to call with any additional questions or concerns.    I personally spent total 43 minutes in face-to-face and non-face-to-face activities related to the patient's visit today, excluding any separately reportable services/procedures.    I am seeing this patient for ongoing, long-term management of their chronic pulmonary condition (intrathoracic lymphadenopathy).  This is a chronic complex medical condition for which I have been managing longitudinally.       Sula RONAL Iva Bonnita, MD  Interventional Pulmonology   LPA# [phone]        CC:  Elvin Sober, NP  [address]  [address]  [place],  [place] [address]  Patient Care Team:  Valera Starring, MD as PCP - [facility])

## 2024-04-25 ENCOUNTER — Encounter (INDEPENDENT_AMBULATORY_CARE_PROVIDER_SITE_OTHER): Payer: Self-pay | Admitting: Pulmonary Medicine

## 2024-04-30 LAB — FUNGAL CULTURE: Fungus Culture Result: NO GROWTH

## 2024-05-02 ENCOUNTER — Encounter (INDEPENDENT_AMBULATORY_CARE_PROVIDER_SITE_OTHER): Payer: Self-pay

## 2024-05-02 NOTE — Progress Notes (Unsigned)
 [facility] Note    Patient ID:  58 year old male hx HLD, prior tobacco (20PY, quit 2007), OSA not on CPAP use who is evaluated for mLAD.     History:  Patient presenting for nocturnal desats. No questions in regards to IP procedures and results. About 10 years ago had a sleep study diagnosed with sleep apnea and was given a CPAP. Did not use at the time. Has significant daytime sleepiness. At the time did not think he needed it but noticed the increase in sleepiness. Otherwise feeling well. No history of COPD/asthma but history of bronchitis x2. No allergies. Some cough likely PND.    Baseline: No limitations on walking     Last visit:  EBUS TBNA/TBCBx on 03/27/24 after CT noted hilar adenopathy and unintentional weight loss.     Current inhaler regimen: None  Other pertinent medications: None    Famhx:  Adopted, no family history available    Social:  Lives at home with wife and two kids  Two dogs   Quit 18 years ago smoked < 0.75 packs a day for 22 years   Marijuana- 25 years ago   ETOH quit 25 years ago  Administrator, sports fitter, exposures to welding/smoke/asbestos  No down feathers in clothing/blanket/pillow  No wood burning stove  Born [place] raised in [place]      ROS:  14-point ROS completed and negative other than above    Past Medical/Surgical Hx:  Past Medical History:   Diagnosis Date    Preoperative clearance 08/29/2017    Screening for colon cancer 11/05/2020    Screening for endocrine, metabolic and immunity disorder 11/05/2020       Family Hx:  Family History   Family history unknown: Yes       Social Hx:  Social History     Socioeconomic History    Marital status: Married     Spouse name: Not on file    Number of children: Not on file    Years of education: Not on file    Highest education level: Not on file   Occupational History    Not on file   Tobacco Use    Smoking status: Former     Current packs/day: 0.00     Types: Cigarettes     Quit date: 2006     Years since quitting: 19.6     Smokeless tobacco: Former   Substance and Sexual Activity    Alcohol use: No    Drug use: Not on file    Sexual activity: Not on file   Other Topics Concern    Not on file   Social History Narrative    Not on file     Social Drivers of Health     Financial Resource Strain: Not on file   Food Insecurity: Not on file   Transportation Needs: Not on file   Physical Activity: Not on file   Stress: Not on file   Social Connections: Unknown (03/02/2023)    Received from [facility] and [facility]    [facility] Social Connections     How often do you have someone you trust whom you can talk with about your problems, your worries, or yourself?: Not on file   Intimate Partner Violence: Not on file   Housing Stability: Not on file         Home Medications:  Current Outpatient Medications on File Prior to Visit   Medication Sig Dispense  Refill    hydrOXYzine  HCL (ATARAX ) 25 MG tablet Take 1 tablet (25 mg) by mouth 2 times daily.      mirtazapine  (REMERON ) 15 MG tablet Take 1 tablet (15 mg) by mouth nightly.  0     No current facility-administered medications on file prior to visit.       Allergies:  Allergies   Allergen Reactions    Fluoxetine Palpitations    Paroxetine Palpitations       Objective:  There were no vitals filed for this visit.    Physical Exam:  GEN: NAD, alert  HEENT: NC/AT, anicteric sclera, OP clear  CARDS: Clear S1/S2, RR, no m/r/g  RESP: CTAB, nlb, symmetrical expansion  ABD: Soft, NT/ND  EXT: No LE edema  NEURO: A&Ox4, non-focal    Pertinent Lab Studies: Reviewed.  11L, Cryo:   -Fragments of anthracotic lymph node.   -No evidence of carcinoma.     11L FNA:  Lymphoid Tissue     11L FNA:  Lymphoid Tissue with marked anthracotic laden macropahges     7, FNA  Lymphoid Tissue     RML, Bronch wash  No Atypical or Malignant Cells   No Pneumocystis jiroveci identified   Silver stain(s) negative for fungal organism.     Station 11R/7 (?)  Targeted flow cytometric analysis of FNA shows no  monotypic B-cells. The        T-cells show normal CD4:CD8 ratio at 3.3:1. Please note, some hematologic       neoplasms including but not limited to classical Hodgkin lymphoma, T-cell /     histiocyte-rich large B-cell lymphoma, nodular lymphocyte predominant Hodgkin   lymphoma, or non-hematolymphoid neoplasms cannot be ruled out by flow           cytometry. Clinical and morphologic correlation is needed for final diagnosis.       Micro Data: Reviewed.  Microbiology Results (last 30 days)       ** No results found for the last 720 hours. **          Bronch PCR negative  FNA culture: Strep Parasanguinis  Fungal cx negative  Bronch culture negative  AFB pending x2    Imaging: Reviewed.  Last Echo Result    No resulted procedures found.       Last Chest X-ray Result       X-Ray Chest Single View  Exam End: 03/27/2024  2:11 PM (Final result)   Impression: IMPRESSION:  Patchy bilateral pulmonary opacities favored to represent post bronchoscopic changes and subsegmental left basilar atelectasis.SABRA No pneumothorax. No large pleural effusion. Stable cardiomediastinal silhouette.                   Last CT Chest Result    No resulted procedures found.         Cardiopulmonary Testing: Reviewed.  OSH   CT Chest/abd W/WO  [id number]  IMPRESSION:     1. Enlarged hilar and infrahilar lymph nodes. Correlate with patient's symptomatology and laboratory workup. Attention on follow-up.   2.  Minimal soft tissue haziness in the anterior mediastinum. Attention on follow-up.   3.  No suspicious pulmonary nodule.   4.  Emphysema.   5.  Probable small cyst in the liver. Attention on follow-up.   6.  Additional findings above.       PFT: only able to see FEV 116% with restriction     Sleep Testing: Reviewed.  [id number]  REI: 18.4  Sat nadir: 81%  Assessment/Plan:  58 year old male hx HLD, prior tobacco (20PY, quit 2007), OSA not on CPAP use who is evaluated for mLAD.     # Perihilar LAD  # Emphysema  # OSA; Moderate  # PND   # Interstitial  abnormalities  Seen by IP and results discussed. In short, benign findings/inflammation. CT done at [facility]. Overall no respiratory limitations. Can hold on inhalers for now but discuss return precautions and symptoms to look out for. For his interstitial abnormalities will obtain repeat CT With ILD. Will send to sleep clinic to establish care but DME placed for CPAP. Flonase  for PND   - PFT; repeat pending  - f/u bronch AFB cultures  - Sleep clinic referral  --- CPAP ordered clinic  - repeat CT Chest in 2 years or earlier for interstitial abnormalities   - Flonase   - Hold on inhalers  - PCV20 today    Patient was seen and discussed with attending physician, Dr Sung.      Toribio Ruth   PCCM Fellow

## 2024-05-03 ENCOUNTER — Ambulatory Visit (INDEPENDENT_AMBULATORY_CARE_PROVIDER_SITE_OTHER)

## 2024-05-03 ENCOUNTER — Encounter (INDEPENDENT_AMBULATORY_CARE_PROVIDER_SITE_OTHER): Payer: Self-pay

## 2024-05-03 VITALS — BP 142/84 | HR 75 | Temp 97.8°F | Resp 20 | Ht 71.0 in | Wt 223.0 lb

## 2024-05-03 DIAGNOSIS — Z23 Encounter for immunization: Secondary | ICD-10-CM

## 2024-05-03 DIAGNOSIS — R918 Other nonspecific abnormal finding of lung field: Secondary | ICD-10-CM | POA: Insufficient documentation

## 2024-05-03 DIAGNOSIS — J439 Emphysema, unspecified: Secondary | ICD-10-CM | POA: Insufficient documentation

## 2024-05-03 DIAGNOSIS — R0982 Postnasal drip: Secondary | ICD-10-CM

## 2024-05-03 DIAGNOSIS — G4733 Obstructive sleep apnea (adult) (pediatric): Secondary | ICD-10-CM

## 2024-05-03 DIAGNOSIS — R911 Solitary pulmonary nodule: Secondary | ICD-10-CM

## 2024-05-03 MED ORDER — FLUTICASONE PROPIONATE 50 MCG/ACT NA SUSP
1.0000 | Freq: Every day | NASAL | 2 refills | Status: DC
Start: 2024-05-03 — End: 2024-07-12

## 2024-05-03 NOTE — Progress Notes (Signed)
 [facility] ATTENDING ATTESTATION NOTE    I personally saw and examined Fairy KANDICE Ada with Dr. Jama on Thursday May 03, 2024. I agree with the history, physical examination, assessment and plan as outlined in the resident/fellows notes.     HPI:  58 yo with smoking history  Sleep test results  No nodules  LAD in left hilum, biopsy negative  Daytime sleepiness    Past Medical and Surgical History:  Past Medical History:   Diagnosis Date    Preoperative clearance 08/29/2017    Screening for colon cancer 11/05/2020    Screening for endocrine, metabolic and immunity disorder 11/05/2020     Past Surgical History:   Procedure Laterality Date    KNEE SURGERY Left 09/27/2004    Meniscus Tear    KNEE SURGERY Left 09/27/1986    Torn ACL    SHOULDER SURGERY Left     labrum tear       Allergies: Fluoxetine and Paroxetine.  Current Medications:   hydrOXYzine  HCL Take 1 tablet (25 mg) by mouth 2 times daily.      mirtazapine  Take 1 tablet (15 mg) by mouth nightly.  0       Family & Social History: As per resident/fellow note. Smoking: Quit smokin 22 years ago. Asbestosis exposure.     Vitals: BP 142/84 (BP Location: Left arm, BP Patient Position: Sitting, BP cuff size: Regular)   Pulse 75   Temp 97.8 F (36.6 C) (Temporal)   Resp 20   Ht 5' 11 (1.803 m)   Wt 101.2 kg (223 lb)   SpO2 93%   BMI 31.10 kg/m  Body mass index is 31.1 kg/m.  PE: Also see resident/fellow note.  General appearance: Pulido is seated comfortably in no respiratory distress, speaking full sentences with no stridor. Alert, orientated x3 and appropriate affect. The JVP flat. RRR no obvious murmurs. There is symmetrical chest expansion. CTAB. The abdomen is soft, NT/ND. There is no peripheral edema, clubbing or peripheral cyanosis.    Labs: Laboratory & Microbiology data were reviewed    03/27/24 hilar lymph node biopsy  Station 11 L, cryobiopsy:   -Fragments of anthracotic lymph node.   -No evidence of carcinoma.     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 Please refer to resident/fellows note for additional details. Follow up per note.    Hildegarde Dunaway, MD  Pulmonary and Critical Care Medicine  PID: [id number]  Pager: [phone]

## 2024-05-03 NOTE — Patient Instructions (Signed)
 [facility]    To do:  - CT chest in 2 years  - PFTs  - Please bring an up to date list of all your medications to every doctors appointment  - Please bring your inhalers to every pulmonary visit    [facility] services, phone numbers, and websites:  Website:    [email or web address]  For appointments:   [phone]    Other Phone Numbers:   General Pulmonary:     [phone]  Cystic Fibrosis:     [phone]  Interventional Pulmonology:    [phone]  Pulmonary Rehab:     [phone]   Pulmonary hypertension and CTEPH:  [phone]  Sleep Medicine:     [phone]    [facility]:  Locations and phone numbers: [email or web address]    [facility]:  Phone: [phone]   Website: [email or web address]    [facility]:  Phone: [phone]  Website: [email or web address]    [facility]:  Phone: [phone]  Website: [email or web address]    [facility]:  Your medications may be sent to [facility]. If you have any questions about your medications, please call the number below to discuss with a pharmacy staff member. Option for both free mail order as well as in-person pick up are available.  Address: [address] [place], [place] [address]  [facility] Phone: [phone]  Website: [email or web address]    [facility]:  Phone: [phone]  Website: [email or web address]    Instructions on how to use inhalers:  American Lung Association: https://figueroa.info/    This website can be helpful for tips on airway clearance techniques and for information on bronchiectasis: https://impact-be.com/     Learn more about your pulmonary disease here:  American Lung Association:  MissExecutive.com.ee  American Thoracic Society: AssistantPositions.pl    How to use a Nasal Steroid (Nasonex/Flonase ) or other Medication:        1. Blow nose gently to clear nostrils or do a sinus rinse if instructed.   2. Remove cap and shake spray bottle.  3. Press against the outside of one nostril with your finger to close off that nostril.  4. Insert spray nozzle into the other nostril, and aim the nozzle toward the BACK of the your head (DONT point up to the top of your head) and away from the nasal septum.   5. Spray into the nostril while sniffing gently (dont sniff hard or it will get pulled out of the nose). Depending on the dosing, another spray may be administered into the same nostril.  6. Repeat steps 3 through 5 for the other nostril.  7. After use, wipe the nozzle with a tissue and replace cap.

## 2024-05-07 ENCOUNTER — Encounter (INDEPENDENT_AMBULATORY_CARE_PROVIDER_SITE_OTHER): Payer: Self-pay | Admitting: Hospital

## 2024-05-11 ENCOUNTER — Ambulatory Visit: Admitting: Internal Medicine

## 2024-05-11 ENCOUNTER — Encounter (INDEPENDENT_AMBULATORY_CARE_PROVIDER_SITE_OTHER): Payer: Self-pay | Admitting: Hospital

## 2024-05-21 ENCOUNTER — Other Ambulatory Visit: Payer: Self-pay | Admitting: Internal Medicine

## 2024-05-21 ENCOUNTER — Encounter: Payer: Self-pay | Admitting: *Deleted

## 2024-05-22 ENCOUNTER — Encounter: Payer: 59 | Admitting: Adult Health

## 2024-05-22 ENCOUNTER — Encounter (INDEPENDENT_AMBULATORY_CARE_PROVIDER_SITE_OTHER): Payer: Self-pay

## 2024-05-22 ENCOUNTER — Other Ambulatory Visit (INDEPENDENT_AMBULATORY_CARE_PROVIDER_SITE_OTHER): Payer: Self-pay | Admitting: Family

## 2024-05-23 NOTE — Progress Notes (Signed)
 This encounter was created in error - please disregard.

## 2024-05-25 LAB — AFB CULTURE W/STAIN
AFB Culture Result: NO GROWTH
AFB Smear Result: NEGATIVE
AFB Smear Result: NEGATIVE

## 2024-06-04 ENCOUNTER — Other Ambulatory Visit: Payer: Self-pay | Admitting: Internal Medicine

## 2024-06-12 ENCOUNTER — Encounter (INDEPENDENT_AMBULATORY_CARE_PROVIDER_SITE_OTHER): Payer: Self-pay

## 2024-06-13 ENCOUNTER — Encounter (INDEPENDENT_AMBULATORY_CARE_PROVIDER_SITE_OTHER): Payer: Self-pay | Admitting: General Practice

## 2024-06-21 NOTE — Telephone Encounter (Unsigned)
 Copied from CRM #[id number]. Topic: General - Other >> Jun 21, 2024  8:33 AM Berneda FALCON wrote: Reason for CRM: Wife states that she is going to fax over a form basically stating that he has his physical which gives her a discount on her insurance at work. She states she needs this back by 9/30 (was out of town and they only just gave it to her).  Please get on the lookout for this fax.

## 2024-06-24 ENCOUNTER — Other Ambulatory Visit (INDEPENDENT_AMBULATORY_CARE_PROVIDER_SITE_OTHER): Payer: Self-pay

## 2024-06-24 DIAGNOSIS — R0982 Postnasal drip: Secondary | ICD-10-CM

## 2024-06-27 ENCOUNTER — Encounter (INDEPENDENT_AMBULATORY_CARE_PROVIDER_SITE_OTHER): Payer: Self-pay

## 2024-07-02 NOTE — Progress Notes (Addendum)
 PATIENT: Dylan Hatfield DOB: [birth year]  REASON FOR VISIT: follow up HISTORY FROM: patient   Virtual Visit via Video Note  I connected with Dylan Hatfield on 07/03/24  at  3:00 PM EDT by a video enabled telemedicine application located remotely at [facility] and verified that I am speaking with the correct person using two identifiers who was located at their own home in [place]    I discussed the limitations of evaluation and management by telemedicine and the availability of in person appointments. The patient expressed understanding and agreed to proceed.   PATIENT: Dylan Hatfield DOB: [birth year]  REASON FOR VISIT: follow up HISTORY FROM: patient  HISTORY OF PRESENT ILLNESS: Today 07/03/24  Dylan Hatfield is a 58 y.o. male with a history of OSA on CPAP. Returns today for follow-up.  Overall he is doing well.  He continues to find the CPAP beneficial.  Sometimes he does have dry mouth but he does try to adjust his humidity.  He currently has a fullface mask.  His download is below       REVIEW OF SYSTEMS: Out of a complete 14 system review of symptoms, the patient complains only of the following symptoms, and all other reviewed systems are negative.  ALLERGIES: No Known Allergies  HOME MEDICATIONS: Outpatient Medications Prior to Visit  Medication Sig Dispense Refill   acyclovir  (ZOVIRAX ) 400 MG tablet Take 1 tablet (400 mg total) by mouth 2 (two) times daily. 180 tablet 1   amLODipine  (NORVASC ) 5 MG tablet Take 1 tablet (5 mg total) by mouth daily. 90 tablet 0   cyclobenzaprine (FLEXERIL) 10 MG tablet Take 10 mg by mouth at bedtime.     HYDROcodone -acetaminophen  (NORCO) 10-325 MG tablet Take 1 tablet by mouth as needed.     ibuprofen  (ADVIL ) 200 MG tablet Take 200 mg by mouth every 6 (six) hours as needed.     meloxicam (MOBIC) 7.5 MG tablet Take 7.5 mg by mouth daily.     Multiple Vitamins-Minerals (MULTIVITAMIN ADULTS PO) Take by mouth  daily. TAKE ONE     Omega-3 Fatty Acids (FISH OIL PO) Take by mouth in the morning and at bedtime. ONE CAPSULE     tamsulosin  (FLOMAX ) 0.4 MG CAPS capsule Take 1 capsule (0.4 mg total) by mouth daily after supper. 90 capsule 2   No facility-administered medications prior to visit.    PAST MEDICAL HISTORY: Past Medical History:  Diagnosis Date   Arthritis    Chronic back pain    bilateral L5/S1 transforaminal lumbar epidural steroid injections, Dr. Myra   DDD (degenerative disc disease), lumbosacral    Hypertension    Lumbosacral spondylosis    Sleep apnea    on CPAP, dx ~ 2008     PAST SURGICAL HISTORY: Past Surgical History:  Procedure Laterality Date   COLONOSCOPY     POLYPECTOMY     VASECTOMY      FAMILY HISTORY: Family History  Problem Relation Age of Onset   High blood pressure Mother    Depression Mother    Anxiety disorder Mother    Lung cancer Father    Sleep apnea Father    Arrhythmia Maternal Aunt    Stroke Other        GM stroke 36   Breast cancer Other        GM   Diabetes Neg Hx    CAD Neg Hx    Colon cancer Neg Hx  Prostate cancer Neg Hx    Esophageal cancer Neg Hx    Rectal cancer Neg Hx    Stomach cancer Neg Hx    Colon polyps Neg Hx    Crohn's disease Neg Hx     SOCIAL HISTORY: Social History   Socioeconomic History   Marital status: Married    Spouse name: Not on file   Number of children: 2   Years of education: Not on file   Highest education level: Not on file  Occupational History   Occupation: Occupation-- Theatre manager: [employer]  Tobacco Use   Smoking status: Former    Passive exposure: Past (FATHER SMOKED)   Smokeless tobacco: Former   Tobacco comments:    1 ppd, quit 2003    Started ~ 1986    17 pack-year  Vaping Use   Vaping status: Never Used  Substance and Sexual Activity   Alcohol use: No   Drug use: No    Types: Hydrocodone    Sexual activity: Yes  Other Topics Concern    Not on file  Social History Narrative   Daughter: graduated from college   Son : graduated HS, works w/ father    Social Drivers of Corporate investment banker Strain: Not on Ship broker Insecurity: Not on file  Transportation Needs: Not on file  Physical Activity: Not on file  Stress: Not on file  Social Connections: Not on file  Intimate Partner Violence: Not on file      PHYSICAL EXAM Generalized: Well developed, in no acute distress   Neurological examination  Mentation: Alert oriented to time, place, history taking. Follows all commands speech and language fluent Cranial nerve II-XII: Facial symmetry noted.   DIAGNOSTIC DATA (LABS, IMAGING, TESTING) - I reviewed patient records, labs, notes, testing and imaging myself where available.  Lab Results  Component Value Date   WBC 7.3 12/28/2022   HGB 18.2 Repeated and verified X2. (HH) 12/28/2022   HCT 56.5 (H) 12/27/2023   MCV 87.2 12/28/2022   PLT 207.0 12/28/2022      Component Value Date/Time   NA 142 12/27/2023 0917   K 4.7 12/27/2023 0917   CL 102 12/27/2023 0917   CO2 22 12/27/2023 0917   GLUCOSE 110 (H) 12/27/2023 0917   GLUCOSE 100 (H) 12/28/2022 0905   BUN 15 12/27/2023 0917   CREATININE 0.93 12/27/2023 0917   CALCIUM 9.9 12/27/2023 0917   PROT 6.8 12/27/2023 0917   ALBUMIN 5.0 (H) 12/27/2023 0917   AST 21 12/27/2023 0917   ALT 30 12/27/2023 0917   ALKPHOS 52 12/27/2023 0917   BILITOT 1.2 12/27/2023 0917   GFRNONAA 86.09 12/10/2009 0941   GFRAA 94 12/05/2008 0753   Lab Results  Component Value Date   CHOL 192 12/27/2023   HDL 28 (L) 12/27/2023   LDLCALC 131 (H) 12/27/2023   LDLDIRECT 123.0 12/28/2022   TRIG 185 (H) 12/27/2023   CHOLHDL 6 12/28/2022   Lab Results  Component Value Date   HGBA1C 5.4 12/27/2023   Lab Results  Component Value Date   VITAMINB12 505 12/27/2023   Lab Results  Component Value Date   TSH 2.110 12/27/2023      ASSESSMENT AND PLAN 58 y.o. year old male   has a past medical history of Arthritis, Chronic back pain, DDD (degenerative disc disease), lumbosacral, Hypertension, Lumbosacral spondylosis, and Sleep apnea. here with:  OSA on CPAP  CPAP compliance excellent Residual  AHI is good Encouraged patient to continue using CPAP nightly and > 4 hours each night F/U in 1 year or sooner if needed     Duwaine Russell, MSN, NP-C 07/03/24, 3:27 PM [facility] [address] [place], [place] [address] [phone]  The patient's condition requires frequent monitoring and adjustments in the treatment plan, reflecting the ongoing complexity of care.  This provider is the continuing focal point for all needed services for this condition.

## 2024-07-03 ENCOUNTER — Encounter: Payer: Self-pay | Admitting: *Deleted

## 2024-07-03 ENCOUNTER — Telehealth: Admitting: Adult Health

## 2024-07-03 DIAGNOSIS — G4733 Obstructive sleep apnea (adult) (pediatric): Secondary | ICD-10-CM | POA: Diagnosis not present

## 2024-07-03 NOTE — Patient Instructions (Signed)
 Continue using CPAP nightly and greater than 4 hours each night If your symptoms worsen or you develop new symptoms please let us  know.

## 2024-07-20 ENCOUNTER — Encounter (INDEPENDENT_AMBULATORY_CARE_PROVIDER_SITE_OTHER): Payer: Self-pay

## 2024-07-20 DIAGNOSIS — G4733 Obstructive sleep apnea (adult) (pediatric): Secondary | ICD-10-CM

## 2024-08-08 ENCOUNTER — Encounter (INDEPENDENT_AMBULATORY_CARE_PROVIDER_SITE_OTHER): Payer: Self-pay

## 2024-08-20 ENCOUNTER — Encounter: Payer: Self-pay | Admitting: Adult Health

## 2024-08-20 DIAGNOSIS — G4733 Obstructive sleep apnea (adult) (pediatric): Secondary | ICD-10-CM

## 2024-08-31 ENCOUNTER — Other Ambulatory Visit: Payer: Self-pay | Admitting: Internal Medicine

## 2024-09-03 ENCOUNTER — Encounter (INDEPENDENT_AMBULATORY_CARE_PROVIDER_SITE_OTHER): Payer: Self-pay

## 2024-09-04 ENCOUNTER — Encounter (INDEPENDENT_AMBULATORY_CARE_PROVIDER_SITE_OTHER): Payer: Self-pay

## 2024-09-08 ENCOUNTER — Encounter (INDEPENDENT_AMBULATORY_CARE_PROVIDER_SITE_OTHER): Payer: Self-pay | Admitting: Family Medicine

## 2024-09-25 ENCOUNTER — Encounter (INDEPENDENT_AMBULATORY_CARE_PROVIDER_SITE_OTHER): Payer: Self-pay

## 2024-09-26 ENCOUNTER — Encounter (INDEPENDENT_AMBULATORY_CARE_PROVIDER_SITE_OTHER): Payer: Self-pay

## 2024-10-10 ENCOUNTER — Ambulatory Visit (INDEPENDENT_AMBULATORY_CARE_PROVIDER_SITE_OTHER): Admitting: Family Medicine

## 2024-10-10 VITALS — BP 122/86 | HR 78 | Temp 98.4°F | Ht 71.0 in | Wt 220.0 lb

## 2024-10-10 MED ORDER — TRIAMCINOLONE ACETONIDE 40 MG/ML IJ SUSP
40.0000 mg | Freq: Once | INTRAMUSCULAR | Status: AC
  Administered 2024-10-10: 40 mg via INTRA_ARTICULAR

## 2024-10-10 NOTE — Assessment & Plan Note (Signed)
 Stable  Kenalog  injection administered - 1 cc  Pt tolerated well  After care instructions given  Continue to monitor

## 2024-10-10 NOTE — Procedures (Signed)
 Left Joint Injection (Knee Injection)    Medications used in left knee injected: 2 ccs of 1% lidocaine  and 1 ccs of Kenalog  (40mg /ml)    Informed consent was obtained.     The patient was brought to the examination room and placed in a seated position.     The lower extremity was placed in a free-fall, gravity-dependent position, flexed 90 degrees at the knee.     The lateral area (arthroscopic port) to be injected was then cleansed thoroughly with an iodine swab and rubbing alcohol.     Next, using sterile technique, a 27 gauge, 1.5 inch-long needle was inserted through this port to the articulation of the knee.     There was no fluid on aspiration. The above medication solution was then gradually injected without difficulty. No paresthesias were reported by the patient. The needle was then withdrawn. No excessive bleeding occurred. The injection site was sterilely dressed. The patient tolerated the procedure well. There were no complications.     The patient was observed for an adequate period of time post injection. Thorough discharge instructions and precautions were reviewed with the patient. The patient was discharged home. The patient's pain was well-controlled and there were no neurological deficits.     I explained to the patient that he/she may have pain relief for the next several hours due to the anesthetic but that it could take 2-4 days for the steroid medication to begin working to provide adequate pain relief.     The patient was asked to contact us  if any questions arose, preferably through the patient portal.    Note: Injection code 20610/J3301 for each joint injected      Reviewed and Signed By Jacalyn Aye, MD

## 2024-10-10 NOTE — Progress Notes (Signed)
 Encounter Date:  10/10/2024   PCP: Valera Starring  MRN: [id number]  DOB: 01/17/1966     HPI:  Henry Norman, 59 year old male, presents   Chief Complaint   Patient presents with    Other     Pt req cortisone inj for L knee     C/o left knee pain which started to worsen 1 month ago. Last cortisone injection was administered 01/02/2024.       CURRENT  MEDICATIONS:  Medications Ordered Prior to Encounter[1]  No outpatient medications have been marked as taking for the 10/10/24 encounter (Office Visit) with Valera Starring, MD.     Current Facility-Administered Medications for the 10/10/24 encounter (Office Visit) with Valera Starring, MD   Medication Dose Route Frequency Provider Last Rate Last Admin    triamcinolone  acetonide (KENALOG -40) 40 MG/ML injection 40 mg  40 mg IntraARTICULAR Once Amyra Vantuyl, Tae-Woong, MD            ALLERGIES:    Allergies[2]       REVIEW OF SYSTEMS:  Review of Systems   Constitutional:  Negative for chills and fever.   Respiratory:  Negative for cough and shortness of breath.    Cardiovascular:  Negative for chest pain.   Gastrointestinal:  Negative for abdominal pain, diarrhea, nausea and vomiting.         PHYSICAL EXAM:   10/10/24  1642   BP: 122/86   Pulse: 78   Temp: 98.4 F (36.9 C)   SpO2: 96%     Body mass index is 30.68 kg/m.    Ht Readings from Last 1 Encounters:   10/10/24 5' 11 (1.803 m)     Wt Readings from Last 1 Encounters:   10/10/24 99.8 kg (220 lb)         Physical Exam  Vitals and nursing note reviewed. Exam conducted with a chaperone present.   Constitutional:       Appearance: Normal appearance.   Musculoskeletal:      Left knee: No swelling, deformity, effusion, erythema, ecchymosis, lacerations or crepitus. Normal range of motion. Tenderness present. Normal alignment, normal meniscus and normal patellar mobility.   Neurological:      Mental Status: He is alert and oriented to person, place, and time.   Psychiatric:         Mood and Affect: Mood normal.         Behavior: Behavior  normal.         Thought Content: Thought content normal.         Judgment: Judgment normal.           ASSESSMENT & PLAN:    Henry Norman was seen today for other.    Diagnoses and all orders for this visit:    Chronic pain of left knee  Assessment & Plan:  Stable  Kenalog  injection administered - 1 cc  Pt tolerated well  After care instructions given  Continue to monitor    Orders:  -     triamcinolone  acetonide (KENALOG -40) 40 MG/ML injection 40 mg  -     Injection / Drainage / Aspiration of Joint or Bursa          ICD-10-CM ICD-9-CM    1. Chronic pain of left knee  M25.562 719.46 triamcinolone  acetonide (KENALOG -40) 40 MG/ML injection 40 mg    G89.29 338.29 Injection / Drainage / Aspiration of Joint or Bursa            By signing below, I  acknowledge that I have reviewed the above note,  dictated by me and scribed by Rolando Hammonds for accuracy and edited  where necessary. The note accurately reflects the services provided at this  encounter. We retain the right to modify this information in the event of  errors. Occasional errors in punctuation, grammar and content may occur.        Electronically reviewed and signed by Jacalyn Aye, M.D.      [facility]  [email or web address]         [1]   Current Outpatient Medications on File Prior to Visit   Medication Sig Dispense Refill    [DISCONTINUED] fluticasone  propionate (FLONASE ) 50 MCG/ACT nasal spray SPRAY 1 SPRAY INTO EACH NOSTRIL EVERY DAY 24 mL 2    hydrOXYzine  HCL (ATARAX ) 25 MG tablet Take 1 tablet (25 mg) by mouth 2 times daily.      mirtazapine  (REMERON ) 15 MG tablet Take 1 tablet (15 mg) by mouth nightly.  0     No current facility-administered medications on file prior to visit.   [2]   Allergies  Allergen Reactions    Fluoxetine Palpitations    Paroxetine Palpitations

## 2024-12-31 ENCOUNTER — Encounter: Admitting: Internal Medicine

## 2025-03-21 ENCOUNTER — Encounter (INDEPENDENT_AMBULATORY_CARE_PROVIDER_SITE_OTHER)

## 2025-07-04 ENCOUNTER — Telehealth: Admitting: Adult Health
